# Patient Record
Sex: Female | Born: 1965
Health system: Southern US, Community
[De-identification: ages and names within clinical notes are randomized; demographics above are authoritative.]

## PROBLEM LIST (undated history)

## (undated) DIAGNOSIS — F419 Anxiety disorder, unspecified: Secondary | ICD-10-CM

## (undated) DIAGNOSIS — R112 Nausea with vomiting, unspecified: Secondary | ICD-10-CM

## (undated) DIAGNOSIS — F17200 Nicotine dependence, unspecified, uncomplicated: Secondary | ICD-10-CM

## (undated) DIAGNOSIS — N6310 Unspecified lump in the right breast, unspecified quadrant: Secondary | ICD-10-CM

## (undated) DIAGNOSIS — F319 Bipolar disorder, unspecified: Secondary | ICD-10-CM

## (undated) DIAGNOSIS — Z9889 Other specified postprocedural states: Secondary | ICD-10-CM

## (undated) DIAGNOSIS — E785 Hyperlipidemia, unspecified: Secondary | ICD-10-CM

## (undated) DIAGNOSIS — K219 Gastro-esophageal reflux disease without esophagitis: Secondary | ICD-10-CM

## (undated) HISTORY — DX: Anxiety disorder, unspecified: F41.9

## (undated) HISTORY — PX: LAPAROSCOPIC LYSIS OF ADHESIONS: SHX5905

## (undated) HISTORY — DX: Nausea with vomiting, unspecified: R11.2

## (undated) HISTORY — PX: ABDOMINAL HYSTERECTOMY: SHX81

## (undated) HISTORY — PX: CHOLECYSTECTOMY: SHX55

## (undated) HISTORY — PX: MYOMECTOMY: SHX85

## (undated) HISTORY — DX: Nicotine dependence, unspecified, uncomplicated: F17.200

## (undated) HISTORY — DX: Nausea with vomiting, unspecified: Z98.890

## (undated) HISTORY — PX: PLANTAR FASCIA SURGERY: SHX746

---

## 1998-11-01 ENCOUNTER — Other Ambulatory Visit: Admission: RE | Admit: 1998-11-01 | Discharge: 1998-11-01 | Payer: Self-pay | Admitting: *Deleted

## 1999-11-02 ENCOUNTER — Other Ambulatory Visit: Admission: RE | Admit: 1999-11-02 | Discharge: 1999-11-02 | Payer: Self-pay | Admitting: *Deleted

## 2000-03-22 ENCOUNTER — Emergency Department (HOSPITAL_COMMUNITY): Admission: EM | Admit: 2000-03-22 | Discharge: 2000-03-23 | Payer: Self-pay | Admitting: Emergency Medicine

## 2000-03-25 ENCOUNTER — Encounter: Payer: Self-pay | Admitting: Emergency Medicine

## 2000-03-25 ENCOUNTER — Ambulatory Visit (HOSPITAL_COMMUNITY): Admission: RE | Admit: 2000-03-25 | Discharge: 2000-03-25 | Payer: Self-pay | Admitting: Emergency Medicine

## 2000-04-10 ENCOUNTER — Encounter: Payer: Self-pay | Admitting: General Surgery

## 2000-04-11 ENCOUNTER — Observation Stay (HOSPITAL_COMMUNITY): Admission: RE | Admit: 2000-04-11 | Discharge: 2000-04-12 | Payer: Self-pay | Admitting: General Surgery

## 2000-04-11 ENCOUNTER — Encounter (INDEPENDENT_AMBULATORY_CARE_PROVIDER_SITE_OTHER): Payer: Self-pay | Admitting: Specialist

## 2000-11-07 ENCOUNTER — Encounter: Admission: RE | Admit: 2000-11-07 | Discharge: 2000-11-07 | Payer: Self-pay | Admitting: Internal Medicine

## 2000-11-07 ENCOUNTER — Encounter: Payer: Self-pay | Admitting: Internal Medicine

## 2000-11-11 ENCOUNTER — Ambulatory Visit (HOSPITAL_BASED_OUTPATIENT_CLINIC_OR_DEPARTMENT_OTHER): Admission: RE | Admit: 2000-11-11 | Discharge: 2000-11-11 | Payer: Self-pay | Admitting: Orthopaedic Surgery

## 2000-11-17 ENCOUNTER — Other Ambulatory Visit: Admission: RE | Admit: 2000-11-17 | Discharge: 2000-11-17 | Payer: Self-pay | Admitting: *Deleted

## 2000-11-18 ENCOUNTER — Encounter: Payer: Self-pay | Admitting: Internal Medicine

## 2000-11-18 ENCOUNTER — Encounter: Admission: RE | Admit: 2000-11-18 | Discharge: 2000-11-18 | Payer: Self-pay | Admitting: Internal Medicine

## 2001-10-22 ENCOUNTER — Other Ambulatory Visit: Admission: RE | Admit: 2001-10-22 | Discharge: 2001-10-22 | Payer: Self-pay | Admitting: *Deleted

## 2002-01-03 ENCOUNTER — Emergency Department (HOSPITAL_COMMUNITY): Admission: EM | Admit: 2002-01-03 | Discharge: 2002-01-03 | Payer: Self-pay | Admitting: Emergency Medicine

## 2002-02-05 ENCOUNTER — Ambulatory Visit (HOSPITAL_COMMUNITY): Admission: RE | Admit: 2002-02-05 | Discharge: 2002-02-05 | Payer: Self-pay | Admitting: *Deleted

## 2002-02-05 ENCOUNTER — Encounter (INDEPENDENT_AMBULATORY_CARE_PROVIDER_SITE_OTHER): Payer: Self-pay | Admitting: Specialist

## 2002-05-04 ENCOUNTER — Inpatient Hospital Stay (HOSPITAL_COMMUNITY): Admission: EM | Admit: 2002-05-04 | Discharge: 2002-05-11 | Payer: Self-pay | Admitting: Psychiatry

## 2002-10-14 ENCOUNTER — Other Ambulatory Visit: Admission: RE | Admit: 2002-10-14 | Discharge: 2002-10-14 | Payer: Self-pay | Admitting: *Deleted

## 2003-06-20 ENCOUNTER — Encounter (INDEPENDENT_AMBULATORY_CARE_PROVIDER_SITE_OTHER): Payer: Self-pay | Admitting: Specialist

## 2003-06-20 ENCOUNTER — Ambulatory Visit (HOSPITAL_COMMUNITY): Admission: RE | Admit: 2003-06-20 | Discharge: 2003-06-20 | Payer: Self-pay | Admitting: Obstetrics and Gynecology

## 2003-06-20 ENCOUNTER — Encounter (INDEPENDENT_AMBULATORY_CARE_PROVIDER_SITE_OTHER): Payer: Self-pay | Admitting: *Deleted

## 2003-09-13 ENCOUNTER — Inpatient Hospital Stay (HOSPITAL_COMMUNITY): Admission: AD | Admit: 2003-09-13 | Discharge: 2003-09-13 | Payer: Self-pay | Admitting: *Deleted

## 2003-12-29 ENCOUNTER — Ambulatory Visit: Payer: Self-pay | Admitting: Psychiatry

## 2003-12-29 ENCOUNTER — Inpatient Hospital Stay (HOSPITAL_COMMUNITY): Admission: RE | Admit: 2003-12-29 | Discharge: 2004-01-04 | Payer: Self-pay | Admitting: Psychiatry

## 2004-11-30 ENCOUNTER — Ambulatory Visit (HOSPITAL_COMMUNITY): Admission: RE | Admit: 2004-11-30 | Discharge: 2004-11-30 | Payer: Self-pay | Admitting: Obstetrics and Gynecology

## 2004-11-30 ENCOUNTER — Encounter (INDEPENDENT_AMBULATORY_CARE_PROVIDER_SITE_OTHER): Payer: Self-pay | Admitting: Specialist

## 2006-05-19 ENCOUNTER — Emergency Department (HOSPITAL_COMMUNITY): Admission: EM | Admit: 2006-05-19 | Discharge: 2006-05-19 | Payer: Self-pay | Admitting: Emergency Medicine

## 2006-09-20 ENCOUNTER — Emergency Department (HOSPITAL_COMMUNITY): Admission: EM | Admit: 2006-09-20 | Discharge: 2006-09-20 | Payer: Self-pay | Admitting: Family Medicine

## 2007-05-12 ENCOUNTER — Emergency Department (HOSPITAL_COMMUNITY): Admission: EM | Admit: 2007-05-12 | Discharge: 2007-05-12 | Payer: Self-pay | Admitting: Family Medicine

## 2007-09-09 ENCOUNTER — Encounter (INDEPENDENT_AMBULATORY_CARE_PROVIDER_SITE_OTHER): Payer: Self-pay | Admitting: Obstetrics and Gynecology

## 2007-09-09 ENCOUNTER — Ambulatory Visit (HOSPITAL_COMMUNITY): Admission: RE | Admit: 2007-09-09 | Discharge: 2007-09-10 | Payer: Self-pay | Admitting: Obstetrics and Gynecology

## 2008-01-06 ENCOUNTER — Encounter: Admission: RE | Admit: 2008-01-06 | Discharge: 2008-01-06 | Payer: Self-pay | Admitting: Internal Medicine

## 2008-01-18 ENCOUNTER — Emergency Department (HOSPITAL_COMMUNITY): Admission: EM | Admit: 2008-01-18 | Discharge: 2008-01-18 | Payer: Self-pay | Admitting: Emergency Medicine

## 2008-02-11 ENCOUNTER — Inpatient Hospital Stay (HOSPITAL_COMMUNITY): Admission: EM | Admit: 2008-02-11 | Discharge: 2008-02-18 | Payer: Self-pay | Admitting: Psychiatry

## 2008-02-11 ENCOUNTER — Ambulatory Visit: Payer: Self-pay | Admitting: Psychiatry

## 2008-02-26 ENCOUNTER — Emergency Department (HOSPITAL_COMMUNITY): Admission: EM | Admit: 2008-02-26 | Discharge: 2008-02-27 | Payer: Self-pay | Admitting: Emergency Medicine

## 2008-03-24 ENCOUNTER — Ambulatory Visit: Payer: Self-pay | Admitting: Cardiovascular Disease

## 2008-03-24 LAB — CONVERTED CEMR LAB
Calcium: 9.6 mg/dL (ref 8.4–10.5)
GFR calc Af Amer: 175 mL/min
GFR calc non Af Amer: 145 mL/min
Sodium: 140 meq/L (ref 135–145)

## 2008-03-28 ENCOUNTER — Encounter: Payer: Self-pay | Admitting: Cardiovascular Disease

## 2008-03-28 ENCOUNTER — Ambulatory Visit: Payer: Self-pay

## 2010-03-14 IMAGING — CR DG CHEST 2V
2 series · 2 of 2 positions shown · non-contrast
Comparison: None.

CLINICAL DATA: 42-year-old female cough, history of smoking

CHEST - 2 VIEW

[view not recorded (1 of 2)]
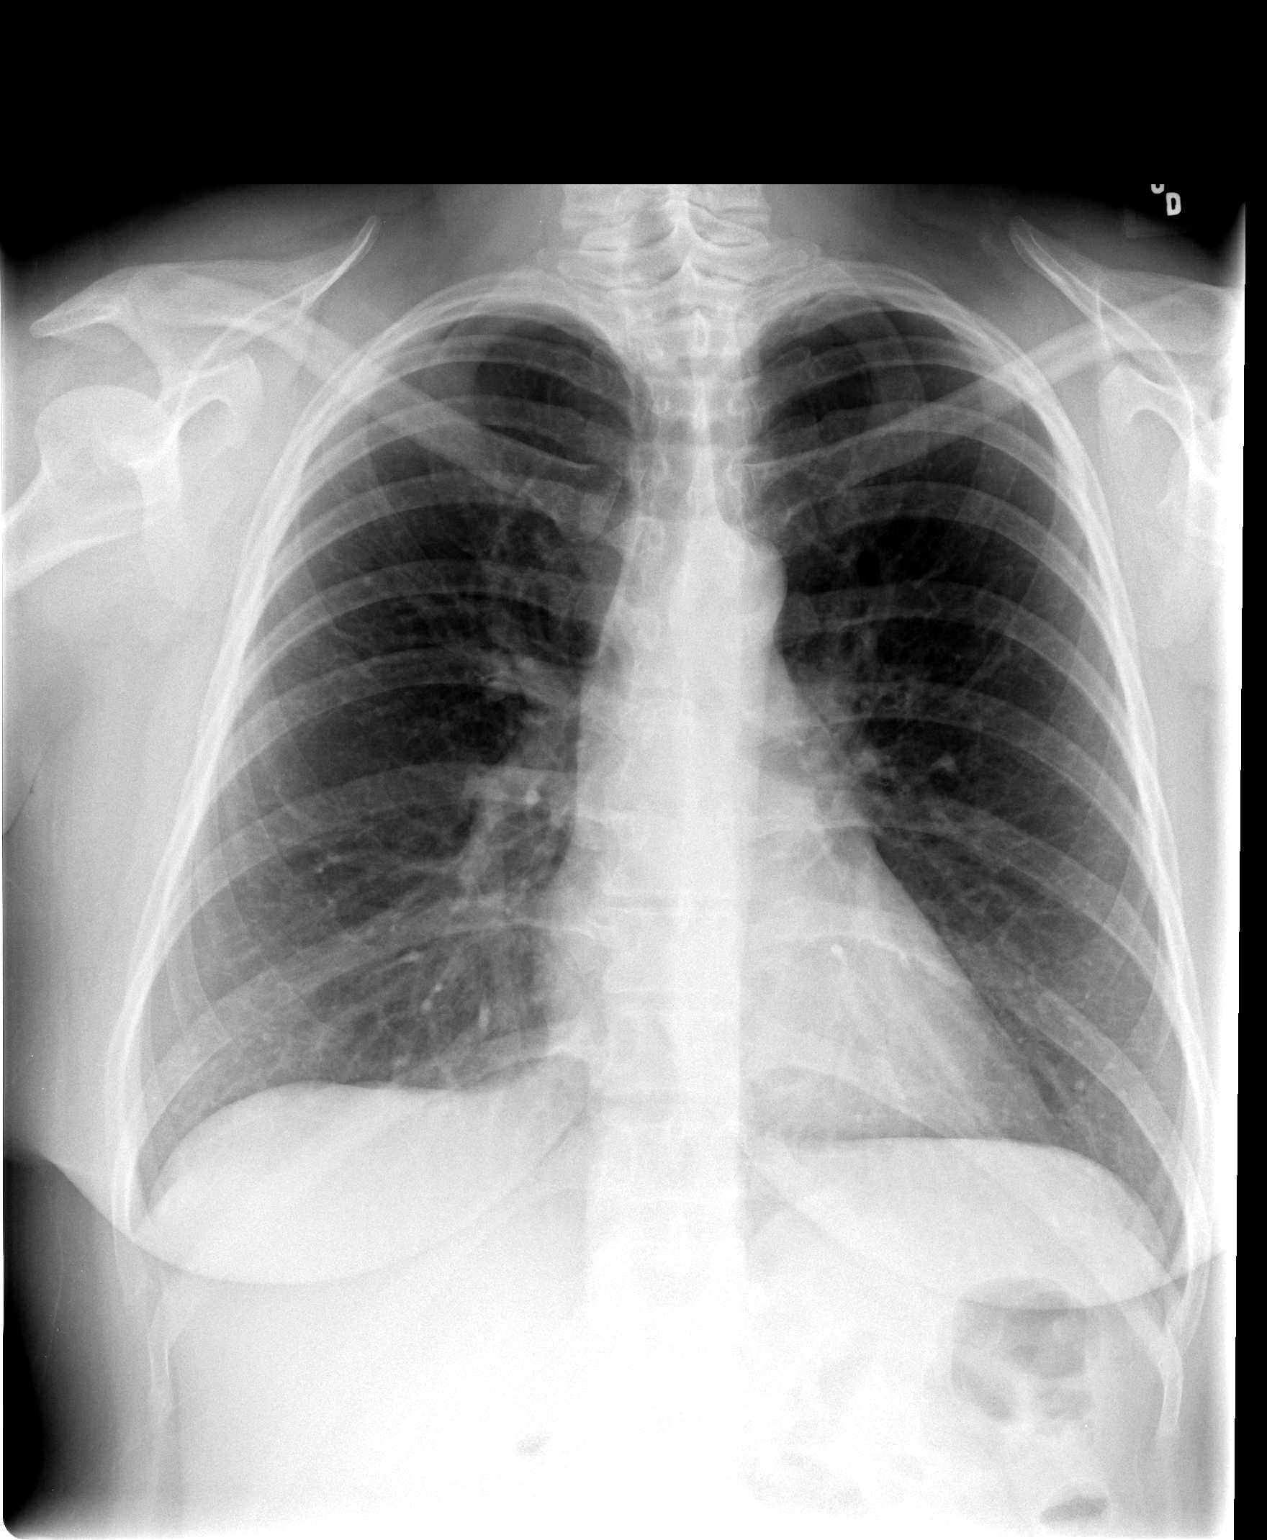

[view not recorded (2 of 2)]
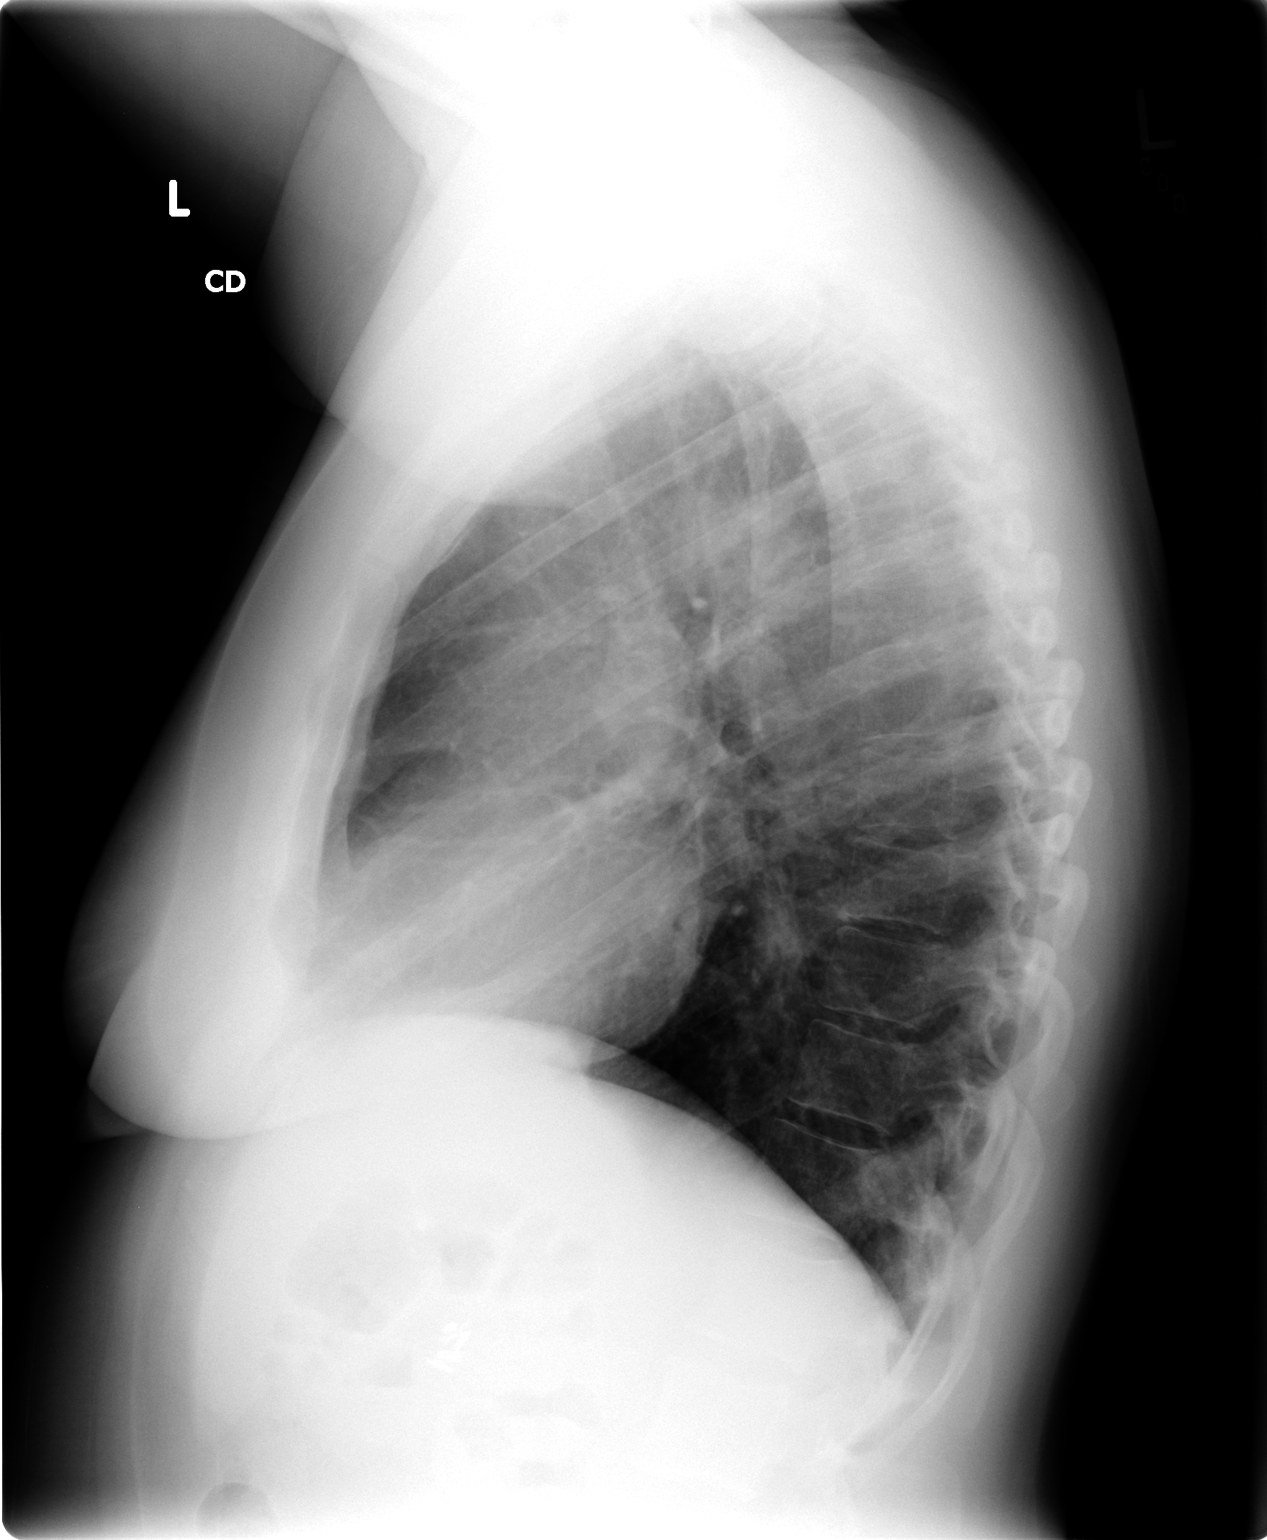

[2 of 2 positions shown; findings below may reference images not displayed]

FINDINGS: The heart size and mediastinal contours are within normal
limits.  Both lungs are clear.  The visualized skeletal structures
are unremarkable. The patient is status post cholecystectomy
IMPRESSION: No active cardiopulmonary disease.

## 2010-04-05 ENCOUNTER — Ambulatory Visit
Admission: RE | Admit: 2010-04-05 | Discharge: 2010-04-05 | Disposition: A | Discharge status: Home / Self Care | Payer: Self-pay | Source: Ambulatory Visit | Attending: Internal Medicine | Admitting: Internal Medicine

## 2010-04-05 ENCOUNTER — Other Ambulatory Visit: Payer: Self-pay | Admitting: Internal Medicine

## 2010-04-05 DIAGNOSIS — R062 Wheezing: Secondary | ICD-10-CM

## 2010-04-05 DIAGNOSIS — R0602 Shortness of breath: Secondary | ICD-10-CM

## 2010-06-04 LAB — POCT I-STAT, CHEM 8
BUN: 7 mg/dL (ref 6–23)
Chloride: 105 mEq/L (ref 96–112)
Creatinine, Ser: 0.8 mg/dL (ref 0.4–1.2)
Hemoglobin: 14.6 g/dL (ref 12.0–15.0)
Potassium: 3.4 mEq/L — ABNORMAL LOW (ref 3.5–5.1)
Sodium: 141 mEq/L (ref 135–145)

## 2010-06-04 LAB — CBC
MCHC: 33.7 g/dL (ref 30.0–36.0)
RBC: 4.64 MIL/uL (ref 3.87–5.11)
WBC: 11.9 10*3/uL — ABNORMAL HIGH (ref 4.0–10.5)

## 2010-06-04 LAB — POCT CARDIAC MARKERS
CKMB, poc: 1.5 ng/mL (ref 1.0–8.0)
Myoglobin, poc: 38.5 ng/mL (ref 12–200)
Troponin i, poc: <0.05 ng/mL (ref 0.00–0.09)

## 2010-07-03 NOTE — Discharge Summary (Signed)
NAMESHATEKA, PETREA NO.:  [id number]   MEDICAL RECORD NO.:  [id number]          PATIENT TYPE:  IPS   LOCATION:  0406                          [facility]ACILITY:  [facility]   PHYSICIAN:  Anselm Jungling, MD  DATE OF BIRTH:  1965/10/28   DATE OF ADMISSION:  02/11/2008  DATE OF DISCHARGE:  02/18/2008                               DISCHARGE SUMMARY   IDENTIFYING DATA AND REASON FOR ADMISSION:  This was an inpatient  psychiatric admission for Chloe [name], a 45 year old married female, who was  admitted due to increasing signs and symptoms of psychosis.  She came to  [place] as a patient of her psychiatrist, Dr. Donell Beers.  Please refer to the  admission note for further details pertaining to the symptoms,  circumstances and history that led to her hospitalization.  She was  given an initial Axis I diagnosis of schizophrenia, chronic paranoid  type, acute exacerbation.   MEDICAL AND LABORATORY:  The patient was in good health without any  active or chronic medical problems.  She was medically and physically  assessed by the psychiatric nurse practitioner.  There were no  significant medical issues.   HOSPITAL COURSE:  The patient was admitted to the adult inpatient  psychiatric service.  She presented as a well-nourished, normally  developed adult female who reported that she was admitted due to arguing  and conflict with her husband.  She harbored, however, severely  delusional ideation regarding her husband being unfaithful to her, which  she admitted that she could not prove, and did not appear to be true,  but which had been the basis for a great deal of ongoing conflict within  the family.   She had been on a psychotropic regimen via Dr. Donell Beers.  Dr. Donell Beers  indicated to [place] that he wanted to pursue Geodon therapy, and as such her  Geodon dose was increased 80 mg b.i.d.  Trazodone was used for bedtime,  and Lamictal was continued at 200 mg q.h.s.  Also, Abilify 40 mg daily  was  continued, and Nexium 40 mg daily for GERD.   The patient's husband was contacted and kept apprised of his wife's  treatment during her inpatient stay, and this was with the patient's  permission.   Although medication was well tolerated throughout her stay, she did not  appear to gain much in the way of insight regarding her illness, and her  delusional beliefs regarding husband being unfaithful changed very  little during her stay.   On the sixth hospital day there was a family session involving the  patient and her husband.  The patient reported that she was doing much  better and wanted to go home.  She indicated that she wanted to stop the  arguing that had been going on between her and her husband.  Also, the  patient's sister was present.  The husband and sister indicated that  they had not had any illicit relationship, and denied the patient's  accusations that they had been.  The patient indicated that she was  beginning to realize that some of her delusional beliefs  could be a part  of her underlying disorder.  She indicated that her thoughts about her  husband and sister having an affair were now intermittent instead of  constant.   The patient's medication treatment was discussed as well as aftercare  needs.  The patient's husband apparently did not feel at that point that  she was ready for discharge.   The patient was ultimately discharged 2 days hence, and agreed to follow-  up as follows.   AFTERCARE AND FOLLOW UP:  The patient was to follow-up with Dr. Donell Beers  with a follow-up appointment on January 25.  Also, she was to follow-up  with a therapist at [facility] on February 25, 2008 a 4:00 p.m.   DISCHARGE MEDICATIONS:  Geodon 80 mg b.i.d., Desyrel 100 mg q.h.s.,  Nexium 40 mg daily, Lamictal 200 mg q.h.s., and Abilify 40 mg daily.   DISCHARGE DIAGNOSES:  AXIS I: Schizophrenia, chronic paranoid type,  acute exacerbation, improving.  AXIS II: Deferred.   AXIS III: History of gastroesophageal reflux disease.  AXIS IV: Stressors severe.  AXIS V: GAF on discharge 55.      Anselm Jungling, MD  Electronically Signed     SPB/MEDQ  D:  02/21/2008  T:  02/21/2008  Job:  [id number]

## 2010-07-03 NOTE — Assessment & Plan Note (Signed)
[facility]                            CARDIOLOGY OFFICE NOTE   NAME:Chloe [name], Chloe [name]                     MRN:          [id number]  DATE:03/24/2008                            DOB:          1965/12/11    REFERRING PHYSICIAN:  [facility]   PRIMARY CARE PHYSICIAN:  Merlene Laughter. Renae Gloss, MD   HISTORY OF PRESENT ILLNESS:  Ms. Chloe [name] is a pleasant 45 year old  Caucasian female with a history of schizophrenic chronic paranoid type  with recent discharge from the inpatient psychiatric unit on February 18, 2008, as well as a history of bipolar disorder, anxiety,  hyperlipidemia, chronic back pain, and GERD who presents today for  further evaluation of episodes of palpitations.  The patient tells me  that she has never had prior cardiac problems.  She notes that over the  last several months, she has had episodes of anxiety that had been  followed by an awareness of palpitations.  She has recently been taking  Xanax as needed for her anxiety episodes and tells me that this seems to  completely resolve the awareness of palpitations.  She has had no  awareness of palpitations during times when she is not anxious.  She  denies having any substernal chest pain, change in her baseline,  shortness of breath with exertion, near syncope, syncope, diaphoresis,  nausea, vomiting, orthopnea, PND, or lower extremity edema.  She has had  no prior cardiac evaluation in the past.  She presented to the [facility] on February 27, 2008, with complaints of  palpitations.  The patient was apparently in sinus rhythm during her  visit in the emergency department.  She did have negative cardiac  enzymes and a chest x-ray that showed no lung disease.  Her TSH was  checked on February 11, 2008, and was in the normal range.  She denies  any recent illicit drug use.  She tells me that she had one beer last  night and continues to smoke one and a half  packs of cigarettes per day.  She does not note any episodes of palpitations over the last 1-2 weeks.   PAST MEDICAL HISTORY:  1. Schizophrenia, chronic paranoid type.  2. Bipolar disorder.  3. Anxiety.  4. Hyperlipidemia.  5. Chronic back pain secondary to herniated disk.  6. Gastroesophageal reflux disease.   PAST SURGICAL HISTORY:  1. Cholecystectomy.  2. Hysterectomy.  3. Removal of the right ovary.  4. Removal of the left fallopian tube.   ALLERGIES:  DEMEROL.   CURRENT MEDICATIONS:  1. Trazodone 100 mg at night.  2. Lamictal 200 mg at night.  3. Abilify 40 mg once daily.  4. Nexium 40 mg once daily.  5. Vitamin D 50,000 units twice weekly.  6. Geodon 80 mg 3 times daily.  7. Multivitamins once daily.   SOCIAL HISTORY:  The patient smokes 1-1/2 packs of cigarettes per day  and has done so for 25 years.  She uses alcohol occasionally, but denies  any illicit drug use over the last 6 months.  She has formerly used  marijuana, but denies any cocaine abuse.  She also denies any  methamphetamine abuse.  She is married and has no children.  She reports  current marital distress.  She does admit to drinking 4 caffeinated  beverages per day.   FAMILY HISTORY:  The patient's mother died from lung cancer.  Her father  is alive and has COPD.  She reports that she has a brother who had heart  attack at age of 85.  She has another sister who is healthy.   REVIEW OF SYSTEMS:  As stated in history of present illness, is  otherwise negative.   PHYSICAL EXAMINATION:  VITAL SIGNS:  Blood pressure 98/80, pulse 73 and  regular, respirations 12 and unlabored.  GENERAL:  She is a pleasant young Caucasian female in no acute distress.  She is alert and oriented x3.  PSYCHIATRIC:  Affect is flat.  Mood is appropriate.  SKIN:  Warm and dry.  HEENT:  Normal musculoskeletal, muscle strength, and tone is normal.  NEUROLOGICAL:  No focal neurological deficits.  NECK:  No JVD.  No carotid  bruits.  No thyromegaly.  No lymphadenopathy.  LUNGS:  Clear to auscultation bilaterally without wheezes, rhonchi, or  crackles noted.  CARDIOVASCULAR:  Regular rate and rhythm without murmurs, gallops, or  rubs noted.  ABDOMEN:  Soft, nontender, nondistended.  Bowel sounds are present.  EXTREMITIES:  No evidence of edema.  Pulses are 2+ in all extremities.   DIAGNOSTIC STUDIES.:  1. A 12-lead EKG obtained in our office today shows normal sinus      rhythm with a ventricular rate of 73 beats per minute.  There are      no ischemic changes noted.  The QT interval was corrected at 414      milliseconds.  2. TSH performed on February 11, 2008, was 1.82.  Chemistry profile      performed on February 26, 2008, in the emergency department showed a      potassium of 3.4, creatinine 0.8.   ASSESSMENT/PLAN:  This is a pleasant 45 year old Caucasian female with  history of schizophrenia, as well as bipolar disorder who presents with  complaints of palpitations during episodes of anxiety.  The symptoms do  not sound consistent with a cardiac arrhythmia.  The episodes of anxiety  occur during times of stress when she is arguing with her husband  generally.  The palpitations have not made her feel dizzy and she has  had no syncopal episodes.  I think it would be worthwhile in this  patient to obtain an echocardiogram to rule out any structural heart  disease.  I have encouraged her to stop smoking and I have asked her to  cut back on her intake of caffeinated beverages.  She is also encouraged  to avoid any illicit substances.  We will check a metabolic profile  today since her potassium was low when it was last checked 1 month ago.  We will plan on seeing her back in our office in 4 weeks to review the  results of her echocardiogram.  If there are any lab abnormalities, we  will call her with those results and make necessary changes at that  time.     Verne Carrow, MD  Electronically  Signed    CM/MedQ  DD: 03/24/2008  DT: 03/25/2008  Job #: [id number]   cc:   Merlene Laughter. Renae Gloss, M.D.

## 2010-07-03 NOTE — Op Note (Signed)
Chloe [name], Chloe [name]              ACCOUNT NO.:  [id number]   MEDICAL RECORD NO.:  [id number]          PATIENT TYPE:  OIB   LOCATION:  9306                          FACILITY:  [facility]   PHYSICIAN:  Lenoard Aden, M.D.DATE OF BIRTH:  May 03, 1965   DATE OF PROCEDURE:  09/09/2007  DATE OF DISCHARGE:  09/10/2007                               OPERATIVE REPORT   PREOPERATIVE DIAGNOSES:  1. Pelvic pain.  2. Worsening left lower quadrant pain.  3. Dysmenorrhea.  4. Menorrhagia.   POSTOPERATIVE DIAGNOSES:  1. Pelvic pain.  2. Worsening left lower quadrant pain.  3. Dysmenorrhea.  4. Menorrhagia.  5. Enterocele.  6. Pelvic adhesions.   PROCEDURE:  1. Diagnostic laparoscopy.  2. Total laparoscopic hysterectomy.  3. Lysis of adhesions.  4. Left salpingo-oophorectomy.  5. Right salpingectomy.  6. McCall culdoplasty.   SURGEON:  Lenoard Aden, MD   ASSISTANT:  __________   ANESTHESIA:  General.   ESTIMATED BLOOD LOSS:  Less than 50 mL.   COMPLICATIONS:  None.   DRAINS:  Foley.   COUNTS:  Correct.   The patient to recovery in good condition.   SPECIMEN:  Uterus, cervix, left tube and ovary, and right tube.   BRIEF OPERATIVE NOTE:  After being apprised of risks of anesthesia,  infection, bleeding, intra-abdominal injury, delayed versus immediate  complications to include bowel and bladder injury.  The patient was  brought to the operating room where she was administered general  anesthetic without complications, prepped and draped in usual sterile  fashion.  Foley catheter was placed.  RUMI tenaculum/retractor placed  per vagina in a standard fashion and occluder was insufflated without  difficulty.  At this time, infraumbilical incision was made with a  scalpel.  Veress needle was placed.  Opening pressure -2 noted.  Four  liters CO2 insufflated without difficulty.  Normal trocar entry was  visualized.  There was an omental adhesion to the anterior abdominal  wall,  which did not contain bowel and there was no evidence of bowel  perforation, normal liver, gallbladder bed, and normal appendiceal area.  There were left adnexal adhesions to the left ovary from the omentum and  left sigmoid epiploica.  At this time, two 5-mm trocar sites were made  in the left and right lower quadrants under direct visualization and  transillumination without difficulty.  The adhesions were lysed sharply  using EndoShears and a Gyrus grasper exposing the infundibulopelvic  ligament.  Ureter was identified on the left.  The beds of the ligament  was cauterized using the Gyrus and the ovary was freed and dissected  down to the level of the tubo-ovarian round ligament and separated and  placed in the cul-de-sac.  The round ligament was then cauterized and  divided using the Gyrus.  Retroperitoneal entry was made.  Uterine  vessels were skeletonized bilaterally.  The bladder flap was developed  sharply using EndoShears and sharp and blunt dissection with the  graspers.  At this time, the right round ligament was identified due to  the absence of the right ovary.  This was not addressed at  this time.  Therefore, the round ligament was divided and opened.  Retroperitoneal  space was exposed.  Uterine vessels were skeletonized and divided using  the Gyrus.  Good hemostasis achieved.  Bladder flap was sharply  developed and the KOH Cup rim was palpated circumferentially along the  cervicovaginal junction.  This area was cauterized using the spatula  without difficulty.  Good hemostasis achieved.  The specimens then  retracted into the vagina along with the ovary.  At this time, the  vagina was then closed using the 0 Quill suture in a standard running  fashion with a Lapra-Ty placed at the proximal end.  The sutures were  met in the midline.  Good hemostasis was noted.  Vaginal closure was  reassured by placement of the examining hand in the vagina, which  revealed good vaginal  vault closure.  Cul-de-sac was obliterated using a  one running 0 Quill suture across the posterior cul-de-sac in standard  [place] fashion, internal cul-de-sac sutures were placed.  Ureters being  identified bilaterally before placement of the suture.  Good hemostasis  was noted.  Irrigation was accomplished.  CO2 was released and  irrigation needed to be accomplished.  Good hemostasis was confirmed  upon removal of the pneumoperitoneum.  At this time, all instruments  removed under direct visualizations.  The incisions were closed using 2-  0 Vicryl, 4-0 Vicryl, and Dermabond.  All specimens removed from the  vagina.  The patient was awakened and transferred to recovery in good  condition.      Lenoard Aden, M.D.  Electronically Signed     RJT/MEDQ  D:  09/09/2007  T:  09/10/2007  Job:  [id number]

## 2010-07-06 NOTE — Discharge Summary (Signed)
Chloe [name], Chloe [name]                        ACCOUNT NO.:  [id number]   MEDICAL RECORD NO.:  [id number]                   PATIENT TYPE:  IPS   LOCATION:  0407                                 [facility]ILITY[facility]  [facility]   PHYSICIAN:  Geoffery Lyons, M.D.                   DATE OF BIRTH:  [birth year]   DATE OF ADMISSION:  05/04/2002  DATE OF DISCHARGE:  05/11/2002                                 DISCHARGE SUMMARY   CHIEF COMPLAINT AND PRESENTING ILLNESS:  This was the first admission to  [facility]  for this 45 year old white female, married,  voluntarily admitted.  Was referred by her psychotherapist due to 2 weeks of  increasingly erratic behavior.  Change in behavior for the last 2 or 3  months, off the medications over the past year, increasingly paranoid,  agitated, locking the doors, repeatedly going through bills, feeling that  employer is checking on her background, pressures basically fear of being  laid off, and that the sister has miscarriages.  She states she has been  taking Xanax to sleep.   PAST PSYCHIATRIC HISTORY:  Dr. Donell Beers.  First time [employer].  Stable on medications for 6 to 7 years, last hospitalization 1992.  Previously at [facility] [place] [facility] [place] [facility].   ALCOHOL AND DRUG HISTORY:  Denies the use or abuse of any substances other  than marijuana 2 times a week.   PAST MEDICAL HISTORY:  Arterial hypertension.   MEDICATIONS:  Xanax, Depakote, Lotensin 10-12.5.   PHYSICAL EXAMINATION:  Performed, failed to show any acute findings.   LABORATORY DATA:  CBC and CMET were within normal limits.   MENTAL STATUS EXAM:  Reveals a medium-build female, pacing, constantly  moving, claiming to have a lot of energy, unable to sleep for any length of  time, sometimes guarded but cooperative.  Speech hyperverbal, pressured,  rapid.  Mood anxious, agitated.  Thoughts tangential, disorganized,  grandiose, paranoia.  No acute suicidal ideation.   Cognition well preserved.   ADMISSION DIAGNOSES:   AXIS I:  1. Bipolar disorder.  2. Marijuana abuse.   AXIS II:  No diagnosis.   AXIS III:  Arterial hypertension.   AXIS IV:  Moderate.   AXIS V:  Global assessment of function upon admission 25, highest global  assessment of function in past year 70.   LABORATORY DATA:  Blood chemistries were within normal limits.  Thyroid  profile within normal limits.   COURSE IN HOSPITAL:  She was admitted and started on intensive individual  and group psychotherapy.  She was given some Librium as needed.  She was  placed on Zyprexa 10 and Ativan as needed for anxiety and agitation.  She  was detoxified using the Librium protocol.  She was placed on Depakote.  Zyprexa was increased to 5 at noontime and 10 at night.  She was given some  Ambien for sleep.  Zydis was increased to 5 at noontime and 15 at bedtime.  Depakote was increased up to 1000 mg.  As the medication got in her system,  she started feeling better.  She claimed toward the end that as the  medication was increased she was more paranoid as the medication was making  her tired.  She would like to rest.  On March 23, she felt she was ready to  go home.  She met with her husband and brother and felt that they were both  understanding.  She was reassured that she was going to do okay.  So as she  was in full contact with reality, there were no suicidal or homicidal ideas,  we went ahead and discharged to outpatient follow-up.   DISCHARGE DIAGNOSES:   AXIS I:  1. Bipolar disorder, manic.  2. Marijuana abuse.   AXIS II:  No diagnosis.   AXIS III:  Arterial hypertension.   AXIS IV:  Moderate.   AXIS V:  Global assessment of function upon discharge 55.   DISCHARGE MEDICATIONS:  1. Zyprexa Zydis 5 in the morning, and 15 at night.  2. Depakote ER 500 2 at bedtime.   DISPOSITION:  Follow up with Dr. Donell Beers and Jerral Bonito.                                                 Geoffery Lyons, M.D.    IL/MEDQ  D:  06/16/2002  T:  06/17/2002  Job:  [id number]

## 2010-07-06 NOTE — H&P (Signed)
Chloe [name], Chloe [name]                        ACCOUNT NO.:  [id number]   MEDICAL RECORD NO.:  [id number]                   PATIENT TYPE:  AMB   LOCATION:  SDC                                  F[facility]ILITY:  [facility]   PHYSICIAN:  Lenoard Aden, M.D.             DATE OF BIRTH:  September 04, 1965   DATE OF ADMISSION:  06/20/2003  DATE OF DISCHARGE:                                HISTORY & PHYSICAL   CHIEF COMPLAINT:  Pelvic pain and dysfunctional uterine bleeding.   HISTORY OF PRESENT ILLNESS:  The patient is a 45 year old white female, G1,  P0.  She has a history of uterine fibroids, history of unilateral  oophorectomy, and questionable lysis of adhesions at the time of her  previous laparoscopy.   Medications include Lotensin and Xanax.   Medical problems to include hypertension and anxiety.   PAST SURGICAL HISTORY:  Remarkable for laparoscopy, questionable history of  myomectomy, lysis of adhesions, cholecystectomy, and surgical treatment for  plantar fasciitis.   FAMILY HISTORY:  Breast cancer, heart disease, hypertension, diabetes, and  lung cancer.   PHYSICAL EXAMINATION:  GENERAL:  She is a well-developed, well-nourished  white female in no acute distress.  HEENT:  Normal.  CHEST:  Lungs clear.  CARDIAC:  Regular rhythm.  ABDOMEN:  Soft, nontender.  PELVIC:  A bulky enlarged uterus and no adnexal masses.  EXTREMITIES:  No cords.  NEUROLOGIC:  Nonfocal.   IMPRESSION:  1. Dysfunctional uterine bleeding with structural lesion seen on saline     sonohysterography.  2. Pelvic pain with a history of pelvic adhesive disease.   PLAN:  Proceed with diagnostic hysteroscopy, resectoscopic myomectomy,  diagnostic laparoscopy, possible lysis of adhesions.  Risks of anesthesia,  infection, bleeding, injury to abdominal organs and need for repair is  discussed.  Delayed versus immediate complications to include bowel and  bladder injury are noted.  Due to previous history of unilateral  oophorectomy, the patient is recommended to consider conservative therapy at  this time rather than removal of her other ovary.  She acknowledges and  wishes to proceed with conservative therapy.                                               Lenoard Aden, M.D.   RJT/MEDQ  D:  06/19/2003  T:  06/20/2003  Job:  [id number]

## 2010-07-06 NOTE — Discharge Summary (Signed)
Chloe [name], REPSHER NO.:  [id number]   MEDICAL RECORD NO.:  [id number]          PATIENT TYPE:  IPS   LOCATION:  0305                          FACILITY:  [facility]   PHYSICIAN:  Jeanice Lim, M.D. DATE OF BIRTH:  02-07-1966   DATE OF ADMISSION:  12/29/2003  DATE OF DISCHARGE:  01/04/2004                                 DISCHARGE SUMMARY   IDENTIFYING DATA:  This is a 45 year old, married, Caucasian female,  voluntarily admitted.  Presenting with increased depressive symptoms and  suicidal thoughts for weeks.  After discharge from [facility] in July,  husband had committed her then, and felt that she had gone down hill since  that time.  She had deterioration of ADLs, job was stressful, works night,  appetite loss, history of hallucinations and sleeping three hours per night.   PAST PSYCHIATRIC HISTORY:  Has been seen with Dr. Donell Beers and hospitalized  at [facility] two times and [facility] with a history of bipolar disorder and a  previous overdose of Tylenol No. 3 10 years ago.  This is the fourth  hospitalization at [facility].   MEDICATIONS:  Vasotec, Depakote, Risperdal.   ALLERGIES:  No known drug allergies.   PHYSICAL EXAMINATION:  Physical exam and neurological exam essentially  within normal limits.   LABORATORY DATA:  Routine admission labs essentially within normal limits.   MENTAL STATUS EXAM:  Alert, oriented, cooperative.  Good eye contact.  Speech clear.  Depressed affect.  Thought processes goal-directed.  Cognitively intact.  Judgment and insight fair.   ADMITTING DIAGNOSES:   AXIS I:  Bipolar disorder, depressed phase.   AXIS II:  Deferred.   AXIS III:  Hypertension.   AXIS IV:  Moderate stressors related to occupation and other psychosocial  problems.   AXIS V:  25/60.   HOSPITAL COURSE:  The patient was admitted and ordered routine p.r.n.  medications and underwent further monitoring.  Was encouraged to participate  in individual, group and  milieu therapies.  The patient was monitored for  safety.  Reported being tired of feeling this way.  Has fleeting suicidal  thoughts.  Described worsening of depressive symptoms for three weeks and  felt that potentially improved since starting on Lexapro and Depakote.  Had  stopped Seroquel and Risperdal and voices had worsened, which would say,  You screw up.  Family meeting was requested.  Patient was further  stabilized on medications.  Reported feeling gradually more up beat with a  decrease in depressive symptoms and tolerating medications.  Patient  reported improvement with medications.  Discharged significantly improved.  Mood was euthymic.  Affect bright.  No dangerous ideation or psychotic  symptoms.  Patient reported motivation to be compliant with the aftercare  plan.   DISCHARGE MEDICATIONS:  Patient was given mediation education, including the  importance of adherence to medications and reporting side effects to  outpatient physician and patient was discharged on:  1.  Depakote ER 500 mg q.a.m. and q.h.s.  2.  Depakote ER 250 mg one q.h.s.  3.  Lamictal 25 mg every other day to titrate  after two weeks to one every      day.  Patient was warned of rash and to stop medication and call      physician if this occurs.  4.  Risperdal 0.5 mg one q.h.s.  5.  Lexapro 10 mg one daily.  6.  Trazodone 50 mg one half q.h.s.  7.  Vasotec 5 mg one daily, as previously prescribed by primary care      physician.   FOLLOW UP:  The patient is to follow-up with [facility], [facility] [place].  Has an appointment on November 17th at 9:00 a.m. and November 21st at  12.   DISCHARGE DIAGNOSES:   AXIS I:  Bipolar disorder, depressed phase.   AXIS II:  Deferred.   AXIS III:  Hypertension.   AXIS IV:  Moderate stressors related to occupation and other psychosocial  problems.   AXIS V:  Global Assessment of Functioning on discharge was 55.     Jame   JEM/MEDQ  D:   01/29/2004  T:  01/30/2004  Job:  [id number]

## 2010-07-06 NOTE — Op Note (Signed)
[place]  Patient:    Chloe [name], Chloe [name] Visit Number: [id number] MRN: [id number]          Service Type: DSU Location: [facility] Attending Physician:  Marcene Corning Dictated by:   Lubertha Basque. Jerl Santos, M.D. Proc. Date: 11/11/00 Admit Date:  11/11/2000                             Operative Report  PREOPERATIVE DIAGNOSIS: 1. Left foot plantar fasciitis. 2. Ingrown toenails both feet.  POSTOPERATIVE DIAGNOSIS: 1. Left foot plantar fasciitis. 2. Ingrown toenails both feet.  OPERATION PERFORMED: 1. Left foot plantar fascial release. 2. Removal of toenails left one and two and right one, two and five.  ANESTHESIA:  General.  ATTENDING SURGEON:  Lubertha Basque. Jerl Santos, M.D.  ASSISTANT:  Lindwood Qua, P.A.  INDICATIONS FOR PROCEDURE:  The patient is a 45 year old woman with a long history of left foot plantar fasciitis.  She has failed physical therapy, injection x 5, orthotics and oral anti-inflammatories.  At this point she is offered operative intervention to consist of a plantar fascial release.  The procedure was discussed with the patient and informed operative consent was obtained after discussion of possible complications of reaction to anesthesia and infection.  The success rate of only 60% was also discussed with the patient in some detail.  She had some bilateral ingrown toenails and wished to have some of these nails addressed as well.  DESCRIPTION OF PROCEDURE:  The patient was taken to an operating suite where general anesthetic was applied without difficulty.  She was then positioned supine and prepped and draped in normal sterile fashion.  After administration of preop intravenous antibiotics, two small incisions were made about the left heel.  The endoscope was then placed and the plantar fascia visualized.  A hook blade was used to release the plantar fascia under direct visualization. She did have some bleeding and pressure was  held on the incisions until the bleeding ceased.  Her toenails were then addressed.  We removed from the left foot, the toenails from her first and second toes and from the right foot the toenails from the first, second and fifth toes.  I used some phenol to perform partial nail bed ablation of the two  great toe nail beds as these were severely ingrown.  The wounds were then all thoroughly irrigated followed by placement of Adaptic.  Simple sutures of nylon were used to reapproximate the heel wounds.  A dry gauze was applied with loose Ace wraps on both sides. Estimated blood loss and intraoperative fluids can be obtained from Anesthesia records.  No tourniquets were used.  DISPOSITION:  The patient was extubated in the operating room and taken to the recovery room in stable condition.  Plans were for her to go home the same day and to follow up in the office in less than a week.  I will contact her by phone tonight. Dictated by:   Lubertha Basque Jerl Santos, M.D. Attending Physician:  Marcene Corning DD:  11/11/00 TD:  11/11/00 Job: [id number]

## 2010-07-06 NOTE — H&P (Signed)
Chloe [name], Chloe [name]              ACCOUNT NO.:  [id number]   MEDICAL RECORD NO.:  [id number]          PATIENT TYPE:  AMB   LOCATION:  SDC                           [facility]ACILITY:  [facility]   PHYSICIAN:  Lenoard Aden, M.D.DATE OF BIRTH:  September 30, 1965   DATE OF ADMISSION:  11/30/2004  DATE OF DISCHARGE:                                HISTORY & PHYSICAL   CHIEF COMPLAINT:  Pelvic pain.   HISTORY OF PRESENT ILLNESS:  She is a 45 year old white female, gravida 1,  para 0, with a history of uterine fibroids, unilateral oophorectomy, and  lysis of adhesions at the time of previous laparoscopy done.  She had this  surgery done in 2003 at which time she had hysteroscopy with resection of a  posterior endometrial mass and in 1996, diagnostic laparoscopy with  extensive lysis of adhesions.  She then had follow-up in May of 2005 with a  diagnostic laparoscopy, lysis of adhesions, and resectoscopic myomectomy,  left ovarian cystectomy.  She continues with pain and discomfort at this  time.  She does have a history of unilateral oophorectomy, right  oophorectomy done on previous occasion.  She now has persistent left-sided  pain and dyspareunia and she desires definitive therapy in the form of left  salpingectomy give the previous absence of her other ovary.   ALLERGIES:  No known drug allergies.   MEDICATIONS:  1.  Enalapril for hypertension.  2.  Lexapro for depression.  3.  Lamictal and Uritol.   She has also known anxiety.   FAMILY HISTORY:  Breast cancer, heart disease, hypertension, diabetes, and  lung cancer.  She is a 1-1/2 pack per day smoker.  She denies domestic or  physical violence.   PHYSICAL EXAMINATION:  GENERAL:  She is a well-developed, well-nourished,  white female in no acute distress.  VITAL SIGNS:  Blood pressure 118/80.  HEENT:  Normal.  LUNGS:  Clear.  HEART:  Regular rate and rhythm.  ABDOMEN:  Soft and nontender.  PELVIC:  Retroflexed uterus.  No adnexal masses.  EXTREMITIES:  No cords.  NEUROLOGY:  Nonfocal.  Left adnexa on ultrasound reveals evidence of a 3.5  cm tubular structure consistent more likely with a left hydrosalpinx which  was also noted on the previous laparoscopy.   IMPRESSION:  Left lower quadrant pain with dyspareunia with known  hydrosalpinx and chronic pelvic inflammatory disease.   PLAN:  Proceed with diagnostic laparoscopy, left salpingectomy.  Risks of  anesthesia, infection, bleeding, injury to abdominal organs with need for  repair is discussed, delayed versus immediate complications to include bowel  and bladder injury noted with possible recurrence of pain noted.  The  patient acknowledges and wishes to proceed.      Lenoard Aden, M.D.  Electronically Signed     RJT/MEDQ  D:  11/29/2004  T:  11/30/2004  Job:  [id number]

## 2010-07-06 NOTE — Op Note (Signed)
Chloe [name], Chloe [name]                        ACCOUNT NO.:  [id number]   MEDICAL RECORD NO.:  [id number]                   PATIENT TYPE:  AMB   LOCATION:  SDC                                  [facility]ACILITY:  [facility]   PHYSICIAN:  Lenoard Aden, M.D.             DATE OF BIRTH:  09-02-1965   DATE OF PROCEDURE:  06/20/2003  DATE OF DISCHARGE:                                 OPERATIVE REPORT   CHIEF COMPLAINT:  Dysfunctional uterine bleeding and pelvic pain.   POSTOPERATIVE DIAGNOSES:  1. Pelvic adhesions.  2. Submucous fibroid.  3. Omental adhesions.  4. Left ovarian cyst.  5. Right adnexal adhesions.   PROCEDURES:  1. Diagnostic hysteroscopy.  2. Resectoscopic myomectomy.  3. Dilatation and curettage.  4. Diagnostic laparoscopy with lysis of adhesions.  5. Left ovarian cystectomy.   SURGEON:  Lenoard Aden, M.D.   ANESTHESIA:  General.   ESTIMATED BLOOD LOSS:  Less than 50 mL.   COMPLICATIONS:  None.   FLUID DEFICIT:  60 mL.   COUNTS:  Correct.   Patient to recovery in good condition.   BRIEF OPERATIVE NOTE:  After being apprised of the risks of anesthesia,  infection, bleeding, injury to abdominal organs and need for repair, delayed  versus immediate complications to include bowel and bladder injury,  inability to cure pelvic pain, the patient was brought to the operating  room, where she was administered a general anesthetic without complications,  prepped and draped in the usual sterile fashion, and catheterized until the  bladder is empty.  Exam under anesthesia reveals a midposition to  retroflexed uterus, no adnexal masses.  A weighted speculum placed, single-  tooth tenaculum placed, dilute Pitressin solution placed at 3 and 9 o'clock  at the cervicovaginal junction.  The cervix easily dilated up to a #31 Pratt  dilator.  Hysteroscope placed.  Visualization reveals a large anterior  submucosal fibroid, which is resected in multiple passes down to the edge of  the cavity.  Bilateral normal tubal ostia are noted.  D&C is performed.  Revisualization reveals normalization of cavity, no evidence of any other  intracavitary lesions.  At this time the bladder was emptied using a red  rubber catheter, which was also done pre-hysteroscopy.  A fluid deficit of  60 mL is noted.  A Hulka tenaculum placed per vagina, infraumbilical  incision made with a scalpel.  After placement of a dilute Marcaine  solution, Veress needle placed.  Opening pressure of -2 noted.  Three liters  of CO2 insufflated without difficulty, trocar placed.  Laparoscopic  placement reveals atraumatic trocar entry.  Negative liver, gallbladder bed.  No evidence of appendix or periappendiceal adhesions.  There are omental  adhesions to the anterior abdominal wall, which are dissected using  operative port monopolar scissors without difficulty after noting the bowel  was nowhere near the anterior abdominal wall.  At this time the right adnexa  is  noted and the remaining right tube and absent right ovary are noted;  however, the right tube is adhesed to the pelvic sidewall.  These are lysed  sharply using monopolar cautery with use of an atraumatic grasper through an  auxiliary-placed left lower quadrant port, which was placed under dilute  Marcaine solution with a stab incision and 5 mm trocar placed without  difficulty.  At this time attention is then turned to the left adnexa, where  there are bowel adhesions from the bowel mesentery to the left adnexa.  These are lysed close to the left ovary sharply, and no evidence of  interrupted of bowel serosal integrity.  Good hemostasis is noted.  There is  no what appears to be a large left ovarian cyst in conjunction with the  hydrosalpinx.  This is filled with follicular fluid, which is aspirated and  sent for cytology.  The cystic mass is then opened and removed in its  entirety using monopolar scissors, dissected sharply from the left  ovary,  which appears otherwise within normal limits.  This specimen of cyst wall is  taken and removed and sent to pathology for confirmation of its benign  nature.  Good hemostasis is noted.  The cyst bed is cauterized.  The rest of  the left tube appears to be normal in caliber.  The right tube appears to be  normal with a normal fimbriated end.  The left tube, however, appears to be  clubbed and adhesed into the right tubo-ovarian area.  At this time good  hemostasis is noted.  All instruments are removed under direct visualization  and CO2 is released.  Trocar sites are closed using a 0 Vicryl and  Dermabond.  Instruments are removed from the vagina.  The patient is  awakened and transferred to recovery in good condition.                                               Lenoard Aden, M.D.    RJT/MEDQ  D:  06/20/2003  T:  06/20/2003  Job:  [id number]

## 2010-07-06 NOTE — Op Note (Signed)
[name], [name]              ACCOUNT NO.:  [id number]   MEDICAL RECORD NO.:  [id number]          PATIENT TYPE:  AMB   LOCATION:  SDC                           [facility]ILITY:  [facility]   PHYSICIAN:  Lenoard Aden, M.D.DATE OF BIRTH:  1965-12-16   DATE OF PROCEDURE:  11/30/2004  DATE OF DISCHARGE:                                 OPERATIVE REPORT   CHIEF COMPLAINT:  1.  Pelvic pain.  2.  Dyspareunia.  3.  Left hydrosalpinx.   POSTOPERATIVE DIAGNOSES:  1.  Pelvic adhesions.  2.  Left hydrosalpinx.   PROCEDURES:  1.  Diagnostic laparoscopy.  2.  Lysis of adhesions.  3.  Left salpingectomy.   SURGEON:  Lenoard Aden, M.D.   ASSISTANT:  Genia Del, M.D.   ANESTHESIA:  General by Raul Del, M.D.   ESTIMATED BLOOD LOSS:  Less than 50 mL.   COMPLICATIONS:  None.   DRAINS:  None.   COUNTS:  Correct.   Patient to recovery in good condition.   SPECIMENS:  Left hydrosalpinx and tube to pathology.   BRIEF OPERATIVE NOTE:  After being apprised of the risks and benefits of the  procedure including infection, bleeding, injury to intra-abdominal organs  and need for repair, the patient was brought to the operating room, where  she was administered a general anesthetic without complications, prepped and  draped in the usual sterile fashion, a Foley catheter placed.  After  achieving adequate anesthesia, dilute Marcaine solution placed,  infraumbilical incision made with a scalpel, fascia identified, opened,  pursestring suture placed, peritoneal entry made, Hasson trocar placed, 3 L  CO2 insufflated without difficulty.  Normal liver, gallbladder bed,  atraumatic noted, normal appendiceal area noted.  There are dense adhesions  of the left bowel and mesosigmoid mesentery to the left tube and ovary.  There is a large left hydrosalpinx, absent right ovary and normal left  ovary, and right tube appears within normal limits.  Normal uterus.  Normal  anterior and  posterior cul-de-sac.  At this time two 5 mm trocar sites made  in the midline and in the left lower quadrant under transillumination,  atraumatic placement noted after stab incision with a scalpel.  At this time  the hydrosalpinx is traced out to the fimbriated end, adhesions are lysed  from the bowel mesentery to the left tube and ovary with the tripolar  cautery, noting the bowel to be well out of the presence of the tripolar  use.  At this time there were adhesions of the left hydrosalpinx to the left  pelvic sidewall, which are lysed sharply, and the clubbed fimbriated end of  this tube is grasped and ligated using the tripolar.  Progressive bites  along the mesosalpinx are taken down to the cornual area, whereby the tube  is removed and placed in the cul-de-sac.  EndoCatch is placed through the  operative port, 5 mm scope placed.  The specimen is removed through the  operative port without difficulty.  Good hemostasis is noted.  Pictures are  taken.  All trocar sites are removed under direct visualization, CO2  released.  The pursestring suture tied, 4-0 Vicryl placed in the  subcutaneous tissue, Dermabond placed.  The patient tolerates the procedure  well, was awakened and transferred to recovery in good condition after  removal of the Hulka tenaculum per vagina.      Lenoard Aden, M.D.  Electronically Signed     RJT/MEDQ  D:  11/30/2004  T:  11/30/2004  Job:  [id number]

## 2010-07-06 NOTE — Op Note (Signed)
   Chloe [name], Chloe [name]                        ACCOUNT NO.:  [id number]   MEDICAL RECORD NO.:  [id number]                   PATIENT TYPE:  AMB   LOCATION:  SDC                                  [facility]   PHYSICIAN:  Georgina Peer, M.D.              DATE OF BIRTH:  December 21, 1965   DATE OF PROCEDURE:  02/05/2002  DATE OF DISCHARGE:                                 OPERATIVE REPORT   PREOPERATIVE DIAGNOSES:  Menorrhagia and fibroid uterus.   POSTOPERATIVE DIAGNOSES:  Menorrhagia and fibroid uterus.   OPERATION PERFORMED:  Hysteroscopy with resection of posterior endometrial  mass.  D & C.   SURGEON:  Georgina Peer, M.D.   ANESTHESIA:  LMA with paracervical 10 cc Xylocaine 1% block.   COMPLICATIONS:  None.   FLUID LOSS:  None.   BLOOD LOSS:  Less than 20 cc.   FINDINGS:  Posterior wall endometrial thickening, which was noted and  resected.  No other abnormalities found.   INDICATIONS FOR PROCEDURE:  45 year old white female with heavy, crampy  periods.  Endometrial defect was seen on sonohysterogram, and small fibroids  were noted.  The fibroid seemed to be projecting into the uterine cavity.  The patient has come for resection.   DESCRIPTION OF PROCEDURE:  The patient was given an LMA anesthetic, prepped  and draped in a normal sterile fashion.  A paracervical block was  administered.  Posterior wall thickening and ridging was noted, and this was  resected with the resectoscopic wire.  Both tubal ostia were seen.  There  were no anterior defects.  The cavity was sounded to 9 cm posteriorly.  A  sharp curettage was then performed and sent as a separate specimen. The  patient tolerated the procedure well and was sent to the recovery room in  stable condition.  Sponge, needle, and instrument counts were correct.  The  patient received Toradol postoperatively.                                               Georgina Peer, M.D.    JPN/MEDQ  D:  02/05/2002  T:   02/06/2002  Job:  [id number]

## 2010-11-16 LAB — CBC
HCT: 37
MCHC: 34.3
Platelets: 318
RBC: 4.78
WBC: 17 — ABNORMAL HIGH

## 2010-11-16 LAB — HCG, SERUM, QUALITATIVE: Preg, Serum: NEGATIVE

## 2010-11-20 LAB — RAPID URINE DRUG SCREEN, HOSP PERFORMED
Barbiturates: NOT DETECTED
Benzodiazepines: NOT DETECTED
Cocaine: NOT DETECTED

## 2010-11-20 LAB — URINALYSIS, ROUTINE W REFLEX MICROSCOPIC
Glucose, UA: NEGATIVE
Hgb urine dipstick: NEGATIVE
Ketones, ur: NEGATIVE
Protein, ur: NEGATIVE

## 2010-11-20 LAB — CBC
HCT: 43
Hemoglobin: 14.9
MCHC: 34.7
RDW: 13.2

## 2010-11-20 LAB — DIFFERENTIAL
Basophils Relative: 1
Lymphs Abs: 3.4
Monocytes Relative: 7
Neutro Abs: 6.1
Neutrophils Relative %: 58

## 2010-11-20 LAB — COMPREHENSIVE METABOLIC PANEL
Alkaline Phosphatase: 107
BUN: 7
Calcium: 8.9
Glucose, Bld: 105 — ABNORMAL HIGH
Total Protein: 6.6

## 2010-11-22 LAB — COMPREHENSIVE METABOLIC PANEL
ALT: 21 U/L (ref 0–35)
Alkaline Phosphatase: 114 U/L (ref 39–117)
CO2: 26 mEq/L (ref 19–32)
Calcium: 9.6 mg/dL (ref 8.4–10.5)
Chloride: 101 mEq/L (ref 96–112)
GFR calc non Af Amer: >60 mL/min (ref >60)
Glucose, Bld: 105 mg/dL — ABNORMAL HIGH (ref 70–99)
Sodium: 136 mEq/L (ref 135–145)
Total Bilirubin: 0.6 mg/dL (ref 0.3–1.2)

## 2010-11-22 LAB — TSH: TSH: 1.82 u[IU]/mL (ref 0.350–4.500)

## 2010-11-22 LAB — CBC
Hemoglobin: 15.3 g/dL — ABNORMAL HIGH (ref 12.0–15.0)
MCHC: 34.7 g/dL (ref 30.0–36.0)
RBC: 4.76 MIL/uL (ref 3.87–5.11)
WBC: 14.9 10*3/uL — ABNORMAL HIGH (ref 4.0–10.5)

## 2010-12-03 LAB — POCT PREGNANCY, URINE
Operator id: 239701
Preg Test, Ur: NEGATIVE

## 2011-06-25 ENCOUNTER — Encounter (INDEPENDENT_AMBULATORY_CARE_PROVIDER_SITE_OTHER): Payer: BC Managed Care – PPO | Admitting: General Surgery

## 2011-07-03 ENCOUNTER — Encounter (INDEPENDENT_AMBULATORY_CARE_PROVIDER_SITE_OTHER): Payer: BC Managed Care – PPO | Admitting: General Surgery

## 2011-07-18 ENCOUNTER — Encounter (INDEPENDENT_AMBULATORY_CARE_PROVIDER_SITE_OTHER): Payer: BC Managed Care – PPO | Admitting: General Surgery

## 2012-01-28 ENCOUNTER — Other Ambulatory Visit: Payer: Self-pay | Admitting: Urology

## 2012-01-28 DIAGNOSIS — M549 Dorsalgia, unspecified: Secondary | ICD-10-CM

## 2012-01-31 ENCOUNTER — Ambulatory Visit (HOSPITAL_COMMUNITY)
Admission: RE | Admit: 2012-01-31 | Discharge: 2012-01-31 | Disposition: A | Discharge status: Home / Self Care | Payer: BC Managed Care – PPO | Source: Ambulatory Visit | Attending: Urology | Admitting: Urology

## 2012-01-31 DIAGNOSIS — M545 Low back pain, unspecified: Secondary | ICD-10-CM | POA: Insufficient documentation

## 2012-01-31 DIAGNOSIS — R350 Frequency of micturition: Secondary | ICD-10-CM | POA: Insufficient documentation

## 2012-01-31 DIAGNOSIS — M549 Dorsalgia, unspecified: Secondary | ICD-10-CM

## 2012-01-31 DIAGNOSIS — M5126 Other intervertebral disc displacement, lumbar region: Secondary | ICD-10-CM | POA: Insufficient documentation

## 2013-07-21 ENCOUNTER — Ambulatory Visit (INDEPENDENT_AMBULATORY_CARE_PROVIDER_SITE_OTHER): Payer: BC Managed Care – PPO | Admitting: Physician Assistant

## 2013-07-21 VITALS — BP 124/76 | HR 77 | Temp 97.4°F | Resp 18 | Ht 62.0 in | Wt 171.6 lb

## 2013-07-21 DIAGNOSIS — L282 Other prurigo: Secondary | ICD-10-CM

## 2013-07-21 DIAGNOSIS — T7840XA Allergy, unspecified, initial encounter: Secondary | ICD-10-CM

## 2013-07-21 MED ORDER — HYDROXYZINE HCL 25 MG PO TABS: 25.0000 mg | ORAL_TABLET | Freq: Every evening | ORAL | Status: DC | PRN

## 2013-07-21 MED ORDER — RANITIDINE HCL 150 MG PO TABS: 150.0000 mg | ORAL_TABLET | Freq: Two times a day (BID) | ORAL | Status: DC

## 2013-07-21 NOTE — Progress Notes (Signed)
   Subjective:    Patient ID: Chloe [name], female    DOB: 1965/05/13, 48 y.o.   MRN: [id number]  HPI  48 year old female presents for evaluation of acute onset of a rash and pruritis on bilateral hands. Symptoms started yesterday after washing her hands at the dentists office.  Has noticed that her hands have become erythematous and swollen. Since then she has noticed several other areas of redness on the backs of her arms and around her waist.  Admits it is intensely pruritic and is not improving despite several doses of Benadryl.  She also admits she completed a 2 round course of amoxicillin yesterday. No hx of PCN allergy.    Taking Lamictal and Tegretol x 5 yeas. Follow by Dr. Archer Asa.   No fevers, chills, oral lesions, abdominal pain, headache, or dizziness.     Review of Systems  Constitutional: Negative for fever and chills.  Gastrointestinal: Negative for nausea, vomiting and abdominal pain.  Musculoskeletal: Positive for joint swelling (bilateral hands).  Skin: Positive for rash.  Neurological: Negative for dizziness and headaches.       Objective:   Physical Exam  Constitutional: She is oriented to person, place, and time. She appears well-developed and well-nourished.  HENT:  Head: Normocephalic and atraumatic.  Right Ear: External ear normal.  Left Ear: External ear normal.  Mouth/Throat: Uvula is midline, oropharynx is clear and moist and mucous membranes are normal.  No lesions noted in mouth or oropharynx   Eyes: Conjunctivae are normal.  Neck: Normal range of motion.  Cardiovascular: Normal rate, regular rhythm and normal heart sounds.   Pulmonary/Chest: Effort normal and breath sounds normal.  Neurological: She is alert and oriented to person, place, and time.  Skin:  Bilateral hands are swollen, erythematous, and warm. Capillary refill normal. Sensation intact. No scabbed or crusted areas or purulence.  Elbows have several patches of hives. Slight  dermatographism on back.   Psychiatric: She has a normal mood and affect. Her behavior is normal. Judgment and thought content normal.          Assessment & Plan:  Allergic reaction - Plan: hydrOXYzine (ATARAX/VISTARIL) 25 MG tablet, ranitidine (ZANTAC) 150 MG tablet  Pruritic rash - Plan: hydrOXYzine (ATARAX/VISTARIL) 25 MG tablet, ranitidine (ZANTAC) 150 MG tablet  Will treat as likely PCP allergy with Atarax 25 mg at bedtime, Zantac 150 mg twice daily, and Claritin daily in the morning. Avoid prednisone if possible.  RTC precautions discussed - less likely due to lamictal due to length of tx with this medication. F/u here or in ED if acutely worsening.

## 2014-10-11 ENCOUNTER — Other Ambulatory Visit (HOSPITAL_COMMUNITY): Payer: Self-pay | Admitting: Psychiatry

## 2015-10-25 ENCOUNTER — Other Ambulatory Visit: Payer: Self-pay | Admitting: Internal Medicine

## 2016-06-12 ENCOUNTER — Ambulatory Visit: Payer: Self-pay | Admitting: Podiatry

## 2016-06-13 ENCOUNTER — Encounter: Payer: Self-pay | Admitting: Podiatry

## 2016-06-13 ENCOUNTER — Ambulatory Visit (INDEPENDENT_AMBULATORY_CARE_PROVIDER_SITE_OTHER): Payer: BLUE CROSS/BLUE SHIELD | Admitting: Podiatry

## 2016-06-13 VITALS — Resp 16 | Ht 60.0 in | Wt 175.0 lb

## 2016-06-13 DIAGNOSIS — M21619 Bunion of unspecified foot: Secondary | ICD-10-CM | POA: Diagnosis not present

## 2016-06-13 DIAGNOSIS — L600 Ingrowing nail: Secondary | ICD-10-CM

## 2016-06-13 MED ORDER — HYDROCODONE-ACETAMINOPHEN 10-325 MG PO TABS: 1.0000 | ORAL_TABLET | Freq: Three times a day (TID) | ORAL | 0 refills | Status: DC | PRN

## 2016-06-13 NOTE — Patient Instructions (Signed)

## 2016-06-13 NOTE — Progress Notes (Signed)
   Subjective:    Patient ID: Chloe [name], female    DOB: 1966-02-08, 51 y.o.   MRN: [id number]  HPI Chief Complaint  Patient presents with  . Nail Problem    Bilateral; great toes & 2nd toes; pt stated, "Wants to have nails removed; would like all nails removed and wants to discuss"      Review of Systems  All other systems reviewed and are negative.      Objective:   Physical Exam        Assessment & Plan:

## 2016-06-15 NOTE — Progress Notes (Signed)
Subjective:    Patient ID: Chloe [name], female   DOB: 51 y.o.   MRN: [id number]   HPI patient presents stating she's having a lot of pain with her nails and she needs to have the tissue on the left removed 3 on the right and states that it's been going on for a long time and she's tried to trim them and had previous removal but they regrow and giving her trouble again    Review of Systems  All other systems reviewed and are negative.       Objective:  Physical Exam  Cardiovascular: Intact distal pulses.   Musculoskeletal: Normal range of motion.  Neurological: She is alert.  Skin: Skin is warm and dry.  Nursing note and vitals reviewed.  neurovascular status found to be intact muscle strength adequate range of motion within normal limits with patient found to have very thick and incurvated hallux and second nails bilateral and third nail right with dystrophic changes and pain with palpation     Assessment:    Chronic nail disease with exquisite discomfort and cannot take care of these herself due to the pain     Plan:     H&P conditions reviewed and recommended nail removal. Patient wants surgery and I explained procedure to patient and risk and patient wants to have this done. Patient can't all risk as outlined and we will do the left foot first and I infiltrated 60 g Xylocaine Marcaine mixture left hallux 60 Milligan segment mixture left second toe did sterile prep of the areas and then under tourniquet I removed the hallux second nails exposed matrix and applied phenol 5 applications 30 seconds followed by alcohol lavaged sterile dressing. Gave instructions on soaks and reappoint for removal of right nails

## 2016-06-18 ENCOUNTER — Telehealth: Payer: Self-pay | Admitting: *Deleted

## 2016-06-18 NOTE — Telephone Encounter (Signed)
Pt states she had a toenail procedure and she would like to know how long she needed to soak and if she could swim at the beach. I told pt to continue the soaks for 4-6 weeks until the area got a dry hard scab, and not to swim until the area was without broken skin. Pt states understanding.

## 2016-06-26 ENCOUNTER — Ambulatory Visit: Payer: Self-pay | Admitting: Podiatry

## 2016-07-03 ENCOUNTER — Encounter: Payer: Self-pay | Admitting: Podiatry

## 2016-07-03 ENCOUNTER — Ambulatory Visit (INDEPENDENT_AMBULATORY_CARE_PROVIDER_SITE_OTHER): Payer: BLUE CROSS/BLUE SHIELD | Admitting: Podiatry

## 2016-07-03 DIAGNOSIS — L600 Ingrowing nail: Secondary | ICD-10-CM

## 2016-07-03 NOTE — Patient Instructions (Signed)

## 2016-07-08 NOTE — Progress Notes (Signed)
Subjective:    Patient ID: Chloe [name], female   DOB: 51 y.o.   MRN: [id number]   HPI patient presents for removal of the first second and third nails right second and area 2 thick brittle painful nailbeds and states the left ones are healing well    ROS      Objective:  Physical Exam Neurovascular status intact with well-healed nail sites left first and second nails with wound edges well coapted and minimal drainage and severe thickness incurvation and pain of the hallux second and third nails right    Assessment:   Chronic damage to the hallux second and third nails right with the hallux and second nail left healed well      Plan:    H&P conditions reviewed and recommended removal of the nails on the right foot. Patient wants surgery and today I infiltrated with 180 mg Xylocaine Marcaine mixture I then removed the hallux second and third nails and exposed the base and applied phenol 5 applications 30 seconds followed by alcohol lavaged sterile dressing. Gave instructions on soaks and reappoint

## 2016-09-25 ENCOUNTER — Other Ambulatory Visit: Payer: Self-pay | Admitting: Radiology

## 2016-10-15 ENCOUNTER — Ambulatory Visit: Payer: Self-pay | Admitting: Surgery

## 2016-10-15 DIAGNOSIS — N6310 Unspecified lump in the right breast, unspecified quadrant: Secondary | ICD-10-CM

## 2016-10-15 NOTE — H&P (Signed)
Chloe [name] 10/15/2016 4:29 PM [facility] Patient #: [id number] DOB: 04/19/1965 Married / Language: [name] / Race: White Female  History of Present Illness Chloe Fus A. Zaryia Markel MD; 10/15/2016 4:44 PM) Patient words: Patient sent at the request of Dr. Isabell Jarvis for a right breast mass. She found a lump in her right axilla toward the tail of the right breast back in January 2018. She underwent an option which were 1 cm benign-appearing mass consistent with fibroadenoma. She returned for a 6 month follow-up and this showed a modest increase to 3 mm of growth of this mass. Core biopsy was done which shows a biphasic stromal lesion. Excision was recommended. She has no other complaints today.                    Diagnosis Lymph node, needle/core biopsy, right axilla - BIPHASIC (EPITHELIAL/STROMAL) LESION. - SEE COMMENT. Microscopic Comment The biopsy fragments reveal nests of relatively banal-appearing epithelioid cells in a background of myxoid hypocellular stroma. The tumor cells are positive for E-Cadherin. There is focal staining for p63, cytokeratin 7, calponin, smooth muscle myosin, and GATA-3. Estrogen receptor is essentially negative. The differential diagnosis is broad, but includes a myoepithelial/myofibroblastic tumor as well as a fibroadenoma. The results were called to Chloe [name] on 09/26/2016. Chloe Leisure MD Pathologist, Electronic Signature (Case signed 09/26/2016) Specimen Gross and Clinical Information Specimen Comment In formalin 1:41 pm; small axillary mass Specimen(s) Obtained: Lymph node, needle/core biopsy, right axilla Specimen Clinical Information ? Lymph node Gross Received labeled "Chloe [name]" and "R axilla" (TIF 1341 CIT ?) are 4 cores of yellow to gray white soft tissue, ranging from 0.3 x 0.1 x 0.1 cm to 0.8 x 0.1 x 0.1 cm. One block submitted. (SSW 8/8) 1 of.  The patient is a 51 year old  female.   Past Surgical History (Chloe [name], RMA; 10/15/2016 4:29 PM) Colon Polyp Removal - Colonoscopy Foot Surgery Left. Gallbladder Surgery - Laparoscopic Hysterectomy (not due to cancer) - Complete  Diagnostic Studies History (Chloe [name], RMA; 10/15/2016 4:29 PM) Colonoscopy 5-10 years ago Mammogram within last year Pap Smear >5 years ago  Allergies (Chloe [name], RMA; 10/15/2016 4:30 PM) No Known Drug Allergies 10/15/2016 Allergies Reconciled  Medication History (Chloe [name], RMA; 10/15/2016 4:31 PM) Vitamin D (Ergocalciferol) (50000UNIT Capsule, Oral) Active. LORazepam (0.5MG  Tablet, Oral) Active. BuPROPion HCl ER (XL) (150MG  Tablet ER 24HR, Oral) Active. Pravastatin Sodium (20MG  Tablet, Oral) Active. ProAir HFA (108 (90 Base)MCG/ACT Aerosol Soln, Inhalation) Active. Medications Reconciled  Social History (Chloe [name], RMA; 10/15/2016 4:29 PM) Alcohol use Occasional alcohol use. Caffeine use Carbonated beverages, Coffee, Tea. Illicit drug use Remotely quit drug use. Tobacco use Current every day smoker.  Family History (Chloe [name], RMA; 10/15/2016 4:29 PM) Alcohol Abuse Father. Arthritis Father. Cancer Father, Mother, Sister. Colon Polyps Father. Diabetes Mellitus Brother, Family Members In General. Heart Disease Father, Mother. Heart disease in female family member before age 92 Heart disease in female family member before age 45 Hypertension Brother. Respiratory Condition Family Members In General, Father, Mother.  Pregnancy / Birth History (Chloe [name], RMA; 10/15/2016 4:29 PM) Age at menarche 13 years. Contraceptive History Oral contraceptives. Gravida 1 Irregular periods Maternal age <15 Para 0  Other Problems (Chloe [name], RMA; 10/15/2016 4:29 PM) Anxiety Disorder Back Pain Cholelithiasis Depression Hypercholesterolemia Oophorectomy     Review of Systems (Chloe A.  [name] RMA; 10/15/2016 4:29 PM) General Not Present- Appetite Loss, Chills, Fatigue,  Fever, Night Sweats, Weight Gain and Weight Loss. Skin Not Present- Change in Wart/Mole, Dryness, Hives, Jaundice, New Lesions, Non-Healing Wounds, Rash and Ulcer. HEENT Present- Seasonal Allergies and Wears glasses/contact lenses. Not Present- Earache, Hearing Loss, Hoarseness, Nose Bleed, Oral Ulcers, Ringing in the Ears, Sinus Pain, Sore Throat, Visual Disturbances and Yellow Eyes. Respiratory Present- Snoring. Not Present- Bloody sputum, Chronic Cough, Difficulty Breathing and Wheezing. Breast Not Present- Breast Mass, Breast Pain, Nipple Discharge and Skin Changes. Cardiovascular Not Present- Chest Pain, Difficulty Breathing Lying Down, Leg Cramps, Palpitations, Rapid Heart Rate, Shortness of Breath and Swelling of Extremities. Gastrointestinal Present- Bloating and Indigestion. Not Present- Abdominal Pain, Bloody Stool, Change in Bowel Habits, Chronic diarrhea, Constipation, Difficulty Swallowing, Excessive gas, Gets full quickly at meals, Hemorrhoids, Nausea, Rectal Pain and Vomiting. Female Genitourinary Present- Urgency. Not Present- Frequency, Nocturia, Painful Urination and Pelvic Pain. Musculoskeletal Present- Back Pain, Joint Pain, Joint Stiffness, Muscle Pain, Muscle Weakness and Swelling of Extremities. Psychiatric Present- Anxiety, Bipolar and Depression. Not Present- Change in Sleep Pattern, Fearful and Frequent crying. Endocrine Present- Hot flashes. Not Present- Cold Intolerance, Excessive Hunger, Hair Changes, Heat Intolerance and New Diabetes.  Vitals (Chloe [name] RMA; 10/15/2016 4:30 PM) 10/15/2016 4:30 PM Weight: 169.6 lb Height: 61in Body Surface Area: 1.76 m Body Mass Index: 32.05 kg/m  Temp.: 97.6F  Pulse: 86 (Regular)  P.OX: 98% (Room air) BP: 128/74 (Sitting, Left Arm, Standard)      Physical Exam (Chloe Kulkarni A. Chloe Leeson MD; 10/15/2016 4:45 PM)  General Mental  Status-Alert. General Appearance-Consistent with stated age. Hydration-Well hydrated. Voice-Normal.  Head and Neck Head-normocephalic, atraumatic with no lesions or palpable masses. Trachea-midline. Thyroid Gland Characteristics - normal size and consistency.  Chest and Lung Exam Chest and lung exam reveals -quiet, even and easy respiratory effort with no use of accessory muscles and on auscultation, normal breath sounds, no adventitious sounds and normal vocal resonance. Inspection Chest Wall - Normal. Back - normal.  Breast Note: Right breast in the tail of the right breast is 1 cm mobile mass. There is no other significant right axillary adenopathy. The remainder the right breast is normal. Left breast is normal.  Cardiovascular Cardiovascular examination reveals -normal heart sounds, regular rate and rhythm with no murmurs and normal pedal pulses bilaterally.  Lymphatic Head & Neck  General Head & Neck Lymphatics: Bilateral - Description - Normal. Axillary  General Axillary Region: Bilateral - Description - Normal. Tenderness - Non Tender.    Assessment & Plan (Vermell Madrid A. Raileigh Sabater MD; 10/15/2016 4:46 PM)  BREAST MASS, RIGHT (N63.10) Impression: Recommend excision of right breast mass. Risk of lumpectomy include bleeding, infection, seroma, more surgery, use of seed/wire, wound care, cosmetic deformity and the need for other treatments, death , blood clots, death. Pt agrees to proceed.  Current Plans You are being scheduled for surgery- Our schedulers will call you.  You should hear from our office's scheduling department within 5 working days about the location, date, and time of surgery. We try to make accommodations for patient's preferences in scheduling surgery, but sometimes the OR schedule or the surgeon's schedule prevents us from making those accommodations.  If you have not heard from our office ([phone]) in 5 working days, call the office  and ask for your surgeon's nurse.  If you have other questions about your diagnosis, plan, or surgery, call the office and ask for your surgeon's nurse.  Pt Education - [facility] Breast Biopsy HCI: discussed with patient and provided information. 

## 2016-11-06 ENCOUNTER — Encounter (HOSPITAL_BASED_OUTPATIENT_CLINIC_OR_DEPARTMENT_OTHER): Payer: Self-pay | Admitting: *Deleted

## 2016-11-11 NOTE — Progress Notes (Signed)
Ensure pre surgery drink given with instructions, pt verbalized understanding. 

## 2016-11-12 ENCOUNTER — Ambulatory Visit (HOSPITAL_BASED_OUTPATIENT_CLINIC_OR_DEPARTMENT_OTHER): Payer: BLUE CROSS/BLUE SHIELD | Admitting: Anesthesiology

## 2016-11-12 ENCOUNTER — Encounter (HOSPITAL_BASED_OUTPATIENT_CLINIC_OR_DEPARTMENT_OTHER): Payer: Self-pay | Admitting: Anesthesiology

## 2016-11-12 ENCOUNTER — Encounter (HOSPITAL_BASED_OUTPATIENT_CLINIC_OR_DEPARTMENT_OTHER)
Admission: RE | Disposition: A | Discharge status: Home / Self Care | Payer: Self-pay | Source: Ambulatory Visit | Attending: Surgery

## 2016-11-12 ENCOUNTER — Ambulatory Visit (HOSPITAL_BASED_OUTPATIENT_CLINIC_OR_DEPARTMENT_OTHER)
Admission: RE | Admit: 2016-11-12 | Discharge: 2016-11-12 | Disposition: A | Discharge status: Home / Self Care | Payer: BLUE CROSS/BLUE SHIELD | Source: Ambulatory Visit | Attending: Surgery | Admitting: Surgery

## 2016-11-12 DIAGNOSIS — F329 Major depressive disorder, single episode, unspecified: Secondary | ICD-10-CM | POA: Diagnosis not present

## 2016-11-12 DIAGNOSIS — F17200 Nicotine dependence, unspecified, uncomplicated: Secondary | ICD-10-CM | POA: Insufficient documentation

## 2016-11-12 DIAGNOSIS — Z809 Family history of malignant neoplasm, unspecified: Secondary | ICD-10-CM | POA: Insufficient documentation

## 2016-11-12 DIAGNOSIS — E785 Hyperlipidemia, unspecified: Secondary | ICD-10-CM | POA: Diagnosis not present

## 2016-11-12 DIAGNOSIS — N6310 Unspecified lump in the right breast, unspecified quadrant: Secondary | ICD-10-CM

## 2016-11-12 DIAGNOSIS — Z8261 Family history of arthritis: Secondary | ICD-10-CM | POA: Insufficient documentation

## 2016-11-12 DIAGNOSIS — Z6833 Body mass index (BMI) 33.0-33.9, adult: Secondary | ICD-10-CM | POA: Diagnosis not present

## 2016-11-12 DIAGNOSIS — E669 Obesity, unspecified: Secondary | ICD-10-CM | POA: Insufficient documentation

## 2016-11-12 DIAGNOSIS — Z833 Family history of diabetes mellitus: Secondary | ICD-10-CM | POA: Diagnosis not present

## 2016-11-12 DIAGNOSIS — E7800 Pure hypercholesterolemia, unspecified: Secondary | ICD-10-CM | POA: Insufficient documentation

## 2016-11-12 DIAGNOSIS — Z8249 Family history of ischemic heart disease and other diseases of the circulatory system: Secondary | ICD-10-CM | POA: Diagnosis not present

## 2016-11-12 DIAGNOSIS — K219 Gastro-esophageal reflux disease without esophagitis: Secondary | ICD-10-CM | POA: Insufficient documentation

## 2016-11-12 DIAGNOSIS — Z90710 Acquired absence of both cervix and uterus: Secondary | ICD-10-CM | POA: Diagnosis not present

## 2016-11-12 DIAGNOSIS — F419 Anxiety disorder, unspecified: Secondary | ICD-10-CM | POA: Diagnosis not present

## 2016-11-12 DIAGNOSIS — Z811 Family history of alcohol abuse and dependence: Secondary | ICD-10-CM | POA: Insufficient documentation

## 2016-11-12 HISTORY — DX: Bipolar disorder, unspecified: F31.9

## 2016-11-12 HISTORY — DX: Hyperlipidemia, unspecified: E78.5

## 2016-11-12 HISTORY — DX: Gastro-esophageal reflux disease without esophagitis: K21.9

## 2016-11-12 HISTORY — DX: Unspecified lump in the right breast, unspecified quadrant: N63.10

## 2016-11-12 HISTORY — PX: BREAST LUMPECTOMY WITH RADIOACTIVE SEED LOCALIZATION: SHX6424

## 2016-11-12 SURGERY — BREAST LUMPECTOMY WITH RADIOACTIVE SEED LOCALIZATION
Anesthesia: General | Site: Breast | Laterality: Right

## 2016-11-12 MED ORDER — HYDROCODONE-ACETAMINOPHEN 5-325 MG PO TABS: 1.0000 | ORAL_TABLET | Freq: Four times a day (QID) | ORAL | 0 refills | Status: DC | PRN

## 2016-11-12 MED ORDER — FENTANYL CITRATE (PF) 100 MCG/2ML IJ SOLN
25.0000 ug | INTRAMUSCULAR | Status: DC | PRN
  Administered 2016-11-12 (×2): 25 ug via INTRAVENOUS

## 2016-11-12 MED ORDER — MIDAZOLAM HCL 2 MG/2ML IJ SOLN
1.0000 mg | INTRAMUSCULAR | Status: DC | PRN
  Administered 2016-11-12: 2 mg via INTRAVENOUS

## 2016-11-12 MED ORDER — DEXAMETHASONE SODIUM PHOSPHATE 4 MG/ML IJ SOLN
INTRAMUSCULAR | Status: DC | PRN
  Administered 2016-11-12: 10 mg via INTRAVENOUS

## 2016-11-12 MED ORDER — LIDOCAINE 2% (20 MG/ML) 5 ML SYRINGE
INTRAMUSCULAR | Status: DC | PRN
  Administered 2016-11-12: 80 mg via INTRAVENOUS

## 2016-11-12 MED ORDER — MEPERIDINE HCL 25 MG/ML IJ SOLN: 6.2500 mg | INTRAMUSCULAR | Status: DC | PRN

## 2016-11-12 MED ORDER — METOCLOPRAMIDE HCL 5 MG/ML IJ SOLN
10.0000 mg | Freq: Once | INTRAMUSCULAR | Status: DC | PRN
Start: 2016-11-12 — End: 2016-11-12

## 2016-11-12 MED ORDER — ONDANSETRON HCL 4 MG/2ML IJ SOLN
INTRAMUSCULAR | Status: DC | PRN
  Administered 2016-11-12: 4 mg via INTRAVENOUS

## 2016-11-12 MED ORDER — CIPROFLOXACIN IN D5W 400 MG/200ML IV SOLN
400.0000 mg | INTRAVENOUS | Status: AC
  Administered 2016-11-12: 400 mg via INTRAVENOUS

## 2016-11-12 MED ORDER — DEXAMETHASONE SODIUM PHOSPHATE 10 MG/ML IJ SOLN
INTRAMUSCULAR | Status: AC
  Filled 2016-11-12: qty 1

## 2016-11-12 MED ORDER — OXYCODONE HCL 5 MG/5ML PO SOLN: 5.0000 mg | Freq: Once | ORAL | Status: DC | PRN

## 2016-11-12 MED ORDER — PROPOFOL 10 MG/ML IV BOLUS
INTRAVENOUS | Status: AC
  Filled 2016-11-12: qty 20

## 2016-11-12 MED ORDER — EPHEDRINE SULFATE-NACL 50-0.9 MG/10ML-% IV SOSY
PREFILLED_SYRINGE | INTRAVENOUS | Status: DC | PRN
  Administered 2016-11-12: 10 mg via INTRAVENOUS

## 2016-11-12 MED ORDER — IBUPROFEN 800 MG PO TABS: 800.0000 mg | ORAL_TABLET | Freq: Three times a day (TID) | ORAL | 0 refills | Status: DC | PRN

## 2016-11-12 MED ORDER — GABAPENTIN 300 MG PO CAPS
ORAL_CAPSULE | ORAL | Status: AC
  Filled 2016-11-12: qty 1

## 2016-11-12 MED ORDER — PROPOFOL 10 MG/ML IV BOLUS
INTRAVENOUS | Status: DC | PRN
  Administered 2016-11-12: 150 mg via INTRAVENOUS

## 2016-11-12 MED ORDER — CELECOXIB 200 MG PO CAPS
ORAL_CAPSULE | ORAL | Status: AC
  Filled 2016-11-12: qty 2

## 2016-11-12 MED ORDER — CIPROFLOXACIN IN D5W 400 MG/200ML IV SOLN
INTRAVENOUS | Status: AC
  Filled 2016-11-12: qty 200

## 2016-11-12 MED ORDER — OXYCODONE HCL 5 MG PO TABS: 5.0000 mg | ORAL_TABLET | Freq: Once | ORAL | Status: DC | PRN

## 2016-11-12 MED ORDER — ACETAMINOPHEN 500 MG PO TABS
1000.0000 mg | ORAL_TABLET | ORAL | Status: AC
  Administered 2016-11-12: 1000 mg via ORAL

## 2016-11-12 MED ORDER — ONDANSETRON HCL 4 MG/2ML IJ SOLN
INTRAMUSCULAR | Status: AC
  Filled 2016-11-12: qty 2

## 2016-11-12 MED ORDER — FENTANYL CITRATE (PF) 100 MCG/2ML IJ SOLN
INTRAMUSCULAR | Status: AC
  Filled 2016-11-12: qty 2

## 2016-11-12 MED ORDER — ACETAMINOPHEN 500 MG PO TABS
ORAL_TABLET | ORAL | Status: AC
  Filled 2016-11-12: qty 2

## 2016-11-12 MED ORDER — MIDAZOLAM HCL 2 MG/2ML IJ SOLN
INTRAMUSCULAR | Status: AC
  Filled 2016-11-12: qty 2

## 2016-11-12 MED ORDER — LIDOCAINE 2% (20 MG/ML) 5 ML SYRINGE
INTRAMUSCULAR | Status: AC
  Filled 2016-11-12: qty 5

## 2016-11-12 MED ORDER — LACTATED RINGERS IV SOLN
INTRAVENOUS | Status: DC
  Administered 2016-11-12: 09:00:00 via INTRAVENOUS

## 2016-11-12 MED ORDER — BUPIVACAINE-EPINEPHRINE (PF) 0.25% -1:200000 IJ SOLN
INTRAMUSCULAR | Status: DC | PRN
  Administered 2016-11-12: 18 mL

## 2016-11-12 MED ORDER — SCOPOLAMINE 1 MG/3DAYS TD PT72: 1.0000 | MEDICATED_PATCH | Freq: Once | TRANSDERMAL | Status: DC | PRN

## 2016-11-12 MED ORDER — CHLORHEXIDINE GLUCONATE CLOTH 2 % EX PADS: 6.0000 | MEDICATED_PAD | Freq: Once | CUTANEOUS | Status: DC

## 2016-11-12 MED ORDER — FENTANYL CITRATE (PF) 100 MCG/2ML IJ SOLN
50.0000 ug | INTRAMUSCULAR | Status: DC | PRN
  Administered 2016-11-12: 100 ug via INTRAVENOUS

## 2016-11-12 MED ORDER — CELECOXIB 400 MG PO CAPS
400.0000 mg | ORAL_CAPSULE | ORAL | Status: AC
  Administered 2016-11-12: 400 mg via ORAL

## 2016-11-12 MED ORDER — GABAPENTIN 300 MG PO CAPS
300.0000 mg | ORAL_CAPSULE | ORAL | Status: AC
  Administered 2016-11-12: 300 mg via ORAL

## 2016-11-12 SURGICAL SUPPLY — 48 items
APPLIER CLIP 9.375 MED OPEN (MISCELLANEOUS)
BINDER BREAST LRG (GAUZE/BANDAGES/DRESSINGS) IMPLANT
BINDER BREAST MEDIUM (GAUZE/BANDAGES/DRESSINGS) IMPLANT
BINDER BREAST XLRG (GAUZE/BANDAGES/DRESSINGS) ×2 IMPLANT
BINDER BREAST XXLRG (GAUZE/BANDAGES/DRESSINGS) IMPLANT
BLADE SURG 15 STRL LF DISP TIS (BLADE) ×1 IMPLANT
BLADE SURG 15 STRL SS (BLADE) ×1
CANISTER SUC SOCK COL 7IN (MISCELLANEOUS) IMPLANT
CANISTER SUCT 1200ML W/VALVE (MISCELLANEOUS) IMPLANT
CHLORAPREP W/TINT 26ML (MISCELLANEOUS) ×2 IMPLANT
CLIP APPLIE 9.375 MED OPEN (MISCELLANEOUS) IMPLANT
COVER BACK TABLE 60X90IN (DRAPES) ×2 IMPLANT
COVER MAYO STAND STRL (DRAPES) ×2 IMPLANT
COVER PROBE W GEL 5X96 (DRAPES) ×2 IMPLANT
DECANTER SPIKE VIAL GLASS SM (MISCELLANEOUS) IMPLANT
DERMABOND ADVANCED (GAUZE/BANDAGES/DRESSINGS) ×1
DERMABOND ADVANCED .7 DNX12 (GAUZE/BANDAGES/DRESSINGS) ×1 IMPLANT
DEVICE DUBIN W/COMP PLATE 8390 (MISCELLANEOUS) ×2 IMPLANT
DRAPE LAPAROTOMY 100X72 PEDS (DRAPES) ×2 IMPLANT
DRAPE UTILITY XL STRL (DRAPES) ×2 IMPLANT
ELECT COATED BLADE 2.86 ST (ELECTRODE) ×2 IMPLANT
ELECT REM PT RETURN 9FT ADLT (ELECTROSURGICAL) ×2
ELECTRODE REM PT RTRN 9FT ADLT (ELECTROSURGICAL) ×1 IMPLANT
GLOVE BIO SURGEON STRL SZ7 (GLOVE) ×2 IMPLANT
GLOVE BIOGEL PI IND STRL 7.0 (GLOVE) ×1 IMPLANT
GLOVE BIOGEL PI IND STRL 8 (GLOVE) ×1 IMPLANT
GLOVE BIOGEL PI INDICATOR 7.0 (GLOVE) ×1
GLOVE BIOGEL PI INDICATOR 8 (GLOVE) ×1
GLOVE ECLIPSE 8.0 STRL XLNG CF (GLOVE) ×2 IMPLANT
GOWN STRL REUS W/ TWL LRG LVL3 (GOWN DISPOSABLE) ×2 IMPLANT
GOWN STRL REUS W/TWL LRG LVL3 (GOWN DISPOSABLE) ×2
HEMOSTAT ARISTA ABSORB 3G PWDR (MISCELLANEOUS) IMPLANT
HEMOSTAT SNOW SURGICEL 2X4 (HEMOSTASIS) IMPLANT
KIT MARKER MARGIN INK (KITS) ×2 IMPLANT
NEEDLE HYPO 25X1 1.5 SAFETY (NEEDLE) ×2 IMPLANT
NS IRRIG 1000ML POUR BTL (IV SOLUTION) ×2 IMPLANT
PACK BASIN DAY SURGERY FS (CUSTOM PROCEDURE TRAY) ×2 IMPLANT
PENCIL BUTTON HOLSTER BLD 10FT (ELECTRODE) ×2 IMPLANT
SLEEVE SCD COMPRESS KNEE MED (MISCELLANEOUS) ×2 IMPLANT
SPONGE LAP 4X18 X RAY DECT (DISPOSABLE) ×2 IMPLANT
SUT MNCRL AB 4-0 PS2 18 (SUTURE) ×2 IMPLANT
SUT SILK 2 0 SH (SUTURE) IMPLANT
SUT VICRYL 3-0 CR8 SH (SUTURE) ×2 IMPLANT
SYR CONTROL 10ML LL (SYRINGE) ×2 IMPLANT
TOWEL OR 17X24 6PK STRL BLUE (TOWEL DISPOSABLE) ×2 IMPLANT
TOWEL OR NON WOVEN STRL DISP B (DISPOSABLE) ×2 IMPLANT
TUBE CONNECTING 20X1/4 (TUBING) IMPLANT
YANKAUER SUCT BULB TIP NO VENT (SUCTIONS) IMPLANT

## 2016-11-12 NOTE — Op Note (Signed)
Preoperative diagnosis: Right breast mass biphasic lesion  Postoperative diagnosis: Same  Procedure: Right breast seed localized lumpectomy  Surgeon: Harriette Bouillon M.D.  Anesthesia: LMA with local  EBL: Minimal  Specimen: Right breast mass with seed and additional superficial and inferior margins  Drains: None  Indications for procedure: The patient presents for right breast lumpectomy. She had or mass in her right breast in the tail of the right breast has been followed for 6 months. Core biopsy showed a biphasic lesion and there is increasing growth. The patient opted for excision.The procedure has been discussed with the patient. Alternatives to surgery have been discussed with the patient.  Risks of surgery include bleeding,  Infection,  Seroma formation, death,  and the need for further surgery.   The patient understands and wishes to proceed.  Description of procedure: The patient was seen in the holding area. The patient underwent right breast localization as an outpatient. The films were sent with the patient and reviewed. Of note there is no clip placement radiologist only a seed in the mass lesion. Questions are answered. She was taken back to the operating room and placed upon the OR table. Induction of LMA anesthesia, the right breast prepped and draped in a sterile fashion. Timeout was done. Local anesthetic was infiltrated in the right breast in the upper outer quadrant. Neoprobe was used to identify the seed. A 4 cm incision was made in the right axilla. The lesion was quite superficial to the lesion with the overlying skin. The neoprobe was used to verify presence of the seed. Intraoperative radiograph was taken which showed the mass seen in the specimen. I took an additional inferior margin as well. This was sent separately. The films were reviewed by the radiologist in a patient was placed at the location the seed and this was sent to pathology. Hemostasis achieved. Wound closed  with 3-0 Vicryl for Monocryl. Dermabond applied. All final counts found to be correct. Patient was awoke extubated taken recovery in satisfactory condition.

## 2016-11-12 NOTE — Anesthesia Procedure Notes (Signed)
Procedure Name: LMA Insertion Date/Time: 11/12/2016 9:40 AM Performed by: Gar Gibbon Pre-anesthesia Checklist: Patient identified, Emergency Drugs available, Suction available and Patient being monitored Patient Re-evaluated:Patient Re-evaluated prior to induction Oxygen Delivery Method: Circle system utilized Preoxygenation: Pre-oxygenation with 100% oxygen Induction Type: IV induction Ventilation: Mask ventilation without difficulty LMA: LMA inserted LMA Size: 3.0 Number of attempts: 1 Airway Equipment and Method: Bite block Placement Confirmation: positive ETCO2 Tube secured with: Tape Dental Injury: Teeth and Oropharynx as per pre-operative assessment

## 2016-11-12 NOTE — Anesthesia Postprocedure Evaluation (Signed)
Anesthesia Post Note  Patient: Chloe [name]  Procedure(s) Performed: Procedure(s) (LRB): RIGHT BREAST LUMPECTOMY WITH RADIOACTIVE SEED LOCALIZATION (Right)     Patient location during evaluation: PACU Anesthesia Type: General Level of consciousness: awake and alert and oriented Pain management: pain level controlled Vital Signs Assessment: post-procedure vital signs reviewed and stable Respiratory status: spontaneous breathing, nonlabored ventilation and respiratory function stable Cardiovascular status: blood pressure returned to baseline and stable Postop Assessment: no apparent nausea or vomiting Anesthetic complications: no    Last Vitals:  Vitals:   11/12/16 1045 11/12/16 1100  BP: (!) 151/89 (!) 150/92  Pulse: 73 73  Resp: 11 12  Temp:    SpO2: 99% 96%    Last Pain:  Vitals:   11/12/16 1100  TempSrc:   PainSc: 4                  Abril Cappiello A.

## 2016-11-12 NOTE — Discharge Instructions (Signed)
[facility] °Office Phone Number [phone] ° °BREAST BIOPSY/ PARTIAL MASTECTOMY: POST OP INSTRUCTIONS ° °Always review your discharge instruction sheet given to you by the facility where your surgery was performed. ° °IF YOU HAVE DISABILITY OR FAMILY LEAVE FORMS, YOU MUST BRING THEM TO THE OFFICE FOR PROCESSING.  DO NOT GIVE THEM TO YOUR DOCTOR. ° °1. A prescription for pain medication may be given to you upon discharge.  Take your pain medication as prescribed, if needed.  If narcotic pain medicine is not needed, then you may take acetaminophen (Tylenol) or ibuprofen (Advil) as needed. °2. Take your usually prescribed medications unless otherwise directed °3. If you need a refill on your pain medication, please contact your pharmacy.  They will contact our office to request authorization.  Prescriptions will not be filled after 5pm or on week-ends. °4. You should eat very light the first 24 hours after surgery, such as soup, crackers, pudding, etc.  Resume your normal diet the day after surgery. °5. Most patients will experience some swelling and bruising in the breast.  Ice packs and a good support bra will help.  Swelling and bruising can take several days to resolve.  °6. It is common to experience some constipation if taking pain medication after surgery.  Increasing fluid intake and taking a stool softener will usually help or prevent this problem from occurring.  A mild laxative (Milk of Magnesia or Miralax) should be taken according to package directions if there are no bowel movements after 48 hours. °7. Unless discharge instructions indicate otherwise, you may remove your bandages 24-48 hours after surgery, and you may shower at that time.  You may have steri-strips (small skin tapes) in place directly over the incision.  These strips should be left on the skin for 7-10 days.  If your surgeon used skin glue on the incision, you may shower in 24 hours.  The glue will flake off over the  next 2-3 weeks.  Any sutures or staples will be removed at the office during your follow-up visit. °8. ACTIVITIES:  You may resume regular daily activities (gradually increasing) beginning the next day.  Wearing a good support bra or sports bra minimizes pain and swelling.  You may have sexual intercourse when it is comfortable. °a. You may drive when you no longer are taking prescription pain medication, you can comfortably wear a seatbelt, and you can safely maneuver your car and apply brakes. °b. RETURN TO WORK:  ______________________________________________________________________________________ °9. You should see your doctor in the office for a follow-up appointment approximately two weeks after your surgery.  Your doctor’s nurse will typically make your follow-up appointment when she calls you with your pathology report.  Expect your pathology report 2-3 business days after your surgery.  You may call to check if you do not hear from us after three days. °10. OTHER INSTRUCTIONS: _______________________________________________________________________________________________ _____________________________________________________________________________________________________________________________________ °_____________________________________________________________________________________________________________________________________ °_____________________________________________________________________________________________________________________________________ ° °WHEN TO CALL YOUR DOCTOR: °1. Fever over 101.0 °2. Nausea and/or vomiting. °3. Extreme swelling or bruising. °4. Continued bleeding from incision. °5. Increased pain, redness, or drainage from the incision. ° °The clinic staff is available to answer your questions during regular business hours.  Please don’t hesitate to call and ask to speak to one of the nurses for clinical concerns.  If you have a medical emergency, go to the nearest  emergency room or call 911.  A surgeon from [facility] is always on call at the hospital. ° °For further questions, please visit [email or web address]  ° ° ° ° °  Post Anesthesia Home Care Instructions ° °Activity: °Get plenty of rest for the remainder of the day. A responsible individual must stay with you for 24 hours following the procedure.  °For the next 24 hours, DO NOT: °-Drive a car °-Operate machinery °-Drink alcoholic beverages °-Take any medication unless instructed by your physician °-Make any legal decisions or sign important papers. ° °Meals: °Start with liquid foods such as gelatin or soup. Progress to regular foods as tolerated. Avoid greasy, spicy, heavy foods. If nausea and/or vomiting occur, drink only clear liquids until the nausea and/or vomiting subsides. Call your physician if vomiting continues. ° °Special Instructions/Symptoms: °Your throat may feel dry or sore from the anesthesia or the breathing tube placed in your throat during surgery. If this causes discomfort, gargle with warm salt water. The discomfort should disappear within 24 hours. ° °If you had a scopolamine patch placed behind your ear for the management of post- operative nausea and/or vomiting: ° °1. The medication in the patch is effective for 72 hours, after which it should be removed.  Wrap patch in a tissue and discard in the trash. Wash hands thoroughly with soap and water. °2. You may remove the patch earlier than 72 hours if you experience unpleasant side effects which may include dry mouth, dizziness or visual disturbances. °3. Avoid touching the patch. Wash your hands with soap and water after contact with the patch. °  ° °

## 2016-11-12 NOTE — Transfer of Care (Signed)
Immediate Anesthesia Transfer of Care Note  Patient: Chloe [name]  Procedure(s) Performed: Procedure(s): RIGHT BREAST LUMPECTOMY WITH RADIOACTIVE SEED LOCALIZATION (Right)  Patient Location: PACU  Anesthesia Type:General  Level of Consciousness: awake, sedated and responds to stimulation  Airway & Oxygen Therapy: Patient Spontanous Breathing and Patient connected to face mask oxygen  Post-op Assessment: Report given to RN and Post -op Vital signs reviewed and stable  Post vital signs: Reviewed and stable  Last Vitals:  Vitals:   11/12/16 0844  BP: (!) 142/65  Pulse: 70  Resp: 20  Temp: 36.8 C  SpO2: 99%    Last Pain:  Vitals:   11/12/16 0844  TempSrc: Oral         Complications: No apparent anesthesia complications

## 2016-11-12 NOTE — H&P (View-Only) (Signed)
Chloe [name] 10/15/2016 4:29 PM [facility] Patient #: [id number] DOB: 04/19/1965 Married / Language: [name] / Race: White Female  History of Present Illness Chloe Fus A. Andru Genter MD; 10/15/2016 4:44 PM) Patient words: Patient sent at the request of Dr. Isabell [name] for a right breast mass. She found a lump in her right axilla toward the tail of the right breast back in January 2018. She underwent an option which were 1 cm benign-appearing mass consistent with fibroadenoma. She returned for a 6 month follow-up and this showed a modest increase to 3 mm of growth of this mass. Core biopsy was done which shows a biphasic stromal lesion. Excision was recommended. She has no other complaints today.                    Diagnosis Lymph node, needle/core biopsy, right axilla - BIPHASIC (EPITHELIAL/STROMAL) LESION. - SEE COMMENT. Microscopic Comment The biopsy fragments reveal nests of relatively banal-appearing epithelioid cells in a background of myxoid hypocellular stroma. The tumor cells are positive for E-Cadherin. There is focal staining for p63, cytokeratin 7, calponin, smooth muscle myosin, and GATA-3. Estrogen receptor is essentially negative. The differential diagnosis is broad, but includes a myoepithelial/myofibroblastic tumor as well as a fibroadenoma. The results were called to [facility] on 09/26/2016. Chloe Leisure MD Pathologist, Electronic Signature (Case signed 09/26/2016) Specimen Gross and Clinical Information Specimen Comment In formalin 1:41 pm; small axillary mass Specimen(s) Obtained: Lymph node, needle/core biopsy, right axilla Specimen Clinical Information ? Lymph node Gross Received labeled "Chloe [name]" and "R axilla" (TIF 1341 CIT ?) are 4 cores of yellow to gray white soft tissue, ranging from 0.3 x 0.1 x 0.1 cm to 0.8 x 0.1 x 0.1 cm. One block submitted. (SSW 8/8) 1 of.  The patient is a 51 year old  female.   Past Surgical History (Chloe [name], RMA; 10/15/2016 4:29 PM) Colon Polyp Removal - Colonoscopy Foot Surgery Left. Gallbladder Surgery - Laparoscopic Hysterectomy (not due to cancer) - Complete  Diagnostic Studies History (Chloe [name], RMA; 10/15/2016 4:29 PM) Colonoscopy 5-10 years ago Mammogram within last year Pap Smear >5 years ago  Allergies (Chloe [name], RMA; 10/15/2016 4:30 PM) No Known Drug Allergies 10/15/2016 Allergies Reconciled  Medication History (Chloe [name], RMA; 10/15/2016 4:31 PM) Vitamin D (Ergocalciferol) (50000UNIT Capsule, Oral) Active. LORazepam (0.5MG  Tablet, Oral) Active. BuPROPion HCl ER (XL) (150MG  Tablet ER 24HR, Oral) Active. Pravastatin Sodium (20MG  Tablet, Oral) Active. ProAir HFA (108 (90 Base)MCG/ACT Aerosol Soln, Inhalation) Active. Medications Reconciled  Social History (Chloe [name], RMA; 10/15/2016 4:29 PM) Alcohol use Occasional alcohol use. Caffeine use Carbonated beverages, Coffee, Tea. Illicit drug use Remotely quit drug use. Tobacco use Current every day smoker.  Family History (Chloe [name], RMA; 10/15/2016 4:29 PM) Alcohol Abuse Father. Arthritis Father. Cancer Father, Mother, Sister. Colon Polyps Father. Diabetes Mellitus Brother, Family Members In General. Heart Disease Father, Mother. Heart disease in female family member before age 92 Heart disease in female family member before age 45 Hypertension Brother. Respiratory Condition Family Members In General, Father, Mother.  Pregnancy / Birth History (Chloe [name], RMA; 10/15/2016 4:29 PM) Age at menarche 13 years. Contraceptive History Oral contraceptives. Gravida 1 Irregular periods Maternal age <15 Para 0  Other Problems (Chloe [name], RMA; 10/15/2016 4:29 PM) Anxiety Disorder Back Pain Cholelithiasis Depression Hypercholesterolemia Oophorectomy     Review of Systems (Chloe A.  [name] RMA; 10/15/2016 4:29 PM) General Not Present- Appetite Loss, Chills, Fatigue,  Fever, Night Sweats, Weight Gain and Weight Loss. Skin Not Present- Change in Wart/Mole, Dryness, Hives, Jaundice, New Lesions, Non-Healing Wounds, Rash and Ulcer. HEENT Present- Seasonal Allergies and Wears glasses/contact lenses. Not Present- Earache, Hearing Loss, Hoarseness, Nose Bleed, Oral Ulcers, Ringing in the Ears, Sinus Pain, Sore Throat, Visual Disturbances and Yellow Eyes. Respiratory Present- Snoring. Not Present- Bloody sputum, Chronic Cough, Difficulty Breathing and Wheezing. Breast Not Present- Breast Mass, Breast Pain, Nipple Discharge and Skin Changes. Cardiovascular Not Present- Chest Pain, Difficulty Breathing Lying Down, Leg Cramps, Palpitations, Rapid Heart Rate, Shortness of Breath and Swelling of Extremities. Gastrointestinal Present- Bloating and Indigestion. Not Present- Abdominal Pain, Bloody Stool, Change in Bowel Habits, Chronic diarrhea, Constipation, Difficulty Swallowing, Excessive gas, Gets full quickly at meals, Hemorrhoids, Nausea, Rectal Pain and Vomiting. Female Genitourinary Present- Urgency. Not Present- Frequency, Nocturia, Painful Urination and Pelvic Pain. Musculoskeletal Present- Back Pain, Joint Pain, Joint Stiffness, Muscle Pain, Muscle Weakness and Swelling of Extremities. Psychiatric Present- Anxiety, Bipolar and Depression. Not Present- Change in Sleep Pattern, Fearful and Frequent crying. Endocrine Present- Hot flashes. Not Present- Cold Intolerance, Excessive Hunger, Hair Changes, Heat Intolerance and New Diabetes.  Vitals (Chloe [name] RMA; 10/15/2016 4:30 PM) 10/15/2016 4:30 PM Weight: 169.6 lb Height: 61in Body Surface Area: 1.76 m Body Mass Index: 32.05 kg/m  Temp.: 97.87F  Pulse: 86 (Regular)  P.OX: 98% (Room air) BP: 128/74 (Sitting, Left Arm, Standard)      Physical Exam (Chloe Vanpelt A. Masai Kidd MD; 10/15/2016 4:45 PM)  General Mental  Status-Alert. General Appearance-Consistent with stated age. Hydration-Well hydrated. Voice-Normal.  Head and Neck Head-normocephalic, atraumatic with no lesions or palpable masses. Trachea-midline. Thyroid Gland Characteristics - normal size and consistency.  Chest and Lung Exam Chest and lung exam reveals -quiet, even and easy respiratory effort with no use of accessory muscles and on auscultation, normal breath sounds, no adventitious sounds and normal vocal resonance. Inspection Chest Wall - Normal. Back - normal.  Breast Note: Right breast in the tail of the right breast is 1 cm mobile mass. There is no other significant right axillary adenopathy. The remainder the right breast is normal. Left breast is normal.  Cardiovascular Cardiovascular examination reveals -normal heart sounds, regular rate and rhythm with no murmurs and normal pedal pulses bilaterally.  Lymphatic Head & Neck  General Head & Neck Lymphatics: Bilateral - Description - Normal. Axillary  General Axillary Region: Bilateral - Description - Normal. Tenderness - Non Tender.    Assessment & Plan (Kenneshia Rehm A. Jeda Pardue MD; 10/15/2016 4:46 PM)  BREAST MASS, RIGHT (N63.10) Impression: Recommend excision of right breast mass. Risk of lumpectomy include bleeding, infection, seroma, more surgery, use of seed/wire, wound care, cosmetic deformity and the need for other treatments, death , blood clots, death. Pt agrees to proceed.  Current Plans You are being scheduled for surgery- Our schedulers will call you.  You should hear from our office's scheduling department within 5 working days about the location, date, and time of surgery. We try to make accommodations for patient's preferences in scheduling surgery, but sometimes the OR schedule or the surgeon's schedule prevents [place] from making those accommodations.  If you have not heard from our office [phone]) in 5 working days, call the office  and ask for your surgeon's nurse.  If you have other questions about your diagnosis, plan, or surgery, call the office and ask for your surgeon's nurse.  Pt Education - [facility] Breast Biopsy HCI: discussed with patient and provided information.

## 2016-11-12 NOTE — Interval H&P Note (Signed)
History and Physical Interval Note:  11/12/2016 9:11 AM  Chloe [name]  has presented today for surgery, with the diagnosis of right breast mass  The various methods of treatment have been discussed with the patient and family. After consideration of risks, benefits and other options for treatment, the patient has consented to  Procedure(s): RIGHT BREAST LUMPECTOMY WITH RADIOACTIVE SEED LOCALIZATION ERAS PATHWAY (Right) as a surgical intervention .  The patient's history has been reviewed, patient examined, no change in status, stable for surgery.  I have reviewed the patient's chart and labs.  Questions were answered to the patient's satisfaction.     Nuno Brubacher A.

## 2016-11-12 NOTE — Anesthesia Preprocedure Evaluation (Signed)
Anesthesia Evaluation  Patient identified by MRN, date of birth, ID band Patient awake    Reviewed: Allergy & Precautions, NPO status , Patient's Chart, lab work & pertinent test results  Airway Mallampati: II  TM Distance: >3 FB Neck ROM: Full    Dental no notable dental hx. (+) Teeth Intact   Pulmonary Current Smoker,    Pulmonary exam normal breath sounds clear to auscultation       Cardiovascular negative cardio ROS Normal cardiovascular exam Rhythm:Regular Rate:Normal     Neuro/Psych PSYCHIATRIC DISORDERS Anxiety Bipolar Disorder negative neurological ROS     GI/Hepatic Neg liver ROS, GERD  Medicated and Controlled,  Endo/Other  Right Breast mass Hyperlipidemia Obesity  Renal/GU negative Renal ROS  negative genitourinary   Musculoskeletal negative musculoskeletal ROS (+)   Abdominal (+) + obese,   Peds  Hematology negative hematology ROS (+)   Anesthesia Other Findings   Reproductive/Obstetrics negative OB ROS                             Anesthesia Physical Anesthesia Plan  ASA: II  Anesthesia Plan: General   Post-op Pain Management:    Induction: Intravenous  PONV Risk Score and Plan: 3 and Ondansetron, Dexamethasone, Midazolam and Propofol infusion  Airway Management Planned: LMA  Additional Equipment:   Intra-op Plan:   Post-operative Plan: Extubation in OR  Informed Consent: I have reviewed the patients History and Physical, chart, labs and discussed the procedure including the risks, benefits and alternatives for the proposed anesthesia with the patient or authorized representative who has indicated his/her understanding and acceptance.   Dental advisory given  Plan Discussed with: CRNA, Anesthesiologist and Surgeon  Anesthesia Plan Comments:         Anesthesia Quick Evaluation

## 2016-11-13 ENCOUNTER — Other Ambulatory Visit (HOSPITAL_COMMUNITY): Payer: Self-pay | Admitting: Psychiatry

## 2016-11-14 ENCOUNTER — Encounter (HOSPITAL_BASED_OUTPATIENT_CLINIC_OR_DEPARTMENT_OTHER): Payer: Self-pay | Admitting: Surgery

## 2017-02-28 ENCOUNTER — Other Ambulatory Visit (HOSPITAL_COMMUNITY): Payer: Self-pay | Admitting: Psychiatry

## 2017-04-03 ENCOUNTER — Ambulatory Visit: Payer: BLUE CROSS/BLUE SHIELD | Admitting: Podiatry

## 2017-04-04 ENCOUNTER — Ambulatory Visit: Payer: BLUE CROSS/BLUE SHIELD | Admitting: Podiatry

## 2017-04-04 ENCOUNTER — Encounter: Payer: Self-pay | Admitting: Podiatry

## 2017-04-04 ENCOUNTER — Other Ambulatory Visit: Payer: Self-pay | Admitting: Nurse Practitioner

## 2017-04-04 ENCOUNTER — Ambulatory Visit
Admission: RE | Admit: 2017-04-04 | Discharge: 2017-04-04 | Disposition: A | Discharge status: Home / Self Care | Payer: BLUE CROSS/BLUE SHIELD | Source: Ambulatory Visit | Attending: Nurse Practitioner | Admitting: Nurse Practitioner

## 2017-04-04 DIAGNOSIS — Z87891 Personal history of nicotine dependence: Secondary | ICD-10-CM

## 2017-04-04 DIAGNOSIS — L600 Ingrowing nail: Secondary | ICD-10-CM

## 2017-04-04 DIAGNOSIS — M549 Dorsalgia, unspecified: Secondary | ICD-10-CM

## 2017-04-04 NOTE — Progress Notes (Signed)
Subjective:   Patient ID: Sabino Dickheresa P Aydelott, female   DOB: 52 y.o.   MRN: [id number]   HPI Patient states overall doing well but the hallux nails have partially regrown and are irritating the distal tips and making it hard to wear shoe gear comfortably and is hard for her to trim   ROS      Objective:  Physical Exam  Neurovascular status intact with the second and third nails bilateral doing very well hallux nail showing a central portion which unfortunately has regrown which can happen with this kind of procedure with irritation of the distal tip     Assessment:  Nail disease of the hallux bilateral with satisfactory removal of lateral medial borders with some nail in the center that irritative     Plan:  Reviewed condition and recommended removal of the nails and patient wants surgery.  I explained procedure and risk and she signed consent form and today I infiltrated 60 mg Xylocaine Marcaine mixture each hallux and then did sterile prep on each big toe and using sterile instrumentation removed the hallux nails exposed matrix and applied phenol for applications 30 seconds followed by alcohol lavage and sterile dressing.  Gave instructions on soaks and reappoint

## 2017-04-04 NOTE — Patient Instructions (Signed)

## 2017-05-14 ENCOUNTER — Encounter: Payer: Self-pay | Admitting: Internal Medicine

## 2017-07-18 ENCOUNTER — Ambulatory Visit (AMBULATORY_SURGERY_CENTER): Payer: Self-pay | Admitting: *Deleted

## 2017-07-18 ENCOUNTER — Other Ambulatory Visit: Payer: Self-pay

## 2017-07-18 ENCOUNTER — Telehealth: Payer: Self-pay | Admitting: *Deleted

## 2017-07-18 VITALS — Ht 61.0 in | Wt 170.0 lb

## 2017-07-18 DIAGNOSIS — M5430 Sciatica, unspecified side: Secondary | ICD-10-CM | POA: Insufficient documentation

## 2017-07-18 DIAGNOSIS — M549 Dorsalgia, unspecified: Secondary | ICD-10-CM | POA: Insufficient documentation

## 2017-07-18 DIAGNOSIS — Z1211 Encounter for screening for malignant neoplasm of colon: Secondary | ICD-10-CM

## 2017-07-18 NOTE — Progress Notes (Signed)
Patient denies any allergies to eggs or soy. Patient denies any problems with anesthesia/sedation. Patient denies any oxygen use at home. Patient denies taking any diet/weight loss medications or blood thinners. EMMI education assisgned to patient on colonoscopy, this was explained and instructions given to patient. Release of information signed and given to PJ, CMA. Patient denies any cancer.

## 2017-07-18 NOTE — Telephone Encounter (Signed)
Patient had PV today. She states she had colonoscopy with Dr. Jeani HawkingPatrick Hung about 10 years ago and had polyps. Release of information form signed and given to P.J. CMA.

## 2017-07-18 NOTE — Telephone Encounter (Signed)
ROI faxed to Dr Haywood Pao office.

## 2017-07-30 NOTE — Telephone Encounter (Signed)
  [facility] and placed them in Dr Marvell FullerGessner's office.

## 2017-08-01 ENCOUNTER — Encounter: Payer: BLUE CROSS/BLUE SHIELD | Admitting: Internal Medicine

## 2017-08-28 ENCOUNTER — Ambulatory Visit (AMBULATORY_SURGERY_CENTER): Payer: BLUE CROSS/BLUE SHIELD | Admitting: Internal Medicine

## 2017-08-28 ENCOUNTER — Encounter: Payer: Self-pay | Admitting: Internal Medicine

## 2017-08-28 VITALS — BP 128/65 | HR 66 | Temp 98.9°F | Resp 15 | Ht 60.0 in | Wt 175.0 lb

## 2017-08-28 DIAGNOSIS — Z1211 Encounter for screening for malignant neoplasm of colon: Secondary | ICD-10-CM | POA: Diagnosis not present

## 2017-08-28 MED ORDER — SODIUM CHLORIDE 0.9 % IV SOLN: 500.0000 mL | Freq: Once | INTRAVENOUS | Status: DC

## 2017-08-28 NOTE — Patient Instructions (Addendum)
   The colonoscopy was normal.  Next routine colonoscopy or other screening test in 10 years - 2029  I appreciate the opportunity to care for you. Jessen Siegman E. Laniya Friedl, MD, FACG   YOU HAD AN ENDOSCOPIC PROCEDURE TODAY AT THE [facility]:   Refer to the procedure report that was given to you for any specific questions about what was found during the examination.  If the procedure report does not answer your questions, please call your gastroenterologist to clarify.  If you requested that your care partner not be given the details of your procedure findings, then the procedure report has been included in a sealed envelope for you to review at your convenience later.  YOU SHOULD EXPECT: Some feelings of bloating in the abdomen. Passage of more gas than usual.  Walking can help get rid of the air that was put into your GI tract during the procedure and reduce the bloating. If you had a lower endoscopy (such as a colonoscopy or flexible sigmoidoscopy) you may notice spotting of blood in your stool or on the toilet paper. If you underwent a bowel prep for your procedure, you may not have a normal bowel movement for a few days.  Please Note:  You might notice some irritation and congestion in your nose or some drainage.  This is from the oxygen used during your procedure.  There is no need for concern and it should clear up in a day or so.  SYMPTOMS TO REPORT IMMEDIATELY:   Following lower endoscopy (colonoscopy or flexible sigmoidoscopy):  Excessive amounts of blood in the stool  Significant tenderness or worsening of abdominal pains  Swelling of the abdomen that is new, acute  Fever of 100F or higher   For urgent or emergent issues, a gastroenterologist can be reached at any hour by calling [phone].   DIET:  We do recommend a small meal at first, but then you may proceed to your regular diet.  Drink plenty of fluids but you should avoid alcoholic beverages for 24  hours.  ACTIVITY:  You should plan to take it easy for the rest of today and you should NOT DRIVE or use heavy machinery until tomorrow (because of the sedation medicines used during the test).    FOLLOW UP: Our staff will call the number listed on your records the next business day following your procedure to check on you and address any questions or concerns that you may have regarding the information given to you following your procedure. If we do not reach you, we will leave a message.  However, if you are feeling well and you are not experiencing any problems, there is no need to return our call.  We will assume that you have returned to your regular daily activities without incident.  If any biopsies were taken you will be contacted by phone or by letter within the next 1-3 weeks.  Please call us at [phone] if you have not heard about the biopsies in 3 weeks.    SIGNATURES/CONFIDENTIALITY: You and/or your care partner have signed paperwork which will be entered into your electronic medical record.  These signatures attest to the fact that that the information above on your After Visit Summary has been reviewed and is understood.  Full responsibility of the confidentiality of this discharge information lies with you and/or your care-partner. 

## 2017-08-28 NOTE — Op Note (Signed)
[facility] Patient Name: Chloe [name] Procedure Date: 08/28/2017 8:39 AM MRN: [id number] Endoscopist: Iva Boop , MD Age: 52 Referring MD:  Date of Birth: Jan 15, 1966 Gender: Female Account #: [id number] Procedure:                Colonoscopy Indications:              Screening for colorectal malignant neoplasm, This                            is the patient's first colonoscopy Medicines:                Propofol per Anesthesia, Monitored Anesthesia Care Procedure:                Pre-Anesthesia Assessment:                           - Prior to the procedure, a History and Physical                            was performed, and patient medications and                            allergies were reviewed. The patient's tolerance of                            previous anesthesia was also reviewed. The risks                            and benefits of the procedure and the sedation                            options and risks were discussed with the patient.                            All questions were answered, and informed consent                            was obtained. Prior Anticoagulants: The patient has                            taken no previous anticoagulant or antiplatelet                            agents. ASA Grade Assessment: II - A patient with                            mild systemic disease. After reviewing the risks                            and benefits, the patient was deemed in                            satisfactory condition to undergo the procedure.  After obtaining informed consent, the colonoscope                            was passed under direct vision. Throughout the                            procedure, the patient's blood pressure, pulse, and                            oxygen saturations were monitored continuously. The                            Colonoscope was introduced through the anus and   advanced to the the cecum, identified by                            appendiceal orifice and ileocecal valve. The                            quality of the bowel preparation was excellent. The                            colonoscopy was performed without difficulty. The                            patient tolerated the procedure well. The bowel                            preparation used was Miralax. Scope In: 8:55:21 AM Scope Out: 9:08:41 AM Scope Withdrawal Time: 0 hours 10 minutes 59 seconds  Total Procedure Duration: 0 hours 13 minutes 20 seconds  Findings:                 The perianal and digital rectal examinations were                            normal.                           The colon (entire examined portion) appeared normal.                           No additional abnormalities were found on                            retroflexion. Complications:            No immediate complications. Estimated blood loss:                            None. Estimated Blood Loss:     Estimated blood loss: none. Recommendation:           - Repeat colonoscopy in 10 years for screening                            purposes.                           -  Patient has a contact number available for                            emergencies. The signs and symptoms of potential                            delayed complications were discussed with the                            patient. Return to normal activities tomorrow.                            Written discharge instructions were provided to the                            patient.                           - Resume previous diet.                           - Continue present medications. Iva Booparl E Gessner, MD 08/28/2017 9:11:47 AM This report has been signed electronically.

## 2017-08-28 NOTE — Progress Notes (Signed)
Pt's states no medical or surgical changes since previsit or office visit. 

## 2017-08-28 NOTE — Progress Notes (Signed)
Pt taken to recovery, VSS, A&O, pleased with MAC. Report given to RN.  

## 2017-08-29 ENCOUNTER — Telehealth: Payer: Self-pay

## 2017-08-29 NOTE — Telephone Encounter (Signed)
  Follow up Call-  Call back number 08/28/2017  Post procedure Call Back phone  # [phone]  Permission to leave phone message Yes  Some recent data might be hidden     Patient questions:  Do you have a fever, pain , or abdominal swelling? No. Pain Score  0 *  Have you tolerated food without any problems? Yes.    Have you been able to return to your normal activities? Yes.    Do you have any questions about your discharge instructions: Diet   No. Medications  No. Follow up visit  No.  Do you have questions or concerns about your Care? No.  Actions: * If pain score is 4 or above: No action needed, pain <4.

## 2017-11-24 ENCOUNTER — Encounter (HOSPITAL_COMMUNITY): Payer: Self-pay

## 2017-11-24 ENCOUNTER — Inpatient Hospital Stay (HOSPITAL_COMMUNITY)
Admission: AD | Admit: 2017-11-24 | Discharge: 2017-12-02 | DRG: 885 | Disposition: A | Discharge status: Home / Self Care | Payer: BLUE CROSS/BLUE SHIELD | Attending: Psychiatry | Admitting: Psychiatry

## 2017-11-24 ENCOUNTER — Encounter (HOSPITAL_COMMUNITY): Payer: Self-pay | Admitting: Emergency Medicine

## 2017-11-24 ENCOUNTER — Other Ambulatory Visit: Payer: Self-pay

## 2017-11-24 ENCOUNTER — Emergency Department (HOSPITAL_COMMUNITY)
Admission: EM | Admit: 2017-11-24 | Discharge: 2017-11-24 | Disposition: A | Discharge status: Other Acute Inpatient Hospital | Payer: BLUE CROSS/BLUE SHIELD | Source: Home / Self Care | Attending: Emergency Medicine | Admitting: Emergency Medicine

## 2017-11-24 DIAGNOSIS — F419 Anxiety disorder, unspecified: Secondary | ICD-10-CM | POA: Diagnosis present

## 2017-11-24 DIAGNOSIS — E785 Hyperlipidemia, unspecified: Secondary | ICD-10-CM | POA: Diagnosis present

## 2017-11-24 DIAGNOSIS — F17210 Nicotine dependence, cigarettes, uncomplicated: Secondary | ICD-10-CM

## 2017-11-24 DIAGNOSIS — F319 Bipolar disorder, unspecified: Secondary | ICD-10-CM | POA: Diagnosis present

## 2017-11-24 DIAGNOSIS — G4700 Insomnia, unspecified: Secondary | ICD-10-CM | POA: Diagnosis present

## 2017-11-24 DIAGNOSIS — K219 Gastro-esophageal reflux disease without esophagitis: Secondary | ICD-10-CM | POA: Diagnosis present

## 2017-11-24 DIAGNOSIS — Z91410 Personal history of adult physical and sexual abuse: Secondary | ICD-10-CM | POA: Diagnosis not present

## 2017-11-24 DIAGNOSIS — F3110 Bipolar disorder, current episode manic without psychotic features, unspecified: Secondary | ICD-10-CM

## 2017-11-24 DIAGNOSIS — F312 Bipolar disorder, current episode manic severe with psychotic features: Principal | ICD-10-CM | POA: Diagnosis present

## 2017-11-24 DIAGNOSIS — Z79899 Other long term (current) drug therapy: Secondary | ICD-10-CM

## 2017-11-24 DIAGNOSIS — R45851 Suicidal ideations: Secondary | ICD-10-CM

## 2017-11-24 DIAGNOSIS — J45909 Unspecified asthma, uncomplicated: Secondary | ICD-10-CM | POA: Diagnosis present

## 2017-11-24 DIAGNOSIS — F315 Bipolar disorder, current episode depressed, severe, with psychotic features: Secondary | ICD-10-CM | POA: Diagnosis present

## 2017-11-24 DIAGNOSIS — I10 Essential (primary) hypertension: Secondary | ICD-10-CM | POA: Diagnosis present

## 2017-11-24 DIAGNOSIS — Z23 Encounter for immunization: Secondary | ICD-10-CM

## 2017-11-24 DIAGNOSIS — Z915 Personal history of self-harm: Secondary | ICD-10-CM

## 2017-11-24 DIAGNOSIS — Z90710 Acquired absence of both cervix and uterus: Secondary | ICD-10-CM

## 2017-11-24 LAB — URINALYSIS, ROUTINE W REFLEX MICROSCOPIC
Glucose, UA: NEGATIVE mg/dL
KETONES UR: 5 mg/dL — AB
Leukocytes, UA: NEGATIVE
Nitrite: NEGATIVE
PH: 5 (ref 5.0–8.0)
PROTEIN: 30 mg/dL — AB
Specific Gravity, Urine: 1.025 (ref 1.005–1.030)

## 2017-11-24 LAB — BASIC METABOLIC PANEL
ANION GAP: 10 (ref 5–15)
BUN: 9 mg/dL (ref 6–20)
CALCIUM: 9.4 mg/dL (ref 8.9–10.3)
CO2: 26 mmol/L (ref 22–32)
Chloride: 104 mmol/L (ref 98–111)
Creatinine, Ser: 0.56 mg/dL (ref 0.44–1.00)
GFR calc Af Amer: >60 mL/min (ref >60)
GFR calc non Af Amer: >60 mL/min (ref >60)
GLUCOSE: 105 mg/dL — AB (ref 70–99)
Potassium: 3.4 mmol/L — ABNORMAL LOW (ref 3.5–5.1)
Sodium: 140 mmol/L (ref 135–145)

## 2017-11-24 LAB — CBC WITH DIFFERENTIAL/PLATELET
BASOS ABS: 0.1 10*3/uL (ref 0.0–0.1)
BASOS PCT: 1 %
EOS PCT: 2 %
Eosinophils Absolute: 0.2 10*3/uL (ref 0.0–0.7)
HCT: 46.2 % — ABNORMAL HIGH (ref 36.0–46.0)
Hemoglobin: 16.6 g/dL — ABNORMAL HIGH (ref 12.0–15.0)
LYMPHS PCT: 29 %
Lymphs Abs: 3.6 10*3/uL (ref 0.7–4.0)
MCH: 31.9 pg (ref 26.0–34.0)
MCHC: 35.9 g/dL (ref 30.0–36.0)
MCV: 88.8 fL (ref 78.0–100.0)
MONO ABS: 1 10*3/uL (ref 0.1–1.0)
Monocytes Relative: 8 %
Neutro Abs: 7.6 10*3/uL (ref 1.7–7.7)
Neutrophils Relative %: 60 %
PLATELETS: 405 10*3/uL — AB (ref 150–400)
RBC: 5.2 MIL/uL — ABNORMAL HIGH (ref 3.87–5.11)
RDW: 12.9 % (ref 11.5–15.5)
WBC: 12.5 10*3/uL — ABNORMAL HIGH (ref 4.0–10.5)

## 2017-11-24 LAB — RAPID URINE DRUG SCREEN, HOSP PERFORMED
Amphetamines: NOT DETECTED
BENZODIAZEPINES: POSITIVE — AB
Barbiturates: NOT DETECTED
COCAINE: NOT DETECTED
OPIATES: NOT DETECTED
Tetrahydrocannabinol: NOT DETECTED

## 2017-11-24 LAB — ACETAMINOPHEN LEVEL

## 2017-11-24 LAB — I-STAT BETA HCG BLOOD, ED (MC, WL, AP ONLY): I-stat hCG, quantitative: <5 m[IU]/mL (ref <5)

## 2017-11-24 LAB — ETHANOL: Alcohol, Ethyl (B): <10 mg/dL (ref <10)

## 2017-11-24 LAB — SALICYLATE LEVEL: Salicylate Lvl: <7 mg/dL (ref 2.8–30.0)

## 2017-11-24 MED ORDER — ONDANSETRON HCL 4 MG PO TABS: 4.0000 mg | ORAL_TABLET | Freq: Three times a day (TID) | ORAL | Status: DC | PRN

## 2017-11-24 MED ORDER — LAMOTRIGINE 200 MG PO TABS
200.0000 mg | ORAL_TABLET | Freq: Every day | ORAL | Status: DC
  Administered 2017-11-24 – 2017-12-02 (×9): 200 mg via ORAL
  Filled 2017-11-24 (×3): qty 1
  Filled 2017-11-24: qty 2
  Filled 2017-11-24 (×7): qty 1

## 2017-11-24 MED ORDER — PRAVASTATIN SODIUM 20 MG PO TABS
20.0000 mg | ORAL_TABLET | Freq: Every day | ORAL | Status: DC
  Administered 2017-11-24 – 2017-12-02 (×9): 20 mg via ORAL
  Filled 2017-11-24 (×11): qty 1

## 2017-11-24 MED ORDER — CARBAMAZEPINE 200 MG PO TABS
200.0000 mg | ORAL_TABLET | Freq: Two times a day (BID) | ORAL | Status: DC
  Administered 2017-11-24 – 2017-12-02 (×16): 200 mg via ORAL
  Filled 2017-11-24 (×19): qty 1

## 2017-11-24 MED ORDER — HYDROXYZINE HCL 25 MG PO TABS
25.0000 mg | ORAL_TABLET | Freq: Three times a day (TID) | ORAL | Status: DC | PRN
  Administered 2017-11-25 – 2017-11-30 (×2): 25 mg via ORAL
  Filled 2017-11-24 (×2): qty 1

## 2017-11-24 MED ORDER — ACETAMINOPHEN 325 MG PO TABS: 650.0000 mg | ORAL_TABLET | ORAL | Status: DC | PRN

## 2017-11-24 MED ORDER — INFLUENZA VAC SPLIT QUAD 0.5 ML IM SUSY
0.5000 mL | PREFILLED_SYRINGE | INTRAMUSCULAR | Status: AC
  Administered 2017-11-26: 0.5 mL via INTRAMUSCULAR
  Filled 2017-11-24: qty 0.5

## 2017-11-24 MED ORDER — NICOTINE 21 MG/24HR TD PT24: 21.0000 mg | MEDICATED_PATCH | Freq: Every day | TRANSDERMAL | Status: DC

## 2017-11-24 MED ORDER — ALUM & MAG HYDROXIDE-SIMETH 200-200-20 MG/5ML PO SUSP: 30.0000 mL | Freq: Four times a day (QID) | ORAL | Status: DC | PRN

## 2017-11-24 MED ORDER — ACETAMINOPHEN 325 MG PO TABS
650.0000 mg | ORAL_TABLET | ORAL | Status: DC | PRN
  Administered 2017-11-30 – 2017-12-01 (×2): 650 mg via ORAL
  Filled 2017-11-24 (×2): qty 2

## 2017-11-24 MED ORDER — PANTOPRAZOLE SODIUM 40 MG PO TBEC
40.0000 mg | DELAYED_RELEASE_TABLET | Freq: Every day | ORAL | Status: DC
  Administered 2017-11-24 – 2017-12-02 (×9): 40 mg via ORAL
  Filled 2017-11-24 (×11): qty 1

## 2017-11-24 MED ORDER — NICOTINE 21 MG/24HR TD PT24
21.0000 mg | MEDICATED_PATCH | Freq: Every day | TRANSDERMAL | Status: DC
  Administered 2017-11-25 – 2017-12-02 (×8): 21 mg via TRANSDERMAL
  Filled 2017-11-24 (×9): qty 1

## 2017-11-24 MED ORDER — MAGNESIUM HYDROXIDE 400 MG/5ML PO SUSP: 30.0000 mL | Freq: Every day | ORAL | Status: DC | PRN

## 2017-11-24 MED ORDER — PNEUMOCOCCAL VAC POLYVALENT 25 MCG/0.5ML IJ INJ
0.5000 mL | INJECTION | INTRAMUSCULAR | Status: AC
  Administered 2017-11-26: 0.5 mL via INTRAMUSCULAR

## 2017-11-24 MED ORDER — TRAZODONE HCL 50 MG PO TABS
50.0000 mg | ORAL_TABLET | Freq: Every evening | ORAL | Status: DC | PRN
  Administered 2017-11-24 – 2017-11-27 (×4): 50 mg via ORAL
  Filled 2017-11-24 (×11): qty 1

## 2017-11-24 MED ORDER — BUPROPION HCL ER (XL) 300 MG PO TB24
300.0000 mg | ORAL_TABLET | Freq: Every day | ORAL | Status: DC
  Administered 2017-11-24 – 2017-11-25 (×2): 300 mg via ORAL
  Filled 2017-11-24 (×4): qty 1

## 2017-11-24 NOTE — B[facility] Group Notes (Signed)
LCSW Group Therapy Note  11/24/2017 1:15pm  Type of Therapy and Topic:  Group Therapy:  Feelings around Relapse and Recovery  Participation Level:  Minimal   Description of Group:    Patients in this group will discuss emotions they experience before and after a relapse. They will process how experiencing these feelings, or avoidance of experiencing them, relates to having a relapse. Facilitator will guide patients to explore emotions they have related to recovery. Patients will be encouraged to process which emotions are more powerful. They will be guided to discuss the emotional reaction significant others in their lives may have to their relapse or recovery. Patients will be assisted in exploring ways to respond to the emotions of others without this contributing to a relapse.  Therapeutic Goals: 1. Patient will identify two or more emotions that lead to a relapse for them 2. Patient will identify two emotions that result when they relapse 3. Patient will identify two emotions related to recovery 4. Patient will demonstrate ability to communicate their needs through discussion and/or role plays   Summary of Patient Progress:  Slipped in about three quarters of the way through group.  When asked if she had anything to contribute, talked about "my head getting jumbled because I have people on both sides talking to me and it just gets confusing."  She was apparently talking about family members. Also admitted to not always taking medication as prescribed.   Therapeutic Modalities:   Cognitive Behavioral Therapy Solution-Focused Therapy Assertiveness Training Relapse Prevention Therapy   Ida Rogue, LCSW 11/24/2017 3:15 PM

## 2017-11-24 NOTE — ED Notes (Signed)
istat HCG collect

## 2017-11-24 NOTE — BH Assessment (Addendum)
[facility] Assessment Progress Note  Per Juanetta Beets, DO, this pt requires psychiatric hospitalization.  Per TTS Assessment, Malva Limes, RN, [facility] has assigned pt to [facility] Rm 506-1.  Pt presents under IVC initiated by pt's spouse, and upheld by Dr Sharma Covert, and IVC documents have been faxed to [facility].  Pt's nurse, Kendal Hymen, has been notified, and agrees to call report to [phone].  Pt is to be transported via [employer].   Doylene Canning, [place] [facility] Coordinator [phone]

## 2017-11-24 NOTE — Progress Notes (Signed)
D:  Chloe [name] did come to evening wrap up group.  She stated she was doing fine and declined anything to help her sleep.  She was able to fall asleep but woke up to a noise and became paranoid.  She reported worrying that people are coming into her room.  She was found standing in the corner of her room and then sitting quietly in the bathroom with the lights off.  She reported also hearing drums playing.  She has been noted going back and forth from bed to bed.  Staff with Barbara Cower NP and no roommate order obtained due to the paranoia and inability to tolerate a roommate at this time.  She finally agreed to take trazodone for sleep but declined any other prns.  She denied SI/HI and denied any pain or discomfort.   A:  1:1 with RN for support and encouragement.  Medications as ordered.  Q 15 minute checks maintained for safety.  Encouraged participation in group and unit activities.   R:  [name] remains safe on the unit.  We will continue to monitor the progress towards her goals.
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
## 2017-11-24 NOTE — Progress Notes (Signed)
Patient ID: Chloe [name], female   DOB: Sep 30, 1965, 52 y.o.   MRN: [id number] Patient was admitted to the unit under IVC due to increasingly bizarre behaviors.  Patient currently denies SI, HI an AVH.  Patient does present as suspicious and reports "They are trying to get information."  When asked who they were, patient did not elaborate.   Skin assessment complete and patient found to be free of all injury and contraband.    Patient admitted to the unit without incident.

## 2017-11-24 NOTE — ED Provider Notes (Signed)
[facility]-EMERGENCY DEPT Provider Note   CSN: [id number] Arrival date & time: 11/24/17  0158     History   Chief Complaint Chief Complaint  Patient presents with  . Suicidal  . IVC    HPI Chloe [name] is a 52 y.o. female.  The history is provided by the patient, medical records and the police.  Mental Health Problem  Presenting symptoms: disorganized thought process, hallucinations, homicidal ideas and suicidal threats   Presenting symptoms: no self-mutilation   Patient accompanied by:  Law enforcement Degree of incapacity (severity):  Severe Onset quality:  Gradual Timing:  Constant Progression:  Unchanged Context: noncompliance   Treatment compliance:  Untreated Relieved by:  Nothing Worsened by:  Nothing Ineffective treatments:  None tried Associated symptoms: no abdominal pain, no chest pain, no feelings of worthlessness and no headaches   Risk factors: hx of mental illness   Patient under IVC by husband for being off meds and breaking into a neighbor's house and then patient both suicidal and homicidal threats against husband.    Past Medical History:  Diagnosis Date  . Anxiety   . Bipolar disorder (HCC)   . Breast mass, right   . GERD (gastroesophageal reflux disease)   . Hyperlipidemia   . Post-operative nausea and vomiting   . Smoker     Patient Active Problem List   Diagnosis Date Noted  . Acute sciatica 07/18/2017  . Backache with radiation 07/18/2017    Past Surgical History:  Procedure Laterality Date  . ABDOMINAL HYSTERECTOMY     had nausea and vomiting post-op  . BREAST LUMPECTOMY WITH RADIOACTIVE SEED LOCALIZATION Right 11/12/2016   Procedure: RIGHT BREAST LUMPECTOMY WITH RADIOACTIVE SEED LOCALIZATION;  Surgeon: Harriette Bouillon, MD;  Location: [facility];  Service: General;  Laterality: Right;  . CHOLECYSTECTOMY    . LAPAROSCOPIC LYSIS OF ADHESIONS    . MYOMECTOMY    . PLANTAR FASCIA SURGERY Left        OB History   None      Home Medications    Prior to Admission medications   Medication Sig Start Date End Date Taking? Authorizing Provider  albuterol (PROVENTIL HFA;VENTOLIN HFA) 108 (90 Base) MCG/ACT inhaler Inhale into the lungs every 6 (six) hours as needed for wheezing or shortness of breath.    [provider]  ALPRAZolam Prudy Feeler) 0.5 MG tablet Take 1 tablet by mouth as needed. 06/02/17   [provider]  buPROPion (WELLBUTRIN SR) 150 MG 12 hr tablet Take 150 mg by mouth 3 (three) times daily.    [provider]  estradiol (VIVELLE-DOT) 0.05 MG/24HR patch Place 1 patch onto the skin 2 (two) times a week.    [provider]  lamoTRIgine (LAMICTAL) 200 MG tablet Take 200 mg by mouth daily.     [provider]  Omeprazole (PRILOSEC PO) Take 20 mg by mouth.     [provider]  pravastatin (PRAVACHOL) 40 MG tablet Take 40 mg by mouth daily.    [provider]    Family History Family History  Problem Relation Age of Onset  . Lung cancer Mother   . Hyperlipidemia Brother   . Colon cancer Neg Hx   . Esophageal cancer Neg Hx   . Stomach cancer Neg Hx     Social History Social History   Tobacco Use  . Smoking status: Current Every Day Smoker    Packs/day: 0.50    Types: Cigarettes  . Smokeless  tobacco: Never Used  Substance Use Topics  . Alcohol use: Not Currently    Comment: social  . Drug use: No     Allergies   Penicillins   Review of Systems Review of Systems  Constitutional: Negative for fever.  Eyes: Negative for photophobia.  Respiratory: Negative for shortness of breath.   Cardiovascular: Negative for chest pain.  Gastrointestinal: Negative for abdominal pain.  Musculoskeletal: Negative for neck pain.  Neurological: Negative for dizziness and headaches.  Psychiatric/Behavioral: Positive for hallucinations and homicidal ideas. Negative for behavioral problems, confusion and self-injury.   All other systems reviewed and are negative.    Physical Exam Updated Vital Signs BP (!) 143/86 (BP Location: Right Arm)   Pulse 87   Resp 16   SpO2 96%   Physical Exam  Constitutional: She is oriented to person, place, and time. She appears well-developed and well-nourished. No distress.  HENT:  Head: Normocephalic and atraumatic.  Mouth/Throat: No oropharyngeal exudate.  Eyes: Pupils are equal, round, and reactive to light. Conjunctivae and EOM are normal.  Neck: Normal range of motion. Neck supple.  Cardiovascular: Normal rate, regular rhythm, normal heart sounds and intact distal pulses.  Pulmonary/Chest: Effort normal and breath sounds normal. No stridor. She has no wheezes. She has no rales.  Abdominal: Soft. Bowel sounds are normal. She exhibits no mass. There is no tenderness. There is no rebound and no guarding.  Musculoskeletal: Normal range of motion.  Neurological: She is alert and oriented to person, place, and time. She displays normal reflexes.  Skin: Skin is warm and dry. Capillary refill takes less than 2 seconds.  Psychiatric: Her speech is rapid and/or pressured and tangential. She is agitated.     ED Treatments / Results  Labs (all labs ordered are listed, but only abnormal results are displayed) Results for orders placed or performed during the hospital encounter of 11/24/17  CBC with Differential  Result Value Ref Range   WBC 12.5 (H) 4.0 - 10.5 K/uL   RBC 5.20 (H) 3.87 - 5.11 MIL/uL   Hemoglobin 16.6 (H) 12.0 - 15.0 g/dL   HCT 40.9 (H) 81.1 - 91.4 %   MCV 88.8 78.0 - 100.0 fL   MCH 31.9 26.0 - 34.0 pg   MCHC 35.9 30.0 - 36.0 g/dL   RDW 78.2 95.6 - 21.3 %   Platelets 405 (H) 150 - 400 K/uL   Neutrophils Relative % 60 %   Neutro Abs 7.6 1.7 - 7.7 K/uL   Lymphocytes Relative 29 %   Lymphs Abs 3.6 0.7 - 4.0 K/uL   Monocytes Relative 8 %   Monocytes Absolute 1.0 0.1 - 1.0 K/uL   Eosinophils Relative 2 %   Eosinophils Absolute 0.2 0.0 - 0.7 K/uL    Basophils Relative 1 %   Basophils Absolute 0.1 0.0 - 0.1 K/uL  Basic metabolic panel  Result Value Ref Range   Sodium 140 135 - 145 mmol/L   Potassium 3.4 (L) 3.5 - 5.1 mmol/L   Chloride 104 98 - 111 mmol/L   CO2 26 22 - 32 mmol/L   Glucose, Bld 105 (H) 70 - 99 mg/dL   BUN 9 6 - 20 mg/dL   Creatinine, Ser 0.86 0.44 - 1.00 mg/dL   Calcium 9.4 8.9 - 57.8 mg/dL   GFR calc non Af Amer >60 >60 mL/min   GFR calc Af Amer >60 >60 mL/min   Anion gap 10 5 - 15  Ethanol  Result Value Ref Range  Alcohol, Ethyl (B) <10 <10 mg/dL  Salicylate level  Result Value Ref Range   Salicylate Lvl <7.0 2.8 - 30.0 mg/dL  Acetaminophen level  Result Value Ref Range   Acetaminophen (Tylenol), Serum <10 (L) 10 - 30 ug/mL  Urinalysis, Routine w reflex microscopic  Result Value Ref Range   Color, Urine YELLOW YELLOW   APPearance HAZY (A) CLEAR   Specific Gravity, Urine 1.025 1.005 - 1.030   pH 5.0 5.0 - 8.0   Glucose, UA NEGATIVE NEGATIVE mg/dL   Hgb urine dipstick SMALL (A) NEGATIVE   Bilirubin Urine SMALL (A) NEGATIVE   Ketones, ur 5 (A) NEGATIVE mg/dL   Protein, ur 30 (A) NEGATIVE mg/dL   Nitrite NEGATIVE NEGATIVE   Leukocytes, UA NEGATIVE NEGATIVE   RBC / HPF 0-5 0 - 5 RBC/hpf   WBC, UA 0-5 0 - 5 WBC/hpf   Bacteria, UA RARE (A) NONE SEEN   Squamous Epithelial / LPF 0-5 0 - 5   Mucus PRESENT   Rapid urine drug screen (hospital performed)  Result Value Ref Range   Opiates NONE DETECTED NONE DETECTED   Cocaine NONE DETECTED NONE DETECTED   Benzodiazepines POSITIVE (A) NONE DETECTED   Amphetamines NONE DETECTED NONE DETECTED   Tetrahydrocannabinol NONE DETECTED NONE DETECTED   Barbiturates NONE DETECTED NONE DETECTED   No results found.  EKG  EKG Interpretation  Date/Time:  Monday November 24 2017 02:42:21 EDT Ventricular Rate:  77 PR Interval:    QRS Duration: 93 QT Interval:  377 QTC Calculation: 427 R Axis:   42 Text Interpretation:  Sinus rhythm Confirmed by Nicanor Alcon, Arren Laminack  ([id number]) on 11/24/2017 2:45:02 AM       Radiology No results found.  Procedures Procedures (including critical care time)    Final Clinical Impressions(s) / ED Diagnoses   Medically cleared by me for psychiatry.  Holding orders placed.     Kallen Mccrystal, MD 11/24/17 [phone]

## 2017-11-24 NOTE — Progress Notes (Signed)
IVC paperwork has been faxed to [facility] for review

## 2017-11-24 NOTE — ED Notes (Signed)
Attempt to draw lab, unsuccessful

## 2017-11-24 NOTE — Progress Notes (Signed)
TTS consulted with Donell Sievert, PA who recommends inpt treatment. Pt's nurse Lillia Abed, RN and EDP Palumbo, April, MD have been advised.  Pt tentatively accepted to [facility] 506-1 to Dr. Altamese Climax, MD after 12pm. Call to report [phone]. Afternoon AC to confirm time of transfer.  Princess Bruins, MSW, LCSW Therapeutic Triage Specialist  [phone]

## 2017-11-24 NOTE — ED Notes (Signed)
Bed: WBH35 Expected date:  Expected time:  Means of arrival:  Comments: 

## 2017-11-24 NOTE — ED Notes (Signed)
Pt discharged safely with [employer].  Pt was calm and cooperative.  All belongings were sent with patient. 

## 2017-11-24 NOTE — BH Assessment (Addendum)
Assessment Note  Chloe [name] is an 52 y.o. female who presents to the ED under IVC. Per IVC, respondent "is prescribed medication which she is not taking regularly. Has been making statements to her husband that she would kill him or herself, that one of them is going to die. Has hallucinations, hearing voices telling her to do things. This evening left her house and sat in her car for over 45 minutes then walked into her neighbors house. Left the residence and just walked away without telling anyone."   Pt appears manic during the assessment as she is speaking rapidly and in tangents. Pt describes an incident at a funeral in which a woman approached her and asked how her husband was doing. Pt continues to express paranoid ideations throughout the assessment. Pt states after the funeral she found out that she had a bounty on her life. Pt states she stayed home for 4 days because "someone put a bounty out on my head and they were trying to kill me." Pt states she believes her husband is the person who placed the bounty on her and she believes he is trying to kill her. Pt states when she left her home she saw multiple police cars and fire trucks which she believes were looking for her. Pt states she also found multiple e-mail accounts under her name on her husbands computer. Pt was asked if there are weapons in her home and she stated "I have not seen them but I don't know. I think my husband has some. He may try to use them." Pt admits she is not taking her medication and states she cannot recall the last time she took her prescription.   TTS consulted with Donell Sievert, PA who recommends inpt treatment. Pt's nurse Lillia Abed, RN and EDP Palumbo, April, MD have been advised.   Pt tentatively accepted to [facility] after 12pm. Afternoon AC to coordinate time of transfer.  Diagnosis: Bipolar disorder, current episode manic   Past Medical History:  Past Medical History:  Diagnosis Date  . Anxiety   . Bipolar  disorder (HCC)   . Breast mass, right   . GERD (gastroesophageal reflux disease)   . Hyperlipidemia   . Post-operative nausea and vomiting   . Smoker     Past Surgical History:  Procedure Laterality Date  . ABDOMINAL HYSTERECTOMY     had nausea and vomiting post-op  . BREAST LUMPECTOMY WITH RADIOACTIVE SEED LOCALIZATION Right 11/12/2016   Procedure: RIGHT BREAST LUMPECTOMY WITH RADIOACTIVE SEED LOCALIZATION;  Surgeon: Harriette Bouillon, MD;  Location: [facility];  Service: General;  Laterality: Right;  . CHOLECYSTECTOMY    . LAPAROSCOPIC LYSIS OF ADHESIONS    . MYOMECTOMY    . PLANTAR FASCIA SURGERY Left     Family History:  Family History  Problem Relation Age of Onset  . Lung cancer Mother   . Hyperlipidemia Brother   . Colon cancer Neg Hx   . Esophageal cancer Neg Hx   . Stomach cancer Neg Hx     Social History:  reports that she has been smoking cigarettes. She has been smoking about 0.50 packs per day. She has never used smokeless tobacco. She reports that she drank alcohol. She reports that she does not use drugs.  Additional Social History:  Alcohol / Drug Use Pain Medications: See MAR Prescriptions: See MAR Over the Counter: See MAR History of alcohol / drug use?: No history of alcohol / drug abuse  CIWA: CIWA-Ar BP: Marland Kitchen)  153/99 Pulse Rate: 73 COWS:    Allergies:  Allergies  Allergen Reactions  . Penicillins Hives    Has patient had a PCN reaction causing immediate rash, facial/tongue/throat swelling, SOB or lightheadedness with hypotension: Yes Has patient had a PCN reaction causing severe rash involving mucus membranes or skin necrosis: No Has patient had a PCN reaction that required hospitalization: No Has patient had a PCN reaction occurring within the last 10 years: Yes If all of the above answers are "NO", then may proceed with Cephalosporin use.    Home Medications:  (Not in a hospital admission)  OB/GYN Status:  No LMP recorded.  Patient has had a hysterectomy.  General Assessment Data Location of Assessment: [facility] ED TTS Assessment: In system Is this a Tele or Face-to-Face Assessment?: Face-to-Face Is this an Initial Assessment or a Re-assessment for this encounter?: Initial Assessment Patient Accompanied by:: (alone) Language Other than English: No What gender do you identify as?: Female Marital status: Married Pregnancy Status: No Living Arrangements: Spouse/significant other Can pt return to current living arrangement?: Yes Admission Status: Involuntary Petitioner: Family member(husband) Is patient capable of signing voluntary admission?: No Referral Source: Self/Family/Friend Insurance type: BCBS     Crisis Care Plan Living Arrangements: Spouse/significant other Name of Psychiatrist: Dr. Donell Beers, MD Name of Therapist: Jerral Bonito  Education Status Is patient currently in school?: No Is the patient employed, unemployed or receiving disability?: Employed  Risk to self with the past 6 months Suicidal Ideation: Yes-Currently Present(per IVC) Has patient been a risk to self within the past 6 months prior to admission? : No Suicidal Intent: No Has patient had any suicidal intent within the past 6 months prior to admission? : No Is patient at risk for suicide?: Yes Suicidal Plan?: No Has patient had any suicidal plan within the past 6 months prior to admission? : No What has been your use of drugs/alcohol within the last 12 months?: denies Previous Attempts/Gestures: No Triggers for Past Attempts: None known Intentional Self Injurious Behavior: None Family Suicide History: No Recent stressful life event(s): Other (Comment)(not taking Bipolar medication per IVC ) Persecutory voices/beliefs?: No Depression: No Substance abuse history and/or treatment for substance abuse?: No Suicide prevention information given to non-admitted patients: Not applicable  Risk to Others within the past 6  months Homicidal Ideation: Yes-Currently Present(per IVC ) Does patient have any lifetime risk of violence toward others beyond the six months prior to admission? : Yes (comment)(IVC states pt made threats to kill husband ) Thoughts of Harm to Others: No Current Homicidal Intent: No Current Homicidal Plan: No Access to Homicidal Means: No History of harm to others?: No Assessment of Violence: None Noted Does patient have access to weapons?: Yes (Comment)(pt states there may be guns in the home ) Criminal Charges Pending?: No Does patient have a court date: No Is patient on probation?: No  Psychosis Hallucinations: Auditory, Visual Delusions: Unspecified  Mental Status Report Appearance/Hygiene: Unremarkable Eye Contact: Good Motor Activity: Freedom of movement Speech: Rapid, Pressured, Tangential Level of Consciousness: Alert, Restless Mood: Anxious, Labile Affect: Anxious, Preoccupied Anxiety Level: Severe Thought Processes: Flight of Ideas, Tangential Judgement: Impaired Orientation: Person, Place, Time Obsessive Compulsive Thoughts/Behaviors: None  Cognitive Functioning Concentration: Fair Memory: Remote Intact, Recent Intact Is patient IDD: No Insight: Poor Impulse Control: Poor Appetite: Good Have you had any weight changes? : No Change Sleep: Decreased Total Hours of Sleep: 5 Vegetative Symptoms: None  ADLScreening [facility] Assessment Services) Patient's cognitive ability adequate to safely complete daily  activities?: Yes Patient able to express need for assistance with ADLs?: Yes Independently performs ADLs?: Yes (appropriate for developmental age)  Prior Inpatient Therapy Prior Inpatient Therapy: Yes Prior Therapy Dates: 2009, 2005, 2004 Prior Therapy Facilty/Provider(s): [facility] Reason for Treatment: BIPOLAR  Prior Outpatient Therapy Prior Outpatient Therapy: Yes Prior Therapy Dates: CURRENT Prior Therapy Facilty/Provider(s): [facility], DR. Donell Beers,  MD Reason for Treatment: BIPOLAR, MED MANAGEMENT  Does patient have an ACCT team?: No Does patient have Intensive In-House Services?  : No Does patient have [facility] services? : No Does patient have P4CC services?: No  ADL Screening (condition at time of admission) Patient's cognitive ability adequate to safely complete daily activities?: Yes Is the patient deaf or have difficulty hearing?: No Does the patient have difficulty seeing, even when wearing glasses/contacts?: No Does the patient have difficulty concentrating, remembering, or making decisions?: Yes Patient able to express need for assistance with ADLs?: Yes Does the patient have difficulty dressing or bathing?: No Independently performs ADLs?: Yes (appropriate for developmental age) Does the patient have difficulty walking or climbing stairs?: No Weakness of Legs: None Weakness of Arms/Hands: None  Home Assistive Devices/Equipment Home Assistive Devices/Equipment: Eyeglasses    Abuse/Neglect Assessment (Assessment to be complete while patient is alone) Abuse/Neglect Assessment Can Be Completed: Yes Physical Abuse: Denies Verbal Abuse: Denies Sexual Abuse: Denies Exploitation of patient/patient's resources: Denies Self-Neglect: Denies     Merchant navy officer (For Healthcare) Does Patient Have a Medical Advance Directive?: No Would patient like information on creating a medical advance directive?: No - Patient declined          Disposition: TTS consulted with Donell Sievert, PA who recommends inpt treatment.   Disposition Initial Assessment Completed for this Encounter: Yes Disposition of Patient: Admit(PER SPENCER SIMON, PA) Type of inpatient treatment program: Adult Patient refused recommended treatment: No  On Site Evaluation by:   Reviewed with Physician:    Karolee Ohs 11/24/2017 5:47 AM

## 2017-11-24 NOTE — ED Notes (Addendum)
Pt belongings placed in cabinet labeled "9-12" 

## 2017-11-24 NOTE — ED Notes (Signed)
Pt oriented to room and unit.  Pt is pleasant and cooperative.  Pt denies S/I and H/I at this time .  She denies AVH.  15 minute checks and video monitoring in place.

## 2017-11-25 DIAGNOSIS — F312 Bipolar disorder, current episode manic severe with psychotic features: Principal | ICD-10-CM

## 2017-11-25 MED ORDER — ARIPIPRAZOLE 15 MG PO TABS
15.0000 mg | ORAL_TABLET | Freq: Every day | ORAL | Status: DC
  Administered 2017-11-25 – 2017-11-28 (×4): 15 mg via ORAL
  Filled 2017-11-25 (×6): qty 1

## 2017-11-25 MED ORDER — BUPROPION HCL ER (XL) 150 MG PO TB24
150.0000 mg | ORAL_TABLET | Freq: Every day | ORAL | Status: DC
  Administered 2017-11-26 – 2017-11-28 (×3): 150 mg via ORAL
  Filled 2017-11-25 (×5): qty 1

## 2017-11-25 NOTE — Tx Team (Signed)
Initial Treatment Plan 11/25/2017 12:38 AM [name] WUJ:[id number]    PATIENT STRESSORS: Medication change or noncompliance Other: Chonic mental illness   PATIENT STRENGTHS: General fund of knowledge Physical Health Supportive family/friends   PATIENT IDENTIFIED PROBLEMS: Psychosis/paranoia    "Get back on meds"                 DISCHARGE CRITERIA:  Improved stabilization in mood, thinking, and/or behavior Verbal commitment to aftercare and medication compliance  PRELIMINARY DISCHARGE PLAN: Outpatient therapy Medication management  PATIENT/FAMILY INVOLVEMENT: This treatment plan has been presented to and reviewed with the patient, Chloe [name].  The patient and family have been given the opportunity to ask questions and make suggestions.  Levin Bacon, RN 11/25/2017, 12:38 AM

## 2017-11-25 NOTE — BHH Group Notes (Signed)
LCSW Group Therapy 11/25/2017 1:15pm  Type of Therapy and Topic:  Group Therapy:  Change and Accountability  Participation Level:  Minimal  Description of Group In this group, patients discussed power and accountability for change.  The group identified the challenges related to accountability and the difficulty of accepting the outcomes of negative behaviors.  Patients were encouraged to openly discuss a challenge/change they could take responsibility for.  Patients discussed the use of "change talk" and positive thinking as ways to support achievement of personal goals.  The group discussed ways to give support and empowerment to peers.  Therapeutic Goals: 1. Patients will state the relationship between personal power and accountability in the change process 2. Patients will identify the positive and negative consequences of a personal choice they have made 3. Patients will identify one challenge/choice they will take responsibility for making 4. Patients will discuss the role of "change talk" and the impact of positive thinking as it supports successful personal change 5. Patients will verbalize support and affirmation of change efforts in peers  Summary of Patient Progress:  Stayed the entire time, disorganized.  "My thoughts are kind of all over the place.  I have people I can trust, my Dr and my therapist."  Therapeutic Modalities Solution Focused Brief Therapy Motivational Interviewing Cognitive Behavioral Therapy  Chloe [name], [place] 11/25/2017 12:13 PM

## 2017-11-25 NOTE — Progress Notes (Signed)
Recreation Therapy Notes  INPATIENT RECREATION THERAPY ASSESSMENT  Patient Details Name: CARLETHA DAWN MRN: [id number] DOB: 12/28/1965 Today's Date: 11/25/2017       Information Obtained From: Patient  Able to Participate in Assessment/Interview: Yes  Patient Presentation: Alert  Reason for Admission (Per Patient): Other (Comments)("a whirlwind")  Patient Stressors: Family  Coping Skills:   TV, Music, Exercise, Meditate, Deep Breathing, Talk, Prayer, Other (Comment), Read(Make jewelry)  Leisure Interests (2+):  Sports - Swimming, Crafts - Other (Comment)(Make jewelry)  Frequency of Recreation/Participation: Weekly  Awareness of Community Resources:  Yes  Community Resources:  Library, Other (Comment)(Stores)  Current Use: Yes  If no, Barriers?:    Expressed Interest in [employer] Information: No  [place]idence[place]  Patient Main Form of Transportation: Set designer  Patient Strengths:  Love to clean; organized/detailed, Honest  Patient Identified Areas of Improvement:     Patient Goal for Hospitalization:  "Sleep"  Current SI (including self-harm):  No  Current HI:  No  Current AVH: No  Staff Intervention Plan: Group Attendance, Collaborate with Interdisciplinary Treatment Team  Consent to Intern Participation: N/A    Caroll Rancher, LRT/CTRS  Lillia Abed, Viva Gallaher A 11/25/2017, 1:45 PM

## 2017-11-25 NOTE — [facility] Suicide Risk Assessment (Signed)
[facility] Admission Suicide Risk Assessment   Nursing information obtained from:  Patient Demographic factors:  Caucasian Current Mental Status:  NA Loss Factors:  NA Historical Factors:  NA Risk Reduction Factors:  Living with another person, especially a relative  Total Time spent with patient: 30 minutes Principal Problem: Bipolar I disorder, current or most recent episode manic, with psychotic features (HCC) Diagnosis:   Patient Active Problem List   Diagnosis Date Noted  . Bipolar affective disorder, depressed, severe, with psychotic behavior (HCC) [F31.5] 11/24/2017  . Bipolar I disorder, current or most recent episode manic, with psychotic features (HCC) [F31.2] 11/24/2017  . Acute sciatica [M54.30] 07/18/2017  . Backache with radiation [M54.9] 07/18/2017   Subjective Data: see H&P  Continued Clinical Symptoms:  Alcohol Use Disorder Identification Test Final Score (AUDIT): 0 The "Alcohol Use Disorders Identification Test", Guidelines for Use in Primary Care, Second Edition.  World Science writer [facility]). Score between 0-7:  no or low risk or alcohol related problems. Score between 8-15:  moderate risk of alcohol related problems. Score between 16-19:  high risk of alcohol related problems. Score 20 or above:  warrants further diagnostic evaluation for alcohol dependence and treatment.   Psychiatric Specialty Exam:     Blood pressure 121/79, pulse 81, temperature 98.1 F (36.7 C), temperature source Oral, resp. rate 18, height 5' 0.5" (1.537 m), weight 68.5 kg, SpO2 100 %.Body mass index is 29 kg/m.     COGNITIVE FEATURES THAT CONTRIBUTE TO RISK:  None    SUICIDE RISK:   Minimal: No identifiable suicidal ideation.  Patients presenting with no risk factors but with morbid ruminations; may be classified as minimal risk based on the severity of the depressive symptoms  PLAN OF CARE: see H&P  I certify that inpatient services furnished can reasonably be expected to  improve the patient's condition.   Micheal Likens, MD 11/25/2017, 4:31 PM

## 2017-11-25 NOTE — Progress Notes (Signed)
Recreation Therapy Notes  Date: 10.8.19 Time: 1000 Location: 500 Hall Dayroom  Group Topic: Leisure Education, Goal Setting  Goal Area(s) Addresses:  Patient will be able to identify at least 3 life goals.  Patient will be able to identify benefit of investing in life goals.  Patient will be able to identify benefit of setting life goals.   Intervention: Worksheet  Activity: Setting Life Goals.  Patients were to identify that they were doing well, where they needed to make improvements and set a goal to make those improvements in the categories of family, friends, work/school, spirituality, body and mental health.  Education:  Discharge Planning, Pharmacologist, Leisure Education   Education Outcome: Acknowledges Education/In Group Clarification Provided/Needs Additional Education  Clinical Observations: Pt did not attend group.    Caroll Rancher, LRT/CTRS         Caroll Rancher A 11/25/2017 11:42 AM
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
## 2017-11-26 NOTE — [facility] Group Notes (Signed)
LCSW Group Therapy Note  11/26/2017 1:15pm  Type of Therapy/Topic:  Group Therapy:  Emotion Regulation  Participation Level:  Did Not Attend     Ida Rogue, LCSW 11/26/2017 10:23 AM

## 2017-11-26 NOTE — Progress Notes (Signed)
[facility] MD Progress Note  11/26/2017 12:51 PM Chloe [name]  MRN:  [id number] Subjective:    Chloe [name] is a 52 y/o F with history of bipolar disorder who was admitted on IVC from [facility] where she presented brought in by police with worsening symptoms of AH, paranoia, delusions, disorganized behavior, distractibility, flight of ideas, and decreased need for sleep. She was medically cleared and then transferred to [facility] for additional treatment and stabilization. She was restarted on home medications of lamictal and wellbutrin (at lowered dose). She was also restarted on previous medication of tegretol in the ED. Upon arrival at [facility] she was started on trial of abilify to address symptoms of psychosis. She has been reporting improvement of her presenting symptoms.  Today upon evaluation, pt shares, "I'm okay - just feeling a little tired." Pt describes feeling slighltly fatigued which she associates with her medications, and we discussed that she may feel effects of her medication in terms of slowing her symptoms of mania. Pt agreed she felt like symptoms of racing thoughts/flight of ideas was improving, and this was likely related to her medications. She denies any specific concerns. She is sleeping well. Her appetite is good. She denies other physical complaints. She denies SI/HI/AH/VH. She is tolerating her medications well, and she is in agreement to continue her current regimen without changes. She had no further questions, comments, or concerns.   Principal Problem: Bipolar I disorder, current or most recent episode manic, with psychotic features (HCC) Diagnosis:   Patient Active Problem List   Diagnosis Date Noted  . Bipolar affective disorder, depressed, severe, with psychotic behavior (HCC) [F31.5] 11/24/2017  . Bipolar I disorder, current or most recent episode manic, with psychotic features (HCC) [F31.2] 11/24/2017  . Acute sciatica [M54.30] 07/18/2017  . Backache with radiation [M54.9]  07/18/2017   Total Time spent with patient: 30 minutes  Past Psychiatric History: see H&P  Past Medical History:  Past Medical History:  Diagnosis Date  . Anxiety   . Bipolar disorder (HCC)   . Breast mass, right   . GERD (gastroesophageal reflux disease)   . Hyperlipidemia   . Post-operative nausea and vomiting   . Smoker     Past Surgical History:  Procedure Laterality Date  . ABDOMINAL HYSTERECTOMY     had nausea and vomiting post-op  . BREAST LUMPECTOMY WITH RADIOACTIVE SEED LOCALIZATION Right 11/12/2016   Procedure: RIGHT BREAST LUMPECTOMY WITH RADIOACTIVE SEED LOCALIZATION;  Surgeon: Harriette Bouillon, MD;  Location:  SURGERY CENTER;  Service: General;  Laterality: Right;  . CHOLECYSTECTOMY    . LAPAROSCOPIC LYSIS OF ADHESIONS    . MYOMECTOMY    . PLANTAR FASCIA SURGERY Left    Family History:  Family History  Problem Relation Age of Onset  . Lung cancer Mother   . Hyperlipidemia Brother   . Colon cancer Neg Hx   . Esophageal cancer Neg Hx   . Stomach cancer Neg Hx    Family Psychiatric  History: see H&P Social History:  Social History   Substance and Sexual Activity  Alcohol Use Not Currently   Comment: social     Social History   Substance and Sexual Activity  Drug Use No    Social History   Socioeconomic History  . Marital status: Married    Spouse name: Not on file  . Number of children: Not on file  . Years of education: Not on file  . Highest education level: Not on file  Occupational History  .  Not on file  Social Needs  . Financial resource strain: Not on file  . Food insecurity:    Worry: Not on file    Inability: Not on file  . Transportation needs:    Medical: Not on file    Non-medical: Not on file  Tobacco Use  . Smoking status: Current Every Day Smoker    Packs/day: 0.50    Types: Cigarettes  . Smokeless tobacco: Never Used  Substance and Sexual Activity  . Alcohol use: Not Currently    Comment: social  . Drug use:  No  . Sexual activity: Not on file  Lifestyle  . Physical activity:    Days per week: Not on file    Minutes per session: Not on file  . Stress: Not on file  Relationships  . Social connections:    Talks on phone: Not on file    Gets together: Not on file    Attends religious service: Not on file    Active member of club or organization: Not on file    Attends meetings of clubs or organizations: Not on file    Relationship status: Not on file  Other Topics Concern  . Not on file  Social History Narrative  . Not on file   Additional Social History:                         Sleep: Good  Appetite:  Good  Current Medications: Current Facility-Administered Medications  Medication Dose Route Frequency Provider Last Rate Last Dose  . acetaminophen (TYLENOL) tablet 650 mg  650 mg Oral Q4H PRN Charm Rings, NP      . alum & mag hydroxide-simeth (MAALOX/MYLANTA) 200-200-20 MG/5ML suspension 30 mL  30 mL Oral Q6H PRN Charm Rings, NP      . ARIPiprazole (ABILIFY) tablet 15 mg  15 mg Oral Daily Micheal Likens, MD   15 mg at 11/26/17 0826  . buPROPion (WELLBUTRIN XL) 24 hr tablet 150 mg  150 mg Oral Daily Micheal Likens, MD   150 mg at 11/26/17 1610  . carbamazepine (TEGRETOL) tablet 200 mg  200 mg Oral BID Charm Rings, NP   200 mg at 11/26/17 0825  . hydrOXYzine (ATARAX/VISTARIL) tablet 25 mg  25 mg Oral TID PRN Jackelyn Poling, NP   25 mg at 11/25/17 0640  . Influenza vac split quadrivalent PF (FLUARIX) injection 0.5 mL  0.5 mL Intramuscular Tomorrow-1000 Jolyne Loa T, MD      . lamoTRIgine (LAMICTAL) tablet 200 mg  200 mg Oral Daily Charm Rings, NP   200 mg at 11/26/17 0826  . magnesium hydroxide (MILK OF MAGNESIA) suspension 30 mL  30 mL Oral Daily PRN Charm Rings, NP      . nicotine (NICODERM CQ - dosed in mg/24 hours) patch 21 mg  21 mg Transdermal Daily Charm Rings, NP   21 mg at 11/26/17 0826  . ondansetron (ZOFRAN)  tablet 4 mg  4 mg Oral Q8H PRN Charm Rings, NP      . pantoprazole (PROTONIX) EC tablet 40 mg  40 mg Oral Daily Charm Rings, NP   40 mg at 11/26/17 0826  . pneumococcal 23 valent vaccine (PNU-IMMUNE) injection 0.5 mL  0.5 mL Intramuscular Tomorrow-1000 Jolyne Loa T, MD      . pravastatin (PRAVACHOL) tablet 20 mg  20 mg Oral Daily Charm Rings, NP   20  mg at 11/26/17 0826  . traZODone (DESYREL) tablet 50 mg  50 mg Oral QHS,MR X 1 Nira Conn A, NP   50 mg at 11/25/17 2119    Lab Results: No results found for this or any previous visit (from the past 48 hour(s)).  Blood Alcohol level:  Lab Results  Component Value Date   ETH <10 11/24/2017   [facility]  01/18/2008    <5        LOWEST DETECTABLE LIMIT FOR SERUM ALCOHOL IS 11 mg/dL FOR MEDICAL PURPOSES ONLY    Metabolic Disorder Labs: No results found for: HGBA1C, MPG No results found for: PROLACTIN No results found for: CHOL, TRIG, HDL, CHOLHDL, VLDL, LDLCALC  Physical Findings: AIMS:  , ,  ,  ,    CIWA:    COWS:     Musculoskeletal: Strength & Muscle Tone: within normal limits Gait & Station: normal Patient leans: N/A  Psychiatric Specialty Exam: Physical Exam  Nursing note and vitals reviewed.   Review of Systems  Constitutional: Negative for chills and fever.  Respiratory: Negative for cough and shortness of breath.   Cardiovascular: Negative for chest pain.  Gastrointestinal: Negative for abdominal pain, heartburn, nausea and vomiting.  Psychiatric/Behavioral: Negative for depression, hallucinations and suicidal ideas. The patient is not nervous/anxious and does not have insomnia.     Blood pressure (!) 120/108, pulse 74, temperature 98.1 F (36.7 C), temperature source Oral, resp. rate 18, height 5' 0.5" (1.537 m), weight 68.5 kg, SpO2 100 %.Body mass index is 29 kg/m.  General Appearance: Casual and Fairly Groomed  Eye Contact:  Good  Speech:  Clear and Coherent and Normal Rate  Volume:  Normal   Mood:  Anxious and Euthymic  Affect:  Appropriate, Congruent and Constricted  Thought Process:  Coherent and Goal Directed  Orientation:  Full (Time, Place, and Person)  Thought Content:  Logical  Suicidal Thoughts:  No  Homicidal Thoughts:  No  Memory:  Immediate;   Fair Recent;   Fair Remote;   Fair  Judgement:  Fair  Insight:  Fair  Psychomotor Activity:  Normal  Concentration:  Concentration: Fair  Recall:  [employer] of Knowledge:  Fair  Language:  Fair  Akathisia:  No  Handed:    AIMS (if indicated):     Assets:  Resilience Social Support  ADL's:  Intact  Cognition:  WNL  Sleep:  Number of Hours: 4.5   Treatment Plan Summary: Daily contact with patient to assess and evaluate symptoms and progress in treatment and Medication management   -Continue inpatient hospitalization  -Bipolar I, current episode manic with psychotic features   -Continue abilify 15mg  po qDay   -Continue wellbutrin XL 150mg  po qDay   -Continue tegretol 200mg  po BID   -Continue lamictal 200mg  po qDay  -anxiety  -continue vistaril 25mg  po TID prn anxiety  -GERD   -Continue protonix 40mg  po qDay  -HLD   -Continue pravastatin 20mg  po qDay  -insomnia   -Continue trazodone 50mg  po qhs prn insomnia (may repeat x1 prn insomnia)  -Encourage participation in groups and therapeutic milieu  -disposition planning will be ongoing  Micheal Likens, MD 11/26/2017, 12:51 PM

## 2017-11-26 NOTE — Progress Notes (Signed)
Nursing Progress Note: 7p-7a D: Pt currently presents with a anxious/depressed/avoidant affect and behavior. Pt states "I just stayed in my room tonight." Not interacting with the milieu. Pt reports good sleep during the previous night with current medication regimen. Pt did not attend wrap-up group.  A: Pt provided with medications per providers orders. Pt's labs and vitals were monitored throughout the night. Pt supported emotionally and encouraged to express concerns and questions. Pt educated on medications.  R: Pt's safety ensured with 15 minute and environmental checks. Pt currently denies SI, HI, and AVH. Pt verbally contracts to seek staff if SI,HI, or AVH occurs and to consult with staff before acting on any harmful thoughts. Will continue to monitor.

## 2017-11-26 NOTE — [facility] Suicide Risk Assessment (Signed)
[facility] INPATIENT:  Family/Significant Other Suicide Prevention Education  Suicide Prevention Education:  Education Completed; Aviva Signs, husband, [phone]  has been identified by the patient as the family member/significant other with whom the patient will be residing, and identified as the person(s) who will aid the patient in the event of a mental health crisis (suicidal ideations/suicide attempt).  With written consent from the patient, the family member/significant other has been provided the following suicide prevention education, prior to the and/or following the discharge of the patient.  The suicide prevention education provided includes the following:  Suicide risk factors  Suicide prevention and interventions  National Suicide Hotline telephone number  [facility] assessment telephone number  [facility] Emergency Assistance 911  [facility] and/or Residential Mobile Crisis Unit telephone number  Request made of family/significant other to:  Remove weapons (e.g., guns, rifles, knives), all items previously/currently identified as safety concern.    Remove drugs/medications (over-the-counter, prescriptions, illicit drugs), all items previously/currently identified as a safety concern.  The family member/significant other verbalizes understanding of the suicide prevention education information provided.  The family member/significant other agrees to remove the items of safety concern listed above. Confirmed that there was a BB gun in the home and another gun with no ammunition.  Stated he would put them away whre she cannot get to them. Baldo Daub [facility] 11/26/2017, 2:00 PM

## 2017-11-26 NOTE — Progress Notes (Signed)
Psychoeducational Group Note  Date:  11/26/2017 Time:  2112  Group Topic/Focus:  Wrap-Up Group:   The focus of this group is to help patients review their daily goal of treatment and discuss progress on daily workbooks.  Participation Level: Did Not Attend  Participation Quality:  Not Applicable  Affect:  Not Applicable  Cognitive:  Not Applicable  Insight:  Not Applicable  Engagement in Group: Not Applicable  Additional Comments:  The patient did not attend group.   Kayman Snuffer S 11/26/2017, 9:12 PM

## 2017-11-26 NOTE — Progress Notes (Signed)
Patient denies SI, HI and AVH.  Patient has had reduced paranoid behaviors and is satisfied with her medication adjustment.  Patient has had minor complaints about dizziness but otherwise states she is toleration things well.   Assess patient for safety, offer medications as planned engage patient in 1:1 staff talks.   Continue to monitor as planned.  Patient able to contract for safety.

## 2017-11-26 NOTE — Progress Notes (Signed)
Recreation Therapy Notes  Date: 10.9.19 Time: 1000 Location: 500 Hall Dayroom  Group Topic: Self-Esteem  Goal Area(s) Addresses:  Patient will successfully identify positive attributes about themselves.  Patient will successfully identify benefit of improved self-esteem.   Behavioral Response: Engaged  Intervention: Scientist, clinical (histocompatibility and immunogenetics), scissors, glue sticks, magazines, music  Activity: Collage.  Patients were to use the supplies provided to create a collage that highlighted the good things about them.  Patients could also focus on accomplishments or places they want go or activities they want to try in the future.  Education:  Self-Esteem, Building control surveyor.   Education Outcome: Acknowledges education/In group clarification offered/Needs additional education  Clinical Observations/Feedback: Pt had a picture of a dog and dog treats as a reminder of her pets and stated she wants to make dog treats.  Pt also highlighted a picture of a candle and cinnamon because "it reminds me of my mother in law", cheesecake/fancy dinners, a mini umbrella that reminds of a [place], wedding/evening gowns because she likes the way they look.    Caroll Rancher, LRT/CTRS       Caroll Rancher A 11/26/2017 11:52 AM

## 2017-11-26 NOTE — Tx Team (Signed)
Interdisciplinary Treatment and Diagnostic Plan Update  11/26/2017 Time of Session: 9:05 AM  Chloe [name] MRN: [id number]  Principal Diagnosis: Bipolar I disorder, current or most recent episode manic, with psychotic features ([place])  Secondary Diagnoses: Principal Problem:   Bipolar I disorder, current or most recent episode manic, with psychotic features ([place])   Current Medications:  Current Facility-Administered Medications  Medication Dose Route Frequency Provider Last Rate Last Dose  . acetaminophen (TYLENOL) tablet 650 mg  650 mg Oral Q4H PRN Patrecia Pour, NP      . alum & mag hydroxide-simeth (MAALOX/MYLANTA) 200-200-20 MG/5ML suspension 30 mL  30 mL Oral Q6H PRN Patrecia Pour, NP      . ARIPiprazole (ABILIFY) tablet 15 mg  15 mg Oral Daily Pennelope Bracken, MD   15 mg at 11/26/17 0826  . buPROPion (WELLBUTRIN XL) 24 hr tablet 150 mg  150 mg Oral Daily Pennelope Bracken, MD   150 mg at 11/26/17 8242  . carbamazepine (TEGRETOL) tablet 200 mg  200 mg Oral BID Patrecia Pour, NP   200 mg at 11/26/17 0825  . hydrOXYzine (ATARAX/VISTARIL) tablet 25 mg  25 mg Oral TID PRN Rozetta Nunnery, NP   25 mg at 11/25/17 0640  . Influenza vac split quadrivalent PF (FLUARIX) injection 0.5 mL  0.5 mL Intramuscular Tomorrow-1000 Maris Berger T, MD      . lamoTRIgine (LAMICTAL) tablet 200 mg  200 mg Oral Daily Patrecia Pour, NP   200 mg at 11/26/17 0826  . magnesium hydroxide (MILK OF MAGNESIA) suspension 30 mL  30 mL Oral Daily PRN Patrecia Pour, NP      . nicotine (NICODERM CQ - dosed in mg/24 hours) patch 21 mg  21 mg Transdermal Daily Patrecia Pour, NP   21 mg at 11/26/17 0826  . ondansetron (ZOFRAN) tablet 4 mg  4 mg Oral Q8H PRN Patrecia Pour, NP      . pantoprazole (PROTONIX) EC tablet 40 mg  40 mg Oral Daily Patrecia Pour, NP   40 mg at 11/26/17 0826  . pneumococcal 23 valent vaccine (PNU-IMMUNE) injection 0.5 mL  0.5 mL Intramuscular Tomorrow-1000  Maris Berger T, MD      . pravastatin (PRAVACHOL) tablet 20 mg  20 mg Oral Daily Patrecia Pour, NP   20 mg at 11/26/17 0826  . traZODone (DESYREL) tablet 50 mg  50 mg Oral QHS,MR X 1 Lindon Romp A, NP   50 mg at 11/25/17 2119    PTA Medications: Medications Prior to Admission  Medication Sig Dispense Refill Last Dose  . acetaminophen (TYLENOL) 500 MG tablet Take 1,000 mg by mouth every 6 (six) hours as needed for mild pain.   11/23/2017 at Unknown time  . albuterol (PROVENTIL HFA;VENTOLIN HFA) 108 (90 Base) MCG/ACT inhaler Inhale 1-2 puffs into the lungs every 6 (six) hours as needed for wheezing or shortness of breath.    Past Month at Unknown time  . ALPRAZolam (XANAX) 0.25 MG tablet Take 0.0625 mg by mouth daily.   4 11/24/2017 at Unknown time  . buPROPion (WELLBUTRIN XL) 150 MG 24 hr tablet Take 300 mg by mouth daily.    11/23/2017 at Unknown time  . carbamazepine (TEGRETOL) 200 MG tablet Take 200 mg by mouth 2 (two) times daily.   Has not received it in mail yet  . estradiol (VIVELLE-DOT) 0.1 MG/24HR patch Place 0.1 mg onto the skin every 3 (three) days.  Past Week at Unknown time  . ibuprofen (ADVIL,MOTRIN) 200 MG tablet Take 800 mg by mouth every 6 (six) hours as needed for moderate pain.   Past Week at Unknown time  . lamoTRIgine (LAMICTAL) 200 MG tablet Take 200 mg by mouth daily.    11/23/2017 at Unknown time  . omeprazole (PRILOSEC) 20 MG capsule Take 20 mg by mouth daily.   11/23/2017 at Unknown time  . pravastatin (PRAVACHOL) 20 MG tablet Take 20 mg by mouth daily.   11/23/2017 at Unknown time    Patient Stressors: Medication change or noncompliance Other: [place] mental illness  Patient Strengths: General fund of knowledge Physical Health Supportive family/friends  Treatment Modalities: Medication Management, Group therapy, Case management,  1 to 1 session with clinician, Psychoeducation, Recreational therapy.   Physician Treatment Plan for Primary Diagnosis:  Bipolar I disorder, current or most recent episode manic, with psychotic features ([place]) Long Term Goal(s): Improvement in symptoms so as ready for discharge  Short Term Goals: Ability to identify and develop effective coping behaviors will improve Ability to maintain clinical measurements within normal limits will improve  Medication Management: Evaluate patient's response, side effects, and tolerance of medication regimen.  Therapeutic Interventions: 1 to 1 sessions, Unit Group sessions and Medication administration.  Evaluation of Outcomes: Progressing  Physician Treatment Plan for Secondary Diagnosis: Principal Problem:   Bipolar I disorder, current or most recent episode manic, with psychotic features ([place])   Long Term Goal(s): Improvement in symptoms so as ready for discharge  Short Term Goals: Ability to identify and develop effective coping behaviors will improve Ability to maintain clinical measurements within normal limits will improve  Medication Management: Evaluate patient's response, side effects, and tolerance of medication regimen.  Therapeutic Interventions: 1 to 1 sessions, Unit Group sessions and Medication administration.  Evaluation of Outcomes: Progressing   RN Treatment Plan for Primary Diagnosis: Bipolar I disorder, current or most recent episode manic, with psychotic features ([place]) Long Term Goal(s): Knowledge of disease and therapeutic regimen to maintain health will improve  Short Term Goals: Ability to identify and develop effective coping behaviors will improve and Compliance with prescribed medications will improve  Medication Management: RN will administer medications as ordered by provider, will assess and evaluate patient's response and provide education to patient for prescribed medication. RN will report any adverse and/or side effects to prescribing provider.  Therapeutic Interventions: 1 on 1 counseling sessions, Psychoeducation, Medication  administration, Evaluate responses to treatment, Monitor vital signs and CBGs as ordered, Perform/monitor CIWA, COWS, AIMS and Fall Risk screenings as ordered, Perform wound care treatments as ordered.  Evaluation of Outcomes: Progressing   LCSW Treatment Plan for Primary Diagnosis: Bipolar I disorder, current or most recent episode manic, with psychotic features ([place]) Long Term Goal(s): Safe transition to appropriate next level of care at discharge, Engage patient in therapeutic group addressing interpersonal concerns.  Short Term Goals: Engage patient in aftercare planning with referrals and resources  Therapeutic Interventions: Assess for all discharge needs, 1 to 1 time with Social worker, Explore available resources and support systems, Assess for adequacy in community support network, Educate family and significant other(s) on suicide prevention, Complete Psychosocial Assessment, Interpersonal group therapy.  Evaluation of Outcomes: Met Return home, follow up current providers   Progress in Treatment: Attending groups: Yes Participating in groups: Yes Taking medication as prescribed: Yes Toleration medication: Yes, no side effects reported at this time Family/Significant other contact made: Yes Patient understands diagnosis: No Limited insight Discussing patient identified problems/goals  with staff: Yes Medical problems stabilized or resolved: Yes Denies suicidal/homicidal ideation: Yes Issues/concerns per patient self-inventory: None Other: N/A  New problem(s) identified: None identified at this time.   New Short Term/Long Term Goal(s): "I want to be beautiful and fun.  I want to go home and take care of things."  Discharge Plan or Barriers:   Reason for Continuation of Hospitalization: Disorganization Mania Paranoia Medication stabilization   Estimated Length of Stay: 10/14  Attendees: Patient: Chloe [name] 11/26/2017  9:05 AM  Physician: Maris Berger,  MD 11/26/2017  9:05 AM  Nursing: Elesa Massed, RN 11/26/2017  9:05 AM  RN Care Manager: Lars Pinks, RN 11/26/2017  9:05 AM  Social Worker: Ripley Fraise 11/26/2017  9:05 AM  Recreational Therapist: Winfield Cunas 11/26/2017  9:05 AM  Other: Norberto Sorenson 11/26/2017  9:05 AM  Other:  11/26/2017  9:05 AM    Scribe for Treatment Team:  Roque Lias LCSW 11/26/2017 9:05 AM

## 2017-11-26 NOTE — Progress Notes (Signed)
D:  Miana mainly isolates to her room.  When she is in her room, she is either in the corner, behind door or in the bathroom with her lights off.   She did attend evening wrap up group.  She denied SI/HI but continues to hear noises unable to understand what they are saying.  While visiting with her husband this evening, she asked multiple questions about medications.  She reported feeling groggy today and blamed it on the Abilify which she was afraid was going to cause her to have amnesic episodes.  She mentioned that she was on Ambien in the past causing those same amnesic symptoms and she realized she was thinking Abilify was Ambien.  Reassured her that Abilify and Ambien are two different drugs.   She remains guarded and nervous about taking new medications.  She did take trazodone x 1 and is still having difficulty falling asleep but declined taking the repeat trazodone.  She denied any pain or discomfort and appeared to be in no physical distress.   A:  1:1 with RN for support and encouragement.  Medications as ordered.  Q 15 minute checks maintained for safety.  Encouraged participation in group and unit activities.   R:  [place] remains safe on the unit.  We will continue to monitor the progress towards her goals.Marland Kitchen

## 2017-11-26 NOTE — Plan of Care (Signed)
  Problem: Health Behavior/Discharge Planning: Goal: Compliance with prescribed medication regimen will improve Outcome: Progressing Note:  She is taking her medications but continues to be worried/cautious about new medications.

## 2017-11-27 NOTE — [facility] Group Notes (Signed)
LCSW Group Therapy Note  11/27/2017 1:15pm  Type of Therapy and Topic:  Group Therapy:  Feelings around Relapse and Recovery  Participation Level:  Minimal   Description of Group:    Patients in this group will discuss emotions they experience before and after a relapse. They will process how experiencing these feelings, or avoidance of experiencing them, relates to having a relapse. Facilitator will guide patients to explore emotions they have related to recovery. Patients will be encouraged to process which emotions are more powerful. They will be guided to discuss the emotional reaction significant others in their lives may have to their relapse or recovery. Patients will be assisted in exploring ways to respond to the emotions of others without this contributing to a relapse.  Therapeutic Goals: 1. Patient will identify two or more emotions that lead to a relapse for them 2. Patient will identify two emotions that result when they relapse 3. Patient will identify two emotions related to recovery 4. Patient will demonstrate ability to communicate their needs through discussion and/or role plays   Summary of Patient Progress:  Stayed the entire time, engaged throughout.  "I am making a major life change."  Refused to share what it is.  Vague, guarded, difficult time tracking the topic.   Therapeutic Modalities:   Cognitive Behavioral Therapy Solution-Focused Therapy Assertiveness Training Relapse Prevention Therapy   Ida Rogue, LCSW 11/27/2017 3:55 PM

## 2017-11-27 NOTE — Progress Notes (Signed)
Recreation Therapy Notes  Date: 10.10.19 Time: 1000 Location: 500 Hall Dayroom  Group Topic: Coping Skills  Goal Area(s) Addresses:  Patient will identify positive coping skills. Patient will identify benefits of coping skills.  Intervention: Worksheet  Activity: Pharmacologist.  Patients were given a worksheet to identify instances in which coping skills would be needed.  Patients were to then come up with coping skills to deal with that situation.  Education: Pharmacologist, Building control surveyor.   Education Outcome: Acknowledges understanding/In group clarification offered/Needs additional education.   Clinical Observations/Feedback: Pt did not attend group.    Caroll Rancher, LRT/CTRS         Caroll Rancher A 11/27/2017 10:52 AM

## 2017-11-27 NOTE — Plan of Care (Signed)
  Problem: Activity: Goal: Interest or engagement in activities will improve Outcome: Not Progressing   Problem: Safety: Goal: Periods of time without injury will increase Outcome: Progressing  DAR NOTE: Patient presents with flat affect and depressed mood.  Denies suicidal thoughts, auditory and visual hallucinations.  Described energy level as low and concentration as poor.  Rates depression at 3, hopelessness at 1, and anxiety at 2.  Maintained on routine safety checks.  Medications given as prescribed.  Support and encouragement offered as needed.  Attended group and participated.  States goal for today is "getting on the right medication and going home."  Patient remained in her room for most of this shift.

## 2017-11-27 NOTE — Progress Notes (Signed)
Nursing Progress Note: 7p-7a D: Pt currently presents with a anxious/depressed/paranoia affect and behavior. Pt states "I think the shot is giving me an allergic reaction." Interacting appropriately with the milieu. Pt reports good sleep during the previous night with current medication regimen. Pt did not attend wrap-up group.  A: Pt skin around injection site proves consistent with generalized skin pigmentation. No sign of allergic reaction. Pt provided with medications per providers orders. Pt's labs and vitals were monitored throughout the night. Pt supported emotionally and encouraged to express concerns and questions. Pt educated on medications.  R: Pt's safety ensured with 15 minute and environmental checks. Pt currently denies SI, HI, and VH and endorses AH. Pt verbally contracts to seek staff if SI,HI, or AVH occurs and to consult with staff before acting on any harmful thoughts. Will continue to monitor.

## 2017-11-27 NOTE — Progress Notes (Signed)
[facility] MD Progress Note  11/27/2017 1:52 PM Chloe [name]  MRN:  [id number] Subjective:    Chloe [name] is a 52 y/o F with history of bipolar disorder who was admitted on IVC from [facility] where she presented brought in by police with worsening symptoms of AH, paranoia, delusions, disorganized behavior, distractibility, flight of ideas, and decreased need for sleep. She was medically cleared and then transferred to [facility] for additional treatment and stabilization. She was restarted on home medications of lamictal and wellbutrin (at lowered dose). She was also restarted on previous medication of tegretol in the ED. Upon arrival at [facility] she was started on trial of abilify to address symptoms of psychosis. She has been reporting improvement of her presenting symptoms.  Today upon evaluation, pt shares, "I'm doing okay. I'm worried about what's going to happen when I leave." Pt goes on to detail ongoing paranoia/delusion that her husband has placed a bounty on her and he has been working with pt's sister-in-law to coordinate against the patient and keep her in the hospital and control her care. Pt provides example that she believes her sister-in-law has been calling her outpatient provider and impersonating the patient with hopes of manipulating her doctor and changing her medications. Pt is unsure what the motivation would be for this, but she explains that she has suspicion that her husband has been using illicit drugs such as methamphetamine. Discussed with patient that some of her concerns may be based in reality but possibly have been distorted or amplified due to recent worsened symptoms of bipolar disorder, and pt acknowledged that could be the case, saying, "I just don't want to make assumptions." Pt denies physical complaints today. She denies any specific concerns. She is sleeping well. Her appetite is good. She denies other physical complaints. She denies SI/HI/AH/VH. She is tolerating her medications  well, and she is in agreement to continue her current regimen without changes. She had no further questions, comments, or concerns.   Principal Problem: Bipolar I disorder, current or most recent episode manic, with psychotic features (HCC) Diagnosis:   Patient Active Problem List   Diagnosis Date Noted  . Bipolar affective disorder, depressed, severe, with psychotic behavior (HCC) [F31.5] 11/24/2017  . Bipolar I disorder, current or most recent episode manic, with psychotic features (HCC) [F31.2] 11/24/2017  . Acute sciatica [M54.30] 07/18/2017  . Backache with radiation [M54.9] 07/18/2017   Total Time spent with patient: 30 minutes  Past Psychiatric History: see H&P  Past Medical History:  Past Medical History:  Diagnosis Date  . Anxiety   . Bipolar disorder (HCC)   . Breast mass, right   . GERD (gastroesophageal reflux disease)   . Hyperlipidemia   . Post-operative nausea and vomiting   . Smoker     Past Surgical History:  Procedure Laterality Date  . ABDOMINAL HYSTERECTOMY     had nausea and vomiting post-op  . BREAST LUMPECTOMY WITH RADIOACTIVE SEED LOCALIZATION Right 11/12/2016   Procedure: RIGHT BREAST LUMPECTOMY WITH RADIOACTIVE SEED LOCALIZATION;  Surgeon: Harriette Bouillon, MD;  Location: [facility];  Service: General;  Laterality: Right;  . CHOLECYSTECTOMY    . LAPAROSCOPIC LYSIS OF ADHESIONS    . MYOMECTOMY    . PLANTAR FASCIA SURGERY Left    Family History:  Family History  Problem Relation Age of Onset  . Lung cancer Mother   . Hyperlipidemia Brother   . Colon cancer Neg Hx   . Esophageal cancer Neg Hx   . Stomach  cancer Neg Hx    Family Psychiatric  History: see H&P Social History:  Social History   Substance and Sexual Activity  Alcohol Use Not Currently   Comment: social     Social History   Substance and Sexual Activity  Drug Use No    Social History   Socioeconomic History  . Marital status: Married    Spouse name: Not on  file  . Number of children: Not on file  . Years of education: Not on file  . Highest education level: Not on file  Occupational History  . Not on file  Social Needs  . Financial resource strain: Not on file  . Food insecurity:    Worry: Not on file    Inability: Not on file  . Transportation needs:    Medical: Not on file    Non-medical: Not on file  Tobacco Use  . Smoking status: Current Every Day Smoker    Packs/day: 0.50    Types: Cigarettes  . Smokeless tobacco: Never Used  Substance and Sexual Activity  . Alcohol use: Not Currently    Comment: social  . Drug use: No  . Sexual activity: Not on file  Lifestyle  . Physical activity:    Days per week: Not on file    Minutes per session: Not on file  . Stress: Not on file  Relationships  . Social connections:    Talks on phone: Not on file    Gets together: Not on file    Attends religious service: Not on file    Active member of club or organization: Not on file    Attends meetings of clubs or organizations: Not on file    Relationship status: Not on file  Other Topics Concern  . Not on file  Social History Narrative  . Not on file   Additional Social History:                         Sleep: Good  Appetite:  Good  Current Medications: Current Facility-Administered Medications  Medication Dose Route Frequency Provider Last Rate Last Dose  . acetaminophen (TYLENOL) tablet 650 mg  650 mg Oral Q4H PRN Charm Rings, NP      . alum & mag hydroxide-simeth (MAALOX/MYLANTA) 200-200-20 MG/5ML suspension 30 mL  30 mL Oral Q6H PRN Charm Rings, NP      . ARIPiprazole (ABILIFY) tablet 15 mg  15 mg Oral Daily Micheal Likens, MD   15 mg at 11/27/17 0819  . buPROPion (WELLBUTRIN XL) 24 hr tablet 150 mg  150 mg Oral Daily Micheal Likens, MD   150 mg at 11/27/17 0819  . carbamazepine (TEGRETOL) tablet 200 mg  200 mg Oral BID Charm Rings, NP   200 mg at 11/27/17 8295  . hydrOXYzine  (ATARAX/VISTARIL) tablet 25 mg  25 mg Oral TID PRN Jackelyn Poling, NP   25 mg at 11/25/17 0640  . lamoTRIgine (LAMICTAL) tablet 200 mg  200 mg Oral Daily Charm Rings, NP   200 mg at 11/27/17 0819  . magnesium hydroxide (MILK OF MAGNESIA) suspension 30 mL  30 mL Oral Daily PRN Charm Rings, NP      . nicotine (NICODERM CQ - dosed in mg/24 hours) patch 21 mg  21 mg Transdermal Daily Charm Rings, NP   21 mg at 11/27/17 0818  . ondansetron (ZOFRAN) tablet 4 mg  4 mg Oral Q8H  PRN Charm Rings, NP      . pantoprazole (PROTONIX) EC tablet 40 mg  40 mg Oral Daily Charm Rings, NP   40 mg at 11/27/17 0819  . pravastatin (PRAVACHOL) tablet 20 mg  20 mg Oral Daily Charm Rings, NP   20 mg at 11/27/17 0819  . traZODone (DESYREL) tablet 50 mg  50 mg Oral QHS,MR X 1 Nira Conn A, NP   50 mg at 11/26/17 2106    Lab Results: No results found for this or any previous visit (from the past 48 hour(s)).  Blood Alcohol level:  Lab Results  Component Value Date   ETH <10 11/24/2017   [facility]  01/18/2008    <5        LOWEST DETECTABLE LIMIT FOR SERUM ALCOHOL IS 11 mg/dL FOR MEDICAL PURPOSES ONLY    Metabolic Disorder Labs: No results found for: HGBA1C, MPG No results found for: PROLACTIN No results found for: CHOL, TRIG, HDL, CHOLHDL, VLDL, LDLCALC  Physical Findings: AIMS:  , ,  ,  ,    CIWA:    COWS:     Musculoskeletal: Strength & Muscle Tone: within normal limits Gait & Station: normal Patient leans: N/A  Psychiatric Specialty Exam: Physical Exam  Nursing note and vitals reviewed.   Review of Systems  Constitutional: Negative for chills and fever.  Respiratory: Negative for cough and shortness of breath.   Cardiovascular: Negative for chest pain.  Gastrointestinal: Negative for abdominal pain, heartburn, nausea and vomiting.  Psychiatric/Behavioral: Negative for depression, hallucinations and suicidal ideas. The patient is not nervous/anxious and does not have insomnia.      Blood pressure (!) 86/56, pulse 96, temperature 98.4 F (36.9 C), temperature source Oral, resp. rate 18, height 5' 0.5" (1.537 m), weight 68.5 kg, SpO2 100 %.Body mass index is 29 kg/m.  General Appearance: Casual and Fairly Groomed  Eye Contact:  Good  Speech:  Clear and Coherent and Normal Rate  Volume:  Normal  Mood:  Anxious  Affect:  Congruent and Constricted  Thought Process:  Coherent, Goal Directed and Descriptions of Associations: Loose  Orientation:  Full (Time, Place, and Person)  Thought Content:  Delusions, Ideas of Reference:   Paranoia Delusions, Paranoid Ideation and Rumination  Suicidal Thoughts:  No  Homicidal Thoughts:  No  Memory:  Immediate;   Fair Recent;   Fair Remote;   Fair  Judgement:  Poor  Insight:  Lacking  Psychomotor Activity:  Normal  Concentration:  Concentration: Fair  Recall:  [employer] of Knowledge:  Fair  Language:  Fair  Akathisia:  No  Handed:    AIMS (if indicated):     Assets:  Communication Skills Resilience Social Support  ADL's:  Intact  Cognition:  WNL  Sleep:  Number of Hours: 6   Treatment Plan Summary: Daily contact with patient to assess and evaluate symptoms and progress in treatment and Medication management   -Continue inpatient hospitalization  -Bipolar I, current episode manic with psychotic features             -Continue abilify 15mg  po qDay             -Continue wellbutrin XL 150mg  po qDay             -Continue tegretol 200mg  po BID             -Continue lamictal 200mg  po qDay  -anxiety             -  continue vistaril 25mg  po TID prn anxiety  -GERD             -Continue protonix 40mg  po qDay  -HLD             -Continue pravastatin 20mg  po qDay  -insomnia             -Continue trazodone 50mg  po qhs prn insomnia (may repeat x1 prn insomnia)  -Encourage participation in groups and therapeutic milieu  -disposition planning will be ongoing   Micheal Likens, MD 11/27/2017, 1:52  PM

## 2017-11-27 NOTE — [facility] Group Notes (Signed)
[facility] Group Notes:  (Nursing/MHT/Case Management/Adjunct)  Date:  11/27/2017  Time:  4:29 PM  Type of Therapy:  Psychoeducational Skills  Participation Level:  Did Not Attend   Audrie Lia Bernis Schreur 11/27/2017, 4:29 PM

## 2017-11-27 NOTE — Progress Notes (Signed)
Adult Psychoeducational Group Note  Date:  11/27/2017 Time:  8:42 PM  Group Topic/Focus:  Wrap-Up Group:   The focus of this group is to help patients review their daily goal of treatment and discuss progress on daily workbooks.  Participation Level:  Active  Participation Quality:  Appropriate  Affect:  Appropriate  Cognitive:  Alert  Insight: Appropriate  Engagement in Group:  Engaged  Modes of Intervention:  Discussion  Additional Comments:  Patient's goal for today was to get out of here. Patient stated she has been sleepy all day.  Dutch Ing L Kailia Starry 11/27/2017, 8:42 PM

## 2017-11-28 MED ORDER — BUPROPION HCL 75 MG PO TABS
75.0000 mg | ORAL_TABLET | ORAL | Status: AC
  Administered 2017-11-29 – 2017-12-01 (×3): 75 mg via ORAL
  Filled 2017-11-28 (×3): qty 1

## 2017-11-28 MED ORDER — ALBUTEROL SULFATE HFA 108 (90 BASE) MCG/ACT IN AERS: 2.0000 | INHALATION_SPRAY | Freq: Four times a day (QID) | RESPIRATORY_TRACT | Status: DC | PRN

## 2017-11-28 MED ORDER — RISPERIDONE 0.5 MG PO TABS
0.5000 mg | ORAL_TABLET | Freq: Two times a day (BID) | ORAL | Status: DC
  Administered 2017-11-28 – 2017-11-29 (×3): 0.5 mg via ORAL
  Filled 2017-11-28 (×8): qty 1

## 2017-11-28 MED ORDER — TRAZODONE HCL 50 MG PO TABS
50.0000 mg | ORAL_TABLET | Freq: Every evening | ORAL | Status: DC | PRN
  Administered 2017-11-28 – 2017-12-01 (×4): 50 mg via ORAL
  Filled 2017-11-28 (×3): qty 1

## 2017-11-28 NOTE — Progress Notes (Signed)
Pt did attend group, and actively participated. 

## 2017-11-28 NOTE — [facility] Group Notes (Signed)
[facility] LCSW Group Therapy  11/28/2017  1:05 PM  Type of Therapy:  Group therapy  Participation Level:  Active  Participation Quality:  Attentive  Affect:  Flat  Cognitive:  Oriented  Insight:  Limited  Engagement in Therapy:  Limited  Modes of Intervention:  Discussion, Socialization  Summary of Progress/Problems:  Chaplain was here to lead a group on themes of hope and courage. Stayed the entire time, engaged throughout.  Minimal interaction.  Continues vague, but was able to make a statement about hope.  Daryel Gerald B 11/28/2017 1:17 PM

## 2017-11-28 NOTE — Progress Notes (Signed)
Recreation Therapy Notes  Date: 10.11.19 Time: 1000 Location: 500 Hall Dayroom   Group Topic: Communication, Team Building, Problem Solving  Goal Area(s) Addresses:  Patient will effectively work with peer towards shared goal.  Patient will identify skills used to make activity successful.  Patient will identify how skills used during activity can be used to reach post d/c goals.   Behavioral Response: Engaged  Intervention: STEM Activity, Music  Activity: Stage manager. In teams patients were given 12 plastic drinking straws and a length of masking tape. Using the materials provided patients were asked to build a landing pad to catch a golf ball dropped from approximately 6 feet in the air.   Education: Pharmacist, community, Discharge Planning   Education Outcome: Acknowledges education/In group clarification offered/Needs additional education.   Clinical Observations/Feedback: Pt was engaged and social with peers.  Pt gave and received suggestions from peers.  Pt seemed to take on more of the leadership role in the group.  Pt was pleasant and able to focus on group.  Pt stated they had to use teamwork to complete the landing pad.  Pt stated with your support system, you may have to make changes to your plan when something doesn't work.    Caroll Rancher, LRT/CTRS     Lillia Abed, Morris Markham A 11/28/2017 11:21 AM
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
## 2017-11-28 NOTE — Progress Notes (Signed)
Nursing Progress Note: 7p-7a D: Pt currently presents with a anxious/depressed/flat affect and behavior. Pt states "I think the shot is giving me an allergic reaction." Interacting appropriately with the milieu. Pt reports good sleep during the previous night with current medication regimen. Pt did not attend wrap-up group.  A: Pt skin around injection site proves consistent with generalized skin pigmentation. No sign of allergic reaction. Pt provided with medications per providers orders. Pt's labs and vitals were monitored throughout the night. Pt supported emotionally and encouraged to express concerns and questions. Pt educated on medications.  R: Pt's safety ensured with 15 minute and environmental checks. Pt currently denies SI, HI, and VH and endorses AH. Pt verbally contracts to seek staff if SI,HI, or AVH occurs and to consult with staff before acting on any harmful thoughts. Will continue to monitor.

## 2017-11-28 NOTE — Progress Notes (Signed)
DAR NOTE: Pt present with flat affect and depressed mood in the unit. Pt has been isolating herself and has been bed most of the time. Pt denies physical pain, took all his meds as scheduled. As per self inventory, pt had a fair night sleep, fair appetite, low energy, and good concentration. Pt rate depression at 0, hopeless ness at 0. Pt's safety ensured with 15 minute and environmental checks. Pt currently denies SI/HI and A/V hallucinations. Pt verbally agrees to seek staff if SI/HI or A/VH occurs and to consult with staff before acting on these thoughts. Will continue POC.

## 2017-11-29 MED ORDER — RISPERIDONE 0.5 MG PO TABS
0.5000 mg | ORAL_TABLET | Freq: Every day | ORAL | Status: DC
  Administered 2017-11-29 – 2017-11-30 (×2): 0.5 mg via ORAL
  Filled 2017-11-29 (×3): qty 1

## 2017-11-29 MED ORDER — RISPERIDONE 1 MG PO TABS
1.0000 mg | ORAL_TABLET | Freq: Every day | ORAL | Status: DC
  Administered 2017-11-29 – 2017-12-01 (×3): 1 mg via ORAL
  Filled 2017-11-29 (×6): qty 1

## 2017-11-29 NOTE — [facility] Group Notes (Signed)
  [facility] LCSW Group Therapy Note  Date/Time:  11/29/2017 11:15AM-12:00PM  Type of Therapy and Topic:  Group Therapy:  Reasons for Hospitalizations  Participation Level:  Active   Description of Group This process group involved patients discussing their thoughts about why people, in their experiences, may need to be hospitalized or may be seen by family members/people in the community as needing to be hospitalized.   Feelings related to these reasons were discussed and group members were allowed to vent their positive and negative reactions.  The group then brainstormed specific ways in which they could take care of themselves outside of the hospital in order to avoid rehospitalization.  CSW ensured that this list included such items as staying on medications, going to aftercare appointments, taking care of sleep hygiene, using a pillbox, open communication with support systems, and truthfully acknowledging symptoms to doctors, therapists, and supporters.  Therapeutic Goals 1. Patient will identify a variety of reasons that exist for people to become psychiatrically hospitalized, and will discuss why it may be necessary even though it is not desired/liked. 2. Patient will verbalize benefits of hospitalization to those who need it and ways in which those benefits can be replicated in an outpatient setting. 3. Patients will brainstorm together ways they can take care of themselves after discharge in order to avoid rehospitalization.  Summary of Patient Progress:  The patient expressed that she is in the hospital to make sure she is on the right medications.   In response to another patient's question, CSW provided psychoeducation about the diagnoses of Bipolar disorder, Schizophrenia, and Schizoaffective disorder.  The patient expressed an interest in getting a print-out about her own illness, and she expressed great interest in the explanations given about the [employer] [facility]  services and support groups.  She displayed insight and was goal-oriented.  Therapeutic Modalities Cognitive Behavioral Therapy Motivational Interviewing    Ambrose Mantle, LCSW 11/29/2017, 8:47 AM
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
## 2017-11-29 NOTE — Progress Notes (Signed)
Writer spoke with patient 1:1 and she reports having had a good day. She attended group and returned to her room and ate her snack. Writer informed her of her medication scheduled and she requested trazodone to help her sleep. She reports her goal is to get her medications straightened out and go home. Support given and safety maintained on unit with 15 min checks.

## 2017-11-29 NOTE — Progress Notes (Signed)
Adult Psychoeducational Group Note  Date:  11/29/2017 Time:  10:10 PM  Group Topic/Focus:  Wrap-Up Group:   The focus of this group is to help patients review their daily goal of treatment and discuss progress on daily workbooks.  Participation Level:  Active  Participation Quality:  Appropriate  Affect:  Appropriate  Cognitive:  Appropriate  Insight: Appropriate  Engagement in Group:  Engaged  Modes of Intervention:  Discussion  Additional Comments: The patient expressed that she rates today a 10 and attended groups.  Octavio Manns 11/29/2017, 10:10 PM

## 2017-11-29 NOTE — Plan of Care (Signed)
  Problem: Safety: Goal: Periods of time without injury will increase Outcome: Progressing   Problem: Nutritional: Goal: Ability to achieve adequate nutritional intake will improve Outcome: Progressing DAR Note: Pt visible in dayroom at long intervals during shift. Presents isolative, paranoid with flat affect and depressed mood. Pt does forwards on interactions. Denies SI, HI, AVH and pain. Rates her depression 0/10, hopelessness 0/10 and anxiety 2/10 on self inventory sheet. Reports she slept well last night with good appetite, low energy and good concentration level. Pt's goal today "going home, taking care of some financial issues that need attending to today".  Scheduled medications given as ordered with verbal education and effects monitored. Safety checks maintained at Q 15 minutes intervals without self harm gestures or outburst. Emotional support provided to pt throughout this shift. Encouraged pt to voice concerns, attend to ADLs and comply with current treatment regimen including groups.  Pt receptive to care. Compliant with medications. Denies side effects. Tolerates all PO intake well. POC continues for safety and mood stability.

## 2017-11-29 NOTE — [facility] Group Notes (Signed)
[facility] Group Notes:  (Nursing)  Date:  11/29/2017  Time: 1000 Type of Therapy:  Nurse Education  Participation Level:  Active  Participation Quality:  Appropriate and Attentive  Affect:  Appropriate  Cognitive:  Alert and Appropriate  Insight:  Good  Engagement in Group:  Engaged  Modes of Intervention:  Discussion and Education  Summary of Progress/Problems: Nurse led group played a learning/communication board game that fosters listening skills as well as self expression.  Shela Nevin 11/29/2017, 4:06 PM

## 2017-11-30 DIAGNOSIS — K219 Gastro-esophageal reflux disease without esophagitis: Secondary | ICD-10-CM

## 2017-11-30 DIAGNOSIS — G4700 Insomnia, unspecified: Secondary | ICD-10-CM

## 2017-11-30 DIAGNOSIS — E785 Hyperlipidemia, unspecified: Secondary | ICD-10-CM

## 2017-11-30 DIAGNOSIS — I10 Essential (primary) hypertension: Secondary | ICD-10-CM

## 2017-11-30 DIAGNOSIS — F419 Anxiety disorder, unspecified: Secondary | ICD-10-CM

## 2017-11-30 LAB — CARBAMAZEPINE LEVEL, TOTAL: CARBAMAZEPINE LVL: 8.3 ug/mL (ref 4.0–12.0)

## 2017-11-30 LAB — CBC WITH DIFFERENTIAL/PLATELET
Abs Immature Granulocytes: 0.01 10*3/uL (ref 0.00–0.07)
BASOS PCT: 1 %
Basophils Absolute: 0.1 10*3/uL (ref 0.0–0.1)
EOS ABS: 0.3 10*3/uL (ref 0.0–0.5)
EOS PCT: 3 %
HCT: 48.9 % — ABNORMAL HIGH (ref 36.0–46.0)
Hemoglobin: 15.9 g/dL — ABNORMAL HIGH (ref 12.0–15.0)
Immature Granulocytes: 0 %
Lymphocytes Relative: 44 %
Lymphs Abs: 3.4 10*3/uL (ref 0.7–4.0)
MCH: 29.9 pg (ref 26.0–34.0)
MCHC: 32.5 g/dL (ref 30.0–36.0)
MCV: 91.9 fL (ref 80.0–100.0)
MONO ABS: 0.7 10*3/uL (ref 0.1–1.0)
MONOS PCT: 9 %
Neutro Abs: 3.3 10*3/uL (ref 1.7–7.7)
Neutrophils Relative %: 43 %
Platelets: 387 10*3/uL (ref 150–400)
RBC: 5.32 MIL/uL — ABNORMAL HIGH (ref 3.87–5.11)
RDW: 12.5 % (ref 11.5–15.5)
WBC: 7.7 10*3/uL (ref 4.0–10.5)
nRBC: 0 % (ref 0.0–0.2)

## 2017-11-30 LAB — HEPATIC FUNCTION PANEL
ALT: 26 U/L (ref 0–44)
AST: 17 U/L (ref 15–41)
Albumin: 3.7 g/dL (ref 3.5–5.0)
Alkaline Phosphatase: 92 U/L (ref 38–126)
BILIRUBIN DIRECT: 0.1 mg/dL (ref 0.0–0.2)
BILIRUBIN INDIRECT: 0.3 mg/dL (ref 0.3–0.9)
TOTAL PROTEIN: 7.2 g/dL (ref 6.5–8.1)
Total Bilirubin: 0.4 mg/dL (ref 0.3–1.2)

## 2017-11-30 LAB — HEMOGLOBIN A1C
Hgb A1c MFr Bld: 5.2 % (ref 4.8–5.6)
MEAN PLASMA GLUCOSE: 102.54 mg/dL

## 2017-11-30 LAB — LIPID PANEL
CHOL/HDL RATIO: 3.5 ratio
Cholesterol: 166 mg/dL (ref 0–200)
HDL: 48 mg/dL (ref >40)
LDL CALC: 100 mg/dL — AB (ref 0–99)
Triglycerides: 91 mg/dL (ref <150)
VLDL: 18 mg/dL (ref 0–40)

## 2017-11-30 LAB — TSH: TSH: 1.717 u[IU]/mL (ref 0.350–4.500)

## 2017-11-30 MED ORDER — RISPERIDONE 0.25 MG PO TABS
0.2500 mg | ORAL_TABLET | Freq: Every day | ORAL | Status: DC
  Administered 2017-12-01 – 2017-12-02 (×2): 0.25 mg via ORAL
  Filled 2017-11-30 (×3): qty 1

## 2017-11-30 NOTE — [facility] Group Notes (Signed)
Pt was invited but did not attend orientation/goals group. 

## 2017-11-30 NOTE — [facility] Group Notes (Signed)
[facility] LCSW Group Therapy Note  Date/Time:  11/30/2017  11:00AM-12:00PM  Type of Therapy and Topic:  Group Therapy:  Music and Mood  Participation Level:  Active   Description of Group: In this process group, members listened to a variety of genres of music and identified that different types of music evoke different responses.  Patients were encouraged to identify music that was soothing for them and music that was energizing for them.  Patients discussed how this knowledge can help with wellness and recovery in various ways including managing depression and anxiety as well as encouraging healthy sleep habits.    Therapeutic Goals: 1. Patients will explore the impact of different varieties of music on mood 2. Patients will verbalize the thoughts they have when listening to different types of music 3. Patients will identify music that is soothing to them as well as music that is energizing to them 4. Patients will discuss how to use this knowledge to assist in maintaining wellness and recovery 5. Patients will explore the use of music as a coping skill  Summary of Patient Progress:  At the beginning of group, patient expressed that she was very, very tired, rating it a 9 out of 10.  At the end of group she stated she was no longer tired at all.  Therapeutic Modalities: Solution Focused Brief Therapy Activity   Ambrose Mantle, LCSW

## 2017-11-30 NOTE — Progress Notes (Signed)
[facility] MD Progress Note  11/30/2017 1:12 PM Chloe [name]  MRN:  [id number] Subjective:  Chloe [name] is a 52 y/o F with history of bipolar disorder who was admitted on IVC from [facility] where she presented brought in by police with worsening symptoms of AH, paranoia, delusions, disorganized behavior, distractibility, flight of ideas, and decreased need for sleep. She was medically cleared and then transferred to [facility] for additional treatment and stabilization.She was restarted on home medications of lamictal and wellbutrin (at lowered dose). She was also restarted on previous medication of tegretol in the ED. Upon arrival at [facility] she was started on trial of abilify to address symptoms of psychosis.Pt has been demonstrating improvement of decreased need for sleep, thought disorganization, AH, and pressured symptoms of mania, but she has some ongoing paranoia.  Objective: Patient is seen and examined.  Patient is a 52 year old female with the above-stated past psychiatric history seen in follow-up.  She is about the same as yesterday.  She still does not discuss greatly about her paranoid delusions about her husband "having a hit out on her".  I tried asked some open-ended questions and her basic discussion was "I have already taken my wet or wearing off, and he knows what my plans are when I leave him".  She denied any desire to harm him.  Denied any suicidal ideation.  He is a bit sedated, and we discussed potentially decreasing her daytime Risperdal to 0.25 mg.  Her Tegretol level this morning is 8.3.  Her CBC with differential is stable, with a mild elevation in hemoglobin and hematocrit consistent with her smoking status.  She denied any suicidal or homicidal ideation no racing thoughts, no pressured speech.  She slept 6.75 hours last night.  Vital signs are stable and she is afebrile. Principal Problem: Bipolar I disorder, current or most recent episode manic, with psychotic features (HCC) Diagnosis:    Patient Active Problem List   Diagnosis Date Noted  . Bipolar affective disorder, depressed, severe, with psychotic behavior (HCC) [F31.5] 11/24/2017  . Bipolar I disorder, current or most recent episode manic, with psychotic features (HCC) [F31.2] 11/24/2017  . Acute sciatica [M54.30] 07/18/2017  . Backache with radiation [M54.9] 07/18/2017   Total Time spent with patient: 15 minutes  Past Psychiatric History: See admission H&P  Past Medical History:  Past Medical History:  Diagnosis Date  . Anxiety   . Bipolar disorder (HCC)   . Breast mass, right   . GERD (gastroesophageal reflux disease)   . Hyperlipidemia   . Post-operative nausea and vomiting   . Smoker     Past Surgical History:  Procedure Laterality Date  . ABDOMINAL HYSTERECTOMY     had nausea and vomiting post-op  . BREAST LUMPECTOMY WITH RADIOACTIVE SEED LOCALIZATION Right 11/12/2016   Procedure: RIGHT BREAST LUMPECTOMY WITH RADIOACTIVE SEED LOCALIZATION;  Surgeon: Harriette Bouillon, MD;  Location: [facility];  Service: General;  Laterality: Right;  . CHOLECYSTECTOMY    . LAPAROSCOPIC LYSIS OF ADHESIONS    . MYOMECTOMY    . PLANTAR FASCIA SURGERY Left    Family History:  Family History  Problem Relation Age of Onset  . Lung cancer Mother   . Hyperlipidemia Brother   . Colon cancer Neg Hx   . Esophageal cancer Neg Hx   . Stomach cancer Neg Hx    Family Psychiatric  History: See admission H&P Social History:  Social History   Substance and Sexual Activity  Alcohol Use Not Currently  Comment: social     Social History   Substance and Sexual Activity  Drug Use No    Social History   Socioeconomic History  . Marital status: Married    Spouse name: Not on file  . Number of children: Not on file  . Years of education: Not on file  . Highest education level: Not on file  Occupational History  . Not on file  Social Needs  . Financial resource strain: Not on file  . Food insecurity:     Worry: Not on file    Inability: Not on file  . Transportation needs:    Medical: Not on file    Non-medical: Not on file  Tobacco Use  . Smoking status: Current Every Day Smoker    Packs/day: 0.50    Types: Cigarettes  . Smokeless tobacco: Never Used  Substance and Sexual Activity  . Alcohol use: Not Currently    Comment: social  . Drug use: No  . Sexual activity: Not on file  Lifestyle  . Physical activity:    Days per week: Not on file    Minutes per session: Not on file  . Stress: Not on file  Relationships  . Social connections:    Talks on phone: Not on file    Gets together: Not on file    Attends religious service: Not on file    Active member of club or organization: Not on file    Attends meetings of clubs or organizations: Not on file    Relationship status: Not on file  Other Topics Concern  . Not on file  Social History Narrative  . Not on file   Additional Social History:                         Sleep: Good  Appetite:  Good  Current Medications: Current Facility-Administered Medications  Medication Dose Route Frequency Provider Last Rate Last Dose  . acetaminophen (TYLENOL) tablet 650 mg  650 mg Oral Q4H PRN Charm Rings, NP      . albuterol (PROVENTIL HFA;VENTOLIN HFA) 108 (90 Base) MCG/ACT inhaler 2 puff  2 puff Inhalation Q6H PRN Micheal Likens, MD      . alum & mag hydroxide-simeth (MAALOX/MYLANTA) 200-200-20 MG/5ML suspension 30 mL  30 mL Oral Q6H PRN Charm Rings, NP      . buPROPion [facility]) tablet 75 mg  75 mg Oral Veatrice Kells, MD   75 mg at 11/30/17 0809  . carbamazepine (TEGRETOL) tablet 200 mg  200 mg Oral BID Charm Rings, NP   200 mg at 11/30/17 0809  . hydrOXYzine (ATARAX/VISTARIL) tablet 25 mg  25 mg Oral TID PRN Nira Conn A, NP   25 mg at 11/30/17 0811  . lamoTRIgine (LAMICTAL) tablet 200 mg  200 mg Oral Daily Charm Rings, NP   200 mg at 11/30/17 0809  . magnesium hydroxide  (MILK OF MAGNESIA) suspension 30 mL  30 mL Oral Daily PRN Charm Rings, NP      . nicotine (NICODERM CQ - dosed in mg/24 hours) patch 21 mg  21 mg Transdermal Daily Charm Rings, NP   21 mg at 11/30/17 0809  . ondansetron (ZOFRAN) tablet 4 mg  4 mg Oral Q8H PRN Charm Rings, NP      . pantoprazole (PROTONIX) EC tablet 40 mg  40 mg Oral Daily Charm Rings, NP   40  mg at 11/30/17 0809  . pravastatin (PRAVACHOL) tablet 20 mg  20 mg Oral Daily Charm Rings, NP   20 mg at 11/30/17 0809  . [START ON 12/01/2017] risperiDONE (RISPERDAL) tablet 0.25 mg  0.25 mg Oral Daily Antonieta Pert, MD      . risperiDONE (RISPERDAL) tablet 1 mg  1 mg Oral QHS Antonieta Pert, MD   1 mg at 11/29/17 2108  . traZODone (DESYREL) tablet 50 mg  50 mg Oral QHS PRN Micheal Likens, MD   50 mg at 11/29/17 2108    Lab Results:  Results for orders placed or performed during the hospital encounter of 11/24/17 (from the past 48 hour(s))  Hemoglobin A1c     Status: None   Collection Time: 11/30/17  6:34 AM  Result Value Ref Range   Hgb A1c MFr Bld 5.2 4.8 - 5.6 %    Comment: (NOTE) Pre diabetes:          5.7%-6.4% Diabetes:              >6.4% Glycemic control for   <7.0% adults with diabetes    Mean Plasma Glucose 102.54 mg/dL    Comment: Performed at [facility], [address] [place], [place] [address]  Lipid panel     Status: Abnormal   Collection Time: 11/30/17  6:34 AM  Result Value Ref Range   Cholesterol 166 0 - 200 mg/dL   Triglycerides 91 <604 mg/dL   HDL 48 >54 mg/dL   Total CHOL/HDL Ratio 3.5 RATIO   VLDL 18 0 - 40 mg/dL   LDL Cholesterol 098 (H) 0 - 99 mg/dL    Comment:        Total Cholesterol/HDL:CHD Risk Coronary Heart Disease Risk Table                     Men   Women  1/2 Average Risk   3.4   3.3  Average Risk       5.0   4.4  2 X Average Risk   9.6   7.1  3 X Average Risk  23.4   11.0        Use the calculated Patient Ratio above and the CHD Risk  Table to determine the patient's CHD Risk.        ATP III CLASSIFICATION (LDL):  <100     mg/dL   Optimal  119-147  mg/dL   Near or Above                    Optimal  130-159  mg/dL   Borderline  829-562  mg/dL   High  >130     mg/dL   Very High Performed at [facility], [address], [place], [place] [address]   TSH     Status: None   Collection Time: 11/30/17  6:34 AM  Result Value Ref Range   TSH 1.717 0.350 - 4.500 uIU/mL    Comment: Performed by a 3rd Generation assay with a functional sensitivity of <=0.01 uIU/mL. Performed at [facility], [address] [place], [place] [address]   CBC with Differential/Platelet     Status: Abnormal   Collection Time: 11/30/17  6:34 AM  Result Value Ref Range   WBC 7.7 4.0 - 10.5 K/uL   RBC 5.32 (H) 3.87 - 5.11 MIL/uL   Hemoglobin 15.9 (H) 12.0 - 15.0 g/dL   HCT 95.2 (  H) 36.0 - 46.0 %   MCV 91.9 80.0 - 100.0 fL   MCH 29.9 26.0 - 34.0 pg   MCHC 32.5 30.0 - 36.0 g/dL   RDW 16.1 09.6 - 04.5 %   Platelets 387 150 - 400 K/uL   nRBC 0.0 0.0 - 0.2 %   Neutrophils Relative % 43 %   Neutro Abs 3.3 1.7 - 7.7 K/uL   Lymphocytes Relative 44 %   Lymphs Abs 3.4 0.7 - 4.0 K/uL   Monocytes Relative 9 %   Monocytes Absolute 0.7 0.1 - 1.0 K/uL   Eosinophils Relative 3 %   Eosinophils Absolute 0.3 0.0 - 0.5 K/uL   Basophils Relative 1 %   Basophils Absolute 0.1 0.0 - 0.1 K/uL   Immature Granulocytes 0 %   Abs Immature Granulocytes 0.01 0.00 - 0.07 K/uL    Comment: Performed at [facility], [address] [place], [place] [address]  Hepatic function panel     Status: None   Collection Time: 11/30/17  6:34 AM  Result Value Ref Range   Total Protein 7.2 6.5 - 8.1 g/dL   Albumin 3.7 3.5 - 5.0 g/dL   AST 17 15 - 41 U/L   ALT 26 0 - 44 U/L   Alkaline Phosphatase 92 38 - 126 U/L   Total Bilirubin 0.4 0.3 - 1.2 mg/dL   Bilirubin, Direct 0.1 0.0 - 0.2 mg/dL   Indirect Bilirubin 0.3 0.3 -  0.9 mg/dL    Comment: Performed at [facility], [address] [place], [place] [address]  Carbamazepine level, total     Status: None   Collection Time: 11/30/17  6:34 AM  Result Value Ref Range   Carbamazepine Lvl 8.3 4.0 - 12.0 ug/mL    Comment: Performed at [facility], [address] [place], [place] [address]    Blood Alcohol level:  Lab Results  Component Value Date   ETH <10 11/24/2017   ETH  01/18/2008    <5        LOWEST DETECTABLE LIMIT FOR SERUM ALCOHOL IS 11 mg/dL FOR MEDICAL PURPOSES ONLY    Metabolic Disorder Labs: Lab Results  Component Value Date   HGBA1C 5.2 11/30/2017   MPG 102.54 11/30/2017   No results found for: PROLACTIN Lab Results  Component Value Date   CHOL 166 11/30/2017   TRIG 91 11/30/2017   HDL 48 11/30/2017   CHOLHDL 3.5 11/30/2017   VLDL 18 11/30/2017   LDLCALC 100 (H) 11/30/2017    Physical Findings: AIMS:  , ,  ,  ,    CIWA:    COWS:     Musculoskeletal: Strength & Muscle Tone: within normal limits Gait & Station: normal Patient leans: N/A  Psychiatric Specialty Exam: Physical Exam  Nursing note and vitals reviewed. Constitutional: She is oriented to person, place, and time. She appears well-developed and well-nourished.  HENT:  Head: Normocephalic and atraumatic.  Respiratory: Effort normal.  Neurological: She is alert and oriented to person, place, and time.    ROS  Blood pressure 115/76, pulse 86, temperature 97.9 F (36.6 C), resp. rate 18, height 5' 0.5" (1.537 m), weight 68.5 kg, SpO2 100 %.Body mass index is 29 kg/m.  General Appearance: Casual  Eye Contact:  Fair  Speech:  Normal Rate  Volume:  Normal  Mood:  Euthymic  Affect:  Congruent  Thought Process:  Coherent and Descriptions of Associations: Intact  Orientation:  Full (Time, Place, and  Person)  Thought Content:  Paranoid Ideation  Suicidal Thoughts:  No  Homicidal Thoughts:  No  Memory:  Immediate;   Fair Recent;    Fair Remote;   Fair  Judgement:  Intact  Insight:  Fair  Psychomotor Activity:  Normal  Concentration:  Concentration: Fair and Attention Span: Fair  Recall:  [employer] of Knowledge:  Fair  Language:  Fair  Akathisia:  Negative  Handed:  Right  AIMS (if indicated):     Assets:  Desire for Improvement Financial Resources/Insurance Housing Physical Health Resilience  ADL's:  Intact  Cognition:  WNL  Sleep:  Number of Hours: 6.75     Treatment Plan Summary: Daily contact with patient to assess and evaluate symptoms and progress in treatment, Medication management and Plan : Patient is seen and examined.  Patient is a 52 year old female with the above-stated past psychiatric history who is seen in follow-up.  #1 bipolar disorder type I, current episode manic with psychotic features-she will remain on her Wellbutrin x1-2 more days and stop.  We will decrease her risk for doll to 0.25 mg p.o. daily and continue the 1.0 mg nightly.  Her Tegretol level is approximately 8, and her 200 mg p.o. twice daily will be continued.  Lamictal 200 mg p.o. daily will also be continued.  CBC with differential and LFTs were also normal this a.m.  #2 anxiety-continue Vistaril 25 mg p.o. 3 times daily as needed.  #3 GERD-continue Protonix.  #4 hyperlipidemia-continue pravastatin 20 mg p.o. daily.  #5 hypertension-no change in current medications.  #6 insomnia-no change in trazodone 50 mg p.o. nightly.  #7 discharge planning in progress  Antonieta Pert, MD 11/30/2017, 1:12 PM

## 2017-11-30 NOTE — Progress Notes (Signed)
Patient ID: Chloe [name], female   DOB: [birth year], 52 y.o.   MRN: [id number]   D: Pt has been flat and depressed on the unit today, she has also remained very paranoid. Pt did not attend any groups nor did she engage in any treatment. Pt remained isolative to her room today, and did not engage with staff or peers. Pt took all medications without any problems, no issues or concerns noted. Pt reported that she was not hopeless, depressed, or anxious. Pt reported that she was just ready to go home. Pt reported that her goal for today was to rest and go home. Pt reported being negative SI/HI, no AH/VH noted. A: 15 min checks continued for patient safety. R: Pt safety maintained.

## 2017-12-01 LAB — PROLACTIN: PROLACTIN: 12.4 ng/mL (ref 4.8–23.3)
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
## 2017-12-01 NOTE — Tx Team (Signed)
Interdisciplinary Treatment and Diagnostic Plan Update  12/01/2017 Time of Session: 2703 Chloe [name] MRN: [id number]  Principal Diagnosis: Bipolar I disorder, current or most recent episode manic, with psychotic features ([place])  Secondary Diagnoses: Principal Problem:   Bipolar I disorder, current or most recent episode manic, with psychotic features ([place])   Current Medications:  Current Facility-Administered Medications  Medication Dose Route Frequency Provider Last Rate Last Dose  . acetaminophen (TYLENOL) tablet 650 mg  650 mg Oral Q4H PRN Patrecia Pour, NP   650 mg at 11/30/17 2045  . albuterol (PROVENTIL HFA;VENTOLIN HFA) 108 (90 Base) MCG/ACT inhaler 2 puff  2 puff Inhalation Q6H PRN Pennelope Bracken, MD      . alum & mag hydroxide-simeth (MAALOX/MYLANTA) 200-200-20 MG/5ML suspension 30 mL  30 mL Oral Q6H PRN Patrecia Pour, NP      . carbamazepine (TEGRETOL) tablet 200 mg  200 mg Oral BID Patrecia Pour, NP   200 mg at 12/01/17 0755  . hydrOXYzine (ATARAX/VISTARIL) tablet 25 mg  25 mg Oral TID PRN Lindon Romp A, NP   25 mg at 11/30/17 0811  . lamoTRIgine (LAMICTAL) tablet 200 mg  200 mg Oral Daily Patrecia Pour, NP   200 mg at 12/01/17 0755  . magnesium hydroxide (MILK OF MAGNESIA) suspension 30 mL  30 mL Oral Daily PRN Patrecia Pour, NP      . nicotine (NICODERM CQ - dosed in mg/24 hours) patch 21 mg  21 mg Transdermal Daily Patrecia Pour, NP   21 mg at 12/01/17 0755  . ondansetron (ZOFRAN) tablet 4 mg  4 mg Oral Q8H PRN Patrecia Pour, NP      . pantoprazole (PROTONIX) EC tablet 40 mg  40 mg Oral Daily Patrecia Pour, NP   40 mg at 12/01/17 0755  . pravastatin (PRAVACHOL) tablet 20 mg  20 mg Oral Daily Patrecia Pour, NP   20 mg at 12/01/17 0755  . risperiDONE (RISPERDAL) tablet 0.25 mg  0.25 mg Oral Daily Sharma Covert, MD   0.25 mg at 12/01/17 0756  . risperiDONE (RISPERDAL) tablet 1 mg  1 mg Oral QHS Sharma Covert, MD   1 mg at 11/30/17 2046   . traZODone (DESYREL) tablet 50 mg  50 mg Oral QHS PRN Pennelope Bracken, MD   50 mg at 11/30/17 2045    PTA Medications: Medications Prior to Admission  Medication Sig Dispense Refill Last Dose  . acetaminophen (TYLENOL) 500 MG tablet Take 1,000 mg by mouth every 6 (six) hours as needed for mild pain.   11/23/2017 at Unknown time  . albuterol (PROVENTIL HFA;VENTOLIN HFA) 108 (90 Base) MCG/ACT inhaler Inhale 1-2 puffs into the lungs every 6 (six) hours as needed for wheezing or shortness of breath.    Past Month at Unknown time  . ALPRAZolam (XANAX) 0.25 MG tablet Take 0.0625 mg by mouth daily.   4 11/24/2017 at Unknown time  . buPROPion (WELLBUTRIN XL) 150 MG 24 hr tablet Take 300 mg by mouth daily.    11/23/2017 at Unknown time  . carbamazepine (TEGRETOL) 200 MG tablet Take 200 mg by mouth 2 (two) times daily.   Has not received it in mail yet  . estradiol (VIVELLE-DOT) 0.1 MG/24HR patch Place 0.1 mg onto the skin every 3 (three) days.    Past Week at Unknown time  . ibuprofen (ADVIL,MOTRIN) 200 MG tablet Take 800 mg by mouth every 6 (six) hours as  needed for moderate pain.   Past Week at Unknown time  . lamoTRIgine (LAMICTAL) 200 MG tablet Take 200 mg by mouth daily.    11/23/2017 at Unknown time  . omeprazole (PRILOSEC) 20 MG capsule Take 20 mg by mouth daily.   11/23/2017 at Unknown time  . pravastatin (PRAVACHOL) 20 MG tablet Take 20 mg by mouth daily.   11/23/2017 at Unknown time    Patient Stressors: Medication change or noncompliance Other: [place] mental illness  Patient Strengths: General fund of knowledge Physical Health Supportive family/friends  Treatment Modalities: Medication Management, Group therapy, Case management,  1 to 1 session with clinician, Psychoeducation, Recreational therapy.   Physician Treatment Plan for Primary Diagnosis: Bipolar I disorder, current or most recent episode manic, with psychotic features ([place]) Long Term Goal(s): Improvement in symptoms so  as ready for discharge  Short Term Goals: Ability to identify and develop effective coping behaviors will improve Ability to maintain clinical measurements within normal limits will improve  Medication Management: Evaluate patient's response, side effects, and tolerance of medication regimen.  Therapeutic Interventions: 1 to 1 sessions, Unit Group sessions and Medication administration.  Evaluation of Outcomes: Progressing  Physician Treatment Plan for Secondary Diagnosis: Principal Problem:   Bipolar I disorder, current or most recent episode manic, with psychotic features ([place])   Long Term Goal(s): Improvement in symptoms so as ready for discharge  Short Term Goals: Ability to identify and develop effective coping behaviors will improve Ability to maintain clinical measurements within normal limits will improve  Medication Management: Evaluate patient's response, side effects, and tolerance of medication regimen.  Therapeutic Interventions: 1 to 1 sessions, Unit Group sessions and Medication administration.  Evaluation of Outcomes: Progressing   RN Treatment Plan for Primary Diagnosis: Bipolar I disorder, current or most recent episode manic, with psychotic features ([place]) Long Term Goal(s): Knowledge of disease and therapeutic regimen to maintain health will improve  Short Term Goals: Ability to identify and develop effective coping behaviors will improve and Compliance with prescribed medications will improve  Medication Management: RN will administer medications as ordered by provider, will assess and evaluate patient's response and provide education to patient for prescribed medication. RN will report any adverse and/or side effects to prescribing provider.  Therapeutic Interventions: 1 on 1 counseling sessions, Psychoeducation, Medication administration, Evaluate responses to treatment, Monitor vital signs and CBGs as ordered, Perform/monitor CIWA, COWS, AIMS and Fall Risk  screenings as ordered, Perform wound care treatments as ordered.  Evaluation of Outcomes: Progressing   LCSW Treatment Plan for Primary Diagnosis: Bipolar I disorder, current or most recent episode manic, with psychotic features ([place]) Long Term Goal(s): Safe transition to appropriate next level of care at discharge, Engage patient in therapeutic group addressing interpersonal concerns.  Short Term Goals: Engage patient in aftercare planning with referrals and resources  Therapeutic Interventions: Assess for all discharge needs, 1 to 1 time with Social worker, Explore available resources and support systems, Assess for adequacy in community support network, Educate family and significant other(s) on suicide prevention, Complete Psychosocial Assessment, Interpersonal group therapy.  Evaluation of Outcomes: Met Return home, follow up current providers   Progress in Treatment: Attending groups: Yes Participating in groups: Yes Taking medication as prescribed: Yes Toleration medication: Yes, no side effects reported at this time Family/Significant other contact made: Yes Patient understands diagnosis: No Limited insight Discussing patient identified problems/goals with staff: Yes Medical problems stabilized or resolved: Yes Denies suicidal/homicidal ideation: Yes Issues/concerns per patient self-inventory: None Other: N/A  New  problem(s) identified: None identified at this time.   New Short Term/Long Term Goal(s): "I want to be beautiful and fun.  I want to go home and take care of things."  Discharge Plan or Barriers:   Reason for Continuation of Hospitalization: Disorganization Mania Paranoia Medication stabilization   Estimated Length of Stay: 1-3 days.  Attendees: Patient:  12/01/2017    Physician: Maris Berger, MD 12/01/2017    Nursing:  12/01/2017    RN Care Manager:  12/01/2017    Social Worker: Lurline Idol, LCSW 12/01/2017    Recreational Therapist:   12/01/2017    Other:  12/01/2017    Other:  12/01/2017         Scribe for Treatment Team:  Roque Lias LCSW 12/01/2017 11:29 AM

## 2017-12-01 NOTE — Progress Notes (Signed)
D: Pt denies SI/HI/AVH. Pt is pleasant and cooperative. Pt stated she was hopeful to go home soon. Pt stated she has learned a lot while being here and hopes to do better when she goes home.  A: Pt was offered support and encouragement. Pt was given scheduled medications. Pt was encourage to attend groups. Q 15 minute checks were done for safety.  R:Pt attends groups and interacts well with peers and staff. Pt is taking medication. Pt has no complaints.Pt receptive to treatment and safety maintained on unit.  Problem: Education: Goal: Emotional status will improve Outcome: Progressing   Problem: Education: Goal: Mental status will improve Outcome: Progressing   Problem: Activity: Goal: Sleeping patterns will improve Outcome: Progressing   Problem: Coping: Goal: Ability to demonstrate self-control will improve Outcome: Progressing
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
## 2017-12-01 NOTE — Plan of Care (Signed)
  Problem: Activity: Goal: Interest or engagement in activities will improve Outcome: Not Progressing   Problem: Safety: Goal: Ability to remain free from injury will improve Outcome: Progressing  DAR NOTE: Patient presents with anxious affect and depressed mood.  Denies suicidal thoughts, auditory and visual hallucinations.  Described energy level as normal and concentration as good.  Rates depression at 0, hopelessness at 0, and anxiety at 2.  Maintained on routine safety checks.  Medications given as prescribed.  Support and encouragement offered as needed.  Attended group and participated.  States goal for today is "getting to go home."  Patient remained in her room for majority of this shift.

## 2017-12-01 NOTE — Progress Notes (Signed)
Patient has been isolative to her room again tonight. She attended group and ate snack in the dayroom before returning to her room. Writer spoke with her 1: 1 and she reports that she is just is just ready to go back home. She was compliant with her medications. Safety maintained on unit with 15 min checks.
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
## 2017-12-02 MED ORDER — HYDROXYZINE HCL 25 MG PO TABS: 25.0000 mg | ORAL_TABLET | Freq: Three times a day (TID) | ORAL | 0 refills | Status: DC | PRN

## 2017-12-02 MED ORDER — CARBAMAZEPINE 200 MG PO TABS: 200.0000 mg | ORAL_TABLET | Freq: Two times a day (BID) | ORAL | 0 refills | Status: DC

## 2017-12-02 MED ORDER — TRAZODONE HCL 50 MG PO TABS: 50.0000 mg | ORAL_TABLET | Freq: Every evening | ORAL | 0 refills | Status: DC | PRN

## 2017-12-02 MED ORDER — RISPERIDONE 1 MG PO TABS: 1.5000 mg | ORAL_TABLET | Freq: Every day | ORAL | 0 refills | Status: DC

## 2017-12-02 MED ORDER — LAMOTRIGINE 200 MG PO TABS: 200.0000 mg | ORAL_TABLET | Freq: Every day | ORAL | 0 refills | Status: DC

## 2017-12-02 NOTE — Plan of Care (Signed)
Pt attended and participated in recreation therapy group sessions.   Kennard Fildes, LRT/CTRS 

## 2017-12-02 NOTE — Progress Notes (Signed)
Recreation Therapy Notes  INPATIENT RECREATION TR PLAN  Patient Details Name: Chloe [name] MRN: [id number] DOB: [birth year] Today's Date: 12/02/2017  Rec Therapy Plan Is patient appropriate for Therapeutic Recreation?: Yes Treatment times per week: about 3 days Estimated Length of Stay: 5-7 days TR Treatment/Interventions: Group participation (Comment)  Discharge Criteria Pt will be discharged from therapy if:: Discharged Treatment plan/goals/alternatives discussed and agreed upon by:: Patient/family  Discharge Summary Short term goals set: See patient care plan Short term goals met: Complete Progress toward goals comments: Groups attended Which groups?: Self-esteem, Wellness, Other (Comment)(Triggers, Team building) Reason goals not met: None Therapeutic equipment acquired: N/A Reason patient discharged from therapy: Discharge from hospital Pt/family agrees with progress & goals achieved: Yes Date patient discharged from therapy: 12/02/17    Victorino Sparrow, LRT/CTRS  Ria Comment, [place] 12/02/2017, 11:50 AM

## 2017-12-02 NOTE — Progress Notes (Signed)
Recreation Therapy Notes  Date: 10.15.19 Time: 1000 Location: 500 Hall Dayroom  Group Topic: Wellness  Goal Area(s) Addresses:  Patient will define components of whole wellness. Patient will verbalize benefit of whole wellness.  Behavioral Response: Engaged  Intervention: Music, Exercise  Activity: Exercise.  LRT led patients in a series of stretches to get them warmed up and loose.  Each patient would then lead the group in an exercise of their choice.  The group will complete 3 to 4 rounds of exercise.  Patients could take water breaks as needed.  Education: Wellness, Building control surveyor.   Education Outcome: Acknowledges education/In group clarification offered/Needs additional education.   Clinical Observations/Feedback: Pt was smiling and active during group.  Pt was fully engaged in each exercise.  Pt also explained that mental and spiritual wellbeing were also a part of wellness.  Pt was social with her peers and on task.    Caroll Rancher, LRT/CTRS     Caroll Rancher A 12/02/2017 11:24 AM
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
## 2017-12-02 NOTE — Progress Notes (Signed)
Patient discharged to lobby. Patient was stable and appreciative at that time. All papers and prescriptions were given and valuables returned. Verbal understanding expressed. Denies SI/HI and A/VH. Patient given opportunity to express concerns and ask questions.  

## 2017-12-02 NOTE — Progress Notes (Signed)
  [facility] Adult Case Management Discharge Plan :  Will you be returning to the same living situation after discharge:  Yes,  home At discharge, do you have transportation home?: Yes,  family Do you have the ability to pay for your medications: Yes,  insurance  Release of information consent forms completed and in the chart;  Patient's signature needed at discharge.  Patient to Follow up at: Follow-up Information    [facility] Follow up on 12/04/2017.   Specialty:  Behavioral Health Why:  Thursday at 11:00 with Dr Melba Coon information: [address] [place] [address] [phone]           Next level of care provider has access to [facility] Link:no  Safety Planning and Suicide Prevention discussed: Yes,  yes  Have you used any form of tobacco in the last 30 days? (Cigarettes, Smokeless Tobacco, Cigars, and/or Pipes): Yes  Has patient been referred to the Quitline?: Patient refused referral  Patient has been referred for addiction treatment: N/A  Ida Rogue, LCSW 12/02/2017, 8:58 AM
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
## 2018-01-29 ENCOUNTER — Other Ambulatory Visit: Payer: Self-pay

## 2018-01-29 ENCOUNTER — Emergency Department (HOSPITAL_COMMUNITY)
Admission: EM | Admit: 2018-01-29 | Discharge: 2018-01-29 | Disposition: A | Discharge status: Home / Self Care | Payer: BLUE CROSS/BLUE SHIELD | Attending: Emergency Medicine | Admitting: Emergency Medicine

## 2018-01-29 ENCOUNTER — Encounter (HOSPITAL_COMMUNITY): Payer: Self-pay | Admitting: Emergency Medicine

## 2018-01-29 DIAGNOSIS — F419 Anxiety disorder, unspecified: Secondary | ICD-10-CM | POA: Diagnosis not present

## 2018-01-29 DIAGNOSIS — F17210 Nicotine dependence, cigarettes, uncomplicated: Secondary | ICD-10-CM | POA: Insufficient documentation

## 2018-01-29 DIAGNOSIS — Z79899 Other long term (current) drug therapy: Secondary | ICD-10-CM | POA: Insufficient documentation

## 2018-01-29 DIAGNOSIS — Z7902 Long term (current) use of antithrombotics/antiplatelets: Secondary | ICD-10-CM | POA: Insufficient documentation

## 2018-01-29 NOTE — Discharge Instructions (Signed)
1.  Call your counselor today.  Try to be seen as soon as possible within the next 1 to 2 days. 2.  If you have any thoughts of hurting or killing yourself or injuring or killing anybody else, immediately return to the emergency department.

## 2018-01-29 NOTE — ED Notes (Signed)
Blood drawn and held in mini lab,

## 2018-01-29 NOTE — ED Provider Notes (Signed)
[facility] EMERGENCY DEPARTMENT Provider Note   CSN: [id number] Arrival date & time: 01/29/18  1141     History   Chief Complaint Chief Complaint  Patient presents with  . Medical Clearance    HPI Chloe [name] is a 52 y.o. female.  HPI Patient reports that she came to the emergency department because she wanted to go to a safe place.  When I saw the patient, she advised that she wanted to leave.  She reported she just thought she should be seen somewhere else.  She reports she came because she could not get a hold of her counselor today.  She seems frustrated and slightly hostile.  Triage note indicates patient stated she needed to get to a "safe place" because people are trying to "hack her phone".  Patient denied anybody with specific trying to hurt her but kept saying she just did not want to talk about it now.  She denied any suicidal or homicidal thoughts.  She reports once lifetimes ago she tried to hurt herself but that has not been something she thought of her wanted to do. Past Medical History:  Diagnosis Date  . Anxiety   . Bipolar disorder (HCC)   . Breast mass, right   . GERD (gastroesophageal reflux disease)   . Hyperlipidemia   . Post-operative nausea and vomiting   . Smoker     Patient Active Problem List   Diagnosis Date Noted  . Bipolar affective disorder, depressed, severe, with psychotic behavior (HCC) 11/24/2017  . Bipolar I disorder, current or most recent episode manic, with psychotic features (HCC) 11/24/2017  . Acute sciatica 07/18/2017  . Backache with radiation 07/18/2017    Past Surgical History:  Procedure Laterality Date  . ABDOMINAL HYSTERECTOMY     had nausea and vomiting post-op  . BREAST LUMPECTOMY WITH RADIOACTIVE SEED LOCALIZATION Right 11/12/2016   Procedure: RIGHT BREAST LUMPECTOMY WITH RADIOACTIVE SEED LOCALIZATION;  Surgeon: Harriette Bouillon, MD;  Location: [facility];  Service: General;   Laterality: Right;  . CHOLECYSTECTOMY    . LAPAROSCOPIC LYSIS OF ADHESIONS    . MYOMECTOMY    . PLANTAR FASCIA SURGERY Left      OB History   No obstetric history on file.      Home Medications    Prior to Admission medications   Medication Sig Start Date End Date Taking? Authorizing Provider  albuterol (PROVENTIL HFA;VENTOLIN HFA) 108 (90 Base) MCG/ACT inhaler Inhale 1-2 puffs into the lungs every 6 (six) hours as needed for wheezing or shortness of breath.     [provider]  carbamazepine (TEGRETOL) 200 MG tablet Take 1 tablet (200 mg total) by mouth 2 (two) times daily. 12/02/17   Micheal Likens, MD  estradiol (VIVELLE-DOT) 0.1 MG/24HR patch Place 0.1 mg onto the skin every 3 (three) days.  08/30/17   [provider]  hydrOXYzine (ATARAX/VISTARIL) 25 MG tablet Take 1 tablet (25 mg total) by mouth every 8 (eight) hours as needed for anxiety. 12/02/17   Micheal Likens, MD  lamoTRIgine (LAMICTAL) 200 MG tablet Take 1 tablet (200 mg total) by mouth daily. 12/02/17   Micheal Likens, MD  omeprazole (PRILOSEC) 20 MG capsule Take 20 mg by mouth daily.    [provider]  pravastatin (PRAVACHOL) 20 MG tablet Take 20 mg by mouth daily. 10/11/17   [provider]  risperiDONE (RISPERDAL) 1 MG tablet Take 1.5 tablets (1.5 mg total) by mouth at bedtime.  12/02/17   Micheal Likensainville, Christopher T, MD  traZODone (DESYREL) 50 MG tablet Take 1 tablet (50 mg total) by mouth at bedtime as needed for sleep. 12/02/17   Micheal Likensainville, Christopher T, MD    Family History Family History  Problem Relation Age of Onset  . Lung cancer Mother   . Hyperlipidemia Brother   . Colon cancer Neg Hx   . Esophageal cancer Neg Hx   . Stomach cancer Neg Hx     Social History Social History   Tobacco Use  . Smoking status: Current Every Day Smoker    Packs/day: 0.50    Types: Cigarettes  . Smokeless tobacco: Never Used  Substance Use Topics  . Alcohol  use: Not Currently    Comment: social  . Drug use: No     Allergies   Penicillins   Review of Systems Review of Systems 10 Systems reviewed and are negative for acute change except as noted in the HPI.   Physical Exam Updated Vital Signs BP (!) 144/86 (BP Location: Right Arm)   Pulse 87   Temp 98.3 F (36.8 C) (Oral)   Resp 16   Ht 5\' 2"  (1.575 m)   Wt 69 kg   SpO2 100%   BMI 27.82 kg/m   Physical Exam Constitutional:      Comments: Patient is alert and appropriate.  She does not show any signs of being confused.  She is not agitated in appearance.  HENT:     Head: Normocephalic and atraumatic.  Eyes:     Extraocular Movements: Extraocular movements intact.  Cardiovascular:     Rate and Rhythm: Normal rate and regular rhythm.  Pulmonary:     Effort: Pulmonary effort is normal.     Breath sounds: Normal breath sounds.  Abdominal:     General: There is no distension.     Palpations: Abdomen is soft.     Tenderness: There is no abdominal tenderness.  Musculoskeletal: Normal range of motion.        General: No deformity or signs of injury.  Skin:    General: Skin is warm and dry.  Neurological:     General: No focal deficit present.     Mental Status: She is oriented to person, place, and time.     Coordination: Coordination normal.  Psychiatric:     Comments: Patient does not show signs of confusion or responding to internal stimuli.  She is slightly irritated and now stating she just wants to go and does not want to talk about it but does not appear to be acutely psychotic.      ED Treatments / Results  Labs (all labs ordered are listed, but only abnormal results are displayed) Labs Reviewed - No data to display  EKG None  Radiology No results found.  Procedures Procedures (including critical care time)  Medications Ordered in ED Medications - No data to display   Initial Impression / Assessment and Plan / ED Course  I have reviewed the  triage vital signs and the nursing notes.  Pertinent labs & imaging results that were available during my care of the patient were reviewed by me and considered in my medical decision making (see chart for details).      Final Clinical Impressions(s) / ED Diagnoses   Final diagnoses:  Anxiety  Patient presents as outlined above.  She refuses to go into details about what brought her specifically to the emergency department and what her goals are.  She  does not show any signs of being acutely confused or psychotic.  She does not appear intoxicated.  Patient did not cite any suicidal or homicidal thoughts in triage and denies them to me.  At this time I do not suspect active suicidality.  Patient is discharged with instructions to follow-up closely with her counselor and return if she should have any concerns or develop thoughts of self-harm or harm of others.  ED Discharge Orders    None       Arby Barrette, MD 01/29/18 1318

## 2018-01-29 NOTE — ED Triage Notes (Signed)
Pt arrives to ED from home with complaints of needing a "safe place" because people are "hacking her phone". Pt reports she needs to get away from home. Pt denies any suicidal or homicidal thoughts. Pt placed in position of comfort with bed locked and lowered, call bell in reach.

## 2018-05-15 ENCOUNTER — Other Ambulatory Visit: Payer: Self-pay | Admitting: Nurse Practitioner

## 2019-02-03 ENCOUNTER — Ambulatory Visit (HOSPITAL_COMMUNITY)
Admission: EM | Admit: 2019-02-03 | Discharge: 2019-02-03 | Disposition: A | Discharge status: Home / Self Care | Payer: Self-pay | Attending: Urgent Care | Admitting: Urgent Care

## 2019-02-03 ENCOUNTER — Encounter (HOSPITAL_COMMUNITY): Payer: Self-pay

## 2019-02-03 ENCOUNTER — Other Ambulatory Visit: Payer: Self-pay

## 2019-02-03 DIAGNOSIS — Z885 Allergy status to narcotic agent status: Secondary | ICD-10-CM | POA: Insufficient documentation

## 2019-02-03 DIAGNOSIS — J309 Allergic rhinitis, unspecified: Secondary | ICD-10-CM | POA: Insufficient documentation

## 2019-02-03 DIAGNOSIS — R05 Cough: Secondary | ICD-10-CM | POA: Insufficient documentation

## 2019-02-03 DIAGNOSIS — Z20828 Contact with and (suspected) exposure to other viral communicable diseases: Secondary | ICD-10-CM | POA: Insufficient documentation

## 2019-02-03 DIAGNOSIS — F319 Bipolar disorder, unspecified: Secondary | ICD-10-CM | POA: Insufficient documentation

## 2019-02-03 DIAGNOSIS — H938X2 Other specified disorders of left ear: Secondary | ICD-10-CM | POA: Insufficient documentation

## 2019-02-03 DIAGNOSIS — E785 Hyperlipidemia, unspecified: Secondary | ICD-10-CM | POA: Insufficient documentation

## 2019-02-03 DIAGNOSIS — R062 Wheezing: Secondary | ICD-10-CM

## 2019-02-03 DIAGNOSIS — Z79899 Other long term (current) drug therapy: Secondary | ICD-10-CM | POA: Insufficient documentation

## 2019-02-03 DIAGNOSIS — J4540 Moderate persistent asthma, uncomplicated: Secondary | ICD-10-CM | POA: Insufficient documentation

## 2019-02-03 DIAGNOSIS — Z880 Allergy status to penicillin: Secondary | ICD-10-CM | POA: Insufficient documentation

## 2019-02-03 DIAGNOSIS — R0602 Shortness of breath: Secondary | ICD-10-CM | POA: Insufficient documentation

## 2019-02-03 DIAGNOSIS — K219 Gastro-esophageal reflux disease without esophagitis: Secondary | ICD-10-CM | POA: Insufficient documentation

## 2019-02-03 DIAGNOSIS — R0982 Postnasal drip: Secondary | ICD-10-CM | POA: Insufficient documentation

## 2019-02-03 DIAGNOSIS — J029 Acute pharyngitis, unspecified: Secondary | ICD-10-CM | POA: Insufficient documentation

## 2019-02-03 DIAGNOSIS — R059 Cough, unspecified: Secondary | ICD-10-CM

## 2019-02-03 DIAGNOSIS — Z90710 Acquired absence of both cervix and uterus: Secondary | ICD-10-CM | POA: Insufficient documentation

## 2019-02-03 DIAGNOSIS — Z8349 Family history of other endocrine, nutritional and metabolic diseases: Secondary | ICD-10-CM | POA: Insufficient documentation

## 2019-02-03 DIAGNOSIS — J3089 Other allergic rhinitis: Secondary | ICD-10-CM | POA: Insufficient documentation

## 2019-02-03 DIAGNOSIS — Z9011 Acquired absence of right breast and nipple: Secondary | ICD-10-CM | POA: Insufficient documentation

## 2019-02-03 DIAGNOSIS — Z801 Family history of malignant neoplasm of trachea, bronchus and lung: Secondary | ICD-10-CM | POA: Insufficient documentation

## 2019-02-03 DIAGNOSIS — F419 Anxiety disorder, unspecified: Secondary | ICD-10-CM | POA: Insufficient documentation

## 2019-02-03 DIAGNOSIS — Z9049 Acquired absence of other specified parts of digestive tract: Secondary | ICD-10-CM | POA: Insufficient documentation

## 2019-02-03 DIAGNOSIS — F17210 Nicotine dependence, cigarettes, uncomplicated: Secondary | ICD-10-CM | POA: Insufficient documentation

## 2019-02-03 MED ORDER — PREDNISONE 20 MG PO TABS: ORAL_TABLET | ORAL | 0 refills | Status: DC

## 2019-02-03 MED ORDER — CETIRIZINE HCL 10 MG PO TABS: 10.0000 mg | ORAL_TABLET | Freq: Every day | ORAL | 0 refills | Status: DC

## 2019-02-03 NOTE — ED Provider Notes (Signed)
[facility]   MRN: [id number] DOB: 01/20/1966  Subjective:   Chloe [name] is a 53 y.o. female presenting for 5-day history of mild to moderate progressive malaise including a productive cough, scratchy sore throat, left ear fullness.  Patient has been around some family members over the holidays and is not opposed to getting tested for COVID-19.  She is also a heavy smoker, admits that she has a chronic cough which is really unchanged.  Patient is requesting antibiotic course for an ear infection.  No current facility-administered medications for this encounter.  Current Outpatient Medications:  .  carbamazepine (TEGRETOL) 200 MG tablet, Take 1 tablet (200 mg total) by mouth 2 (two) times daily., Disp: 60 tablet, Rfl: 0 .  estradiol (VIVELLE-DOT) 0.1 MG/24HR patch, Place 0.1 mg onto the skin every 3 (three) days. , Disp: , Rfl:  .  hydrOXYzine (ATARAX/VISTARIL) 25 MG tablet, Take 1 tablet (25 mg total) by mouth every 8 (eight) hours as needed for anxiety., Disp: 30 tablet, Rfl: 0 .  lamoTRIgine (LAMICTAL) 200 MG tablet, Take 1 tablet (200 mg total) by mouth daily., Disp: 30 tablet, Rfl: 0 .  omeprazole (PRILOSEC) 20 MG capsule, Take 20 mg by mouth daily., Disp: , Rfl:  .  pravastatin (PRAVACHOL) 20 MG tablet, Take 20 mg by mouth daily., Disp: , Rfl:  .  PROAIR HFA 108 (90 Base) MCG/ACT inhaler, INHALE 2 PUFFS EVERY 4-6 HOURS AS NEEDED, Disp: 1 Inhaler, Rfl: 0 .  risperiDONE (RISPERDAL) 1 MG tablet, Take 1.5 tablets (1.5 mg total) by mouth at bedtime., Disp: 45 tablet, Rfl: 0 .  traZODone (DESYREL) 50 MG tablet, Take 1 tablet (50 mg total) by mouth at bedtime as needed for sleep., Disp: 30 tablet, Rfl: 0   Allergies  Allergen Reactions  . Penicillins Hives    Has patient had a PCN reaction causing immediate rash, facial/tongue/throat swelling, SOB or lightheadedness with hypotension: Yes Has patient had a PCN reaction causing severe rash involving mucus membranes or skin  necrosis: No Has patient had a PCN reaction that required hospitalization: No Has patient had a PCN reaction occurring within the last 10 years: Yes If all of the above answers are "NO", then may proceed with Cephalosporin use.  . Demerol [Meperidine]     Past Medical History:  Diagnosis Date  . Anxiety   . Bipolar disorder (HCC)   . Breast mass, right   . GERD (gastroesophageal reflux disease)   . Hyperlipidemia   . Post-operative nausea and vomiting   . Smoker      Past Surgical History:  Procedure Laterality Date  . ABDOMINAL HYSTERECTOMY     had nausea and vomiting post-op  . BREAST LUMPECTOMY WITH RADIOACTIVE SEED LOCALIZATION Right 11/12/2016   Procedure: RIGHT BREAST LUMPECTOMY WITH RADIOACTIVE SEED LOCALIZATION;  Surgeon: Harriette Bouillonornett, Thomas, MD;  Location: [facility];  Service: General;  Laterality: Right;  . CHOLECYSTECTOMY    . LAPAROSCOPIC LYSIS OF ADHESIONS    . MYOMECTOMY    . PLANTAR FASCIA SURGERY Left     Family History  Problem Relation Age of Onset  . Lung cancer Mother   . Hyperlipidemia Brother   . Colon cancer Neg Hx   . Esophageal cancer Neg Hx   . Stomach cancer Neg Hx     Social History   Tobacco Use  . Smoking status: Current Every Day Smoker    Packs/day: 0.50    Types: Cigarettes  . Smokeless tobacco: Never Used  Substance Use Topics  . Alcohol use: Not Currently    Comment: social  . Drug use: No    Review of Systems  Constitutional: Positive for malaise/fatigue. Negative for fever.  HENT: Positive for congestion and sore throat. Negative for ear discharge, ear pain, sinus pain and tinnitus.   Eyes: Negative for discharge and redness.  Respiratory: Positive for cough. Negative for hemoptysis, shortness of breath and wheezing.   Cardiovascular: Negative for chest pain.  Gastrointestinal: Negative for abdominal pain, diarrhea, nausea and vomiting.  Genitourinary: Negative for dysuria, flank pain and hematuria.    Musculoskeletal: Negative for myalgias.  Skin: Negative for rash.  Neurological: Negative for dizziness, weakness and headaches.  Psychiatric/Behavioral: Negative for depression and substance abuse.     Objective:   Vitals: BP 125/81 (BP Location: Right Arm)   Pulse 72   Temp 98.3 F (36.8 C) (Oral)   Resp 16   SpO2 96%   Physical Exam Constitutional:      General: She is not in acute distress.    Appearance: Normal appearance. She is well-developed. She is not ill-appearing, toxic-appearing or diaphoretic.  HENT:     Head: Normocephalic and atraumatic.     Right Ear: Ear canal normal. No drainage or tenderness. No middle ear effusion. Tympanic membrane is not erythematous.     Left Ear: Ear canal normal. No drainage or tenderness.  No middle ear effusion. Tympanic membrane is not erythematous.     Ears:     Comments: TMs opacified bilaterally.  No signs of erythema, no tenderness on exam.    Nose: Nose normal. No congestion or rhinorrhea.     Mouth/Throat:     Mouth: Mucous membranes are moist. No oral lesions.     Pharynx: No pharyngeal swelling, oropharyngeal exudate, posterior oropharyngeal erythema or uvula swelling.     Tonsils: No tonsillar exudate or tonsillar abscesses.     Comments: Thick streaks of postnasal drainage in oropharynx. Eyes:     Extraocular Movements: Extraocular movements intact.     Right eye: Normal extraocular motion.     Left eye: Normal extraocular motion.     Conjunctiva/sclera: Conjunctivae normal.     Pupils: Pupils are equal, round, and reactive to light.  Cardiovascular:     Rate and Rhythm: Normal rate and regular rhythm.     Pulses: Normal pulses.     Heart sounds: Normal heart sounds. No murmur. No friction rub. No gallop.   Pulmonary:     Effort: Pulmonary effort is normal. No respiratory distress.     Breath sounds: Normal breath sounds. No stridor. No wheezing, rhonchi or rales.  Musculoskeletal:     Cervical back: Normal range  of motion and neck supple.  Lymphadenopathy:     Cervical: No cervical adenopathy.  Skin:    General: Skin is warm and dry.     Findings: No rash.  Neurological:     General: No focal deficit present.     Mental Status: She is alert and oriented to person, place, and time.  Psychiatric:        Mood and Affect: Mood normal.        Behavior: Behavior normal.        Thought Content: Thought content normal.      Assessment and Plan :   1. Ear fullness, left   2. Post-nasal drainage   3. Cough   4. Shortness of breath   5. Wheezing   6. Sore throat  7. Allergic rhinitis due to other allergic trigger, unspecified seasonality   8. Moderate persistent asthma without complication    Will use oral steroid course given her history of asthma, rhinitis that is not controlled due to not taking maintenance medicines and ongoing smoking.  Physical exam findings reassuring against need for antibiotic.  Recommend supportive care otherwise.  COVID-19 testing pending. Counseled patient on potential for adverse effects with medications prescribed/recommended today, ER and return-to-clinic precautions discussed, patient verbalized understanding.   Wallis Bamberg, PA-C 02/05/19 1246

## 2019-02-03 NOTE — ED Triage Notes (Signed)
Pt presents with productive cough, sore throat, and left ear fullness X 5 days.  Pt is a heavy smoker but she states she has been around family members that were sick of the holidays.

## 2019-02-05 LAB — NOVEL CORONAVIRUS, NAA (HOSP ORDER, SEND-OUT TO REF LAB; TAT 18-24 HRS): SARS-CoV-2, NAA: NOT DETECTED

## 2019-06-03 ENCOUNTER — Ambulatory Visit: Payer: Self-pay | Attending: Internal Medicine

## 2019-06-03 DIAGNOSIS — Z23 Encounter for immunization: Secondary | ICD-10-CM

## 2019-06-03 NOTE — Progress Notes (Signed)
   Covid-19 Vaccination Clinic  Name:  Chloe [name]    MRN: [id number] DOB: [birth year]  06/03/2019  Ms. Chloe [name] was observed post Covid-19 immunization for 15 minutes without incident. She was provided with Vaccine Information Sheet and instruction to access the V-Safe system.   Ms. Chloe [name] was instructed to call 911 with any severe reactions post vaccine: Marland Kitchen Difficulty breathing  . Swelling of face and throat  . A fast heartbeat  . A bad rash all over body  . Dizziness and weakness   Immunizations Administered    Name Date Dose VIS Date Route   Pfizer COVID-19 Vaccine 06/03/2019  8:32 AM 0.3 mL 01/29/2019 Intramuscular   Manufacturer: [employer], [employer]   Lot: [id number]   NDC: [address]-2

## 2019-06-04 ENCOUNTER — Ambulatory Visit: Payer: Self-pay

## 2019-06-28 ENCOUNTER — Ambulatory Visit: Payer: Self-pay | Attending: Internal Medicine

## 2019-06-28 DIAGNOSIS — Z23 Encounter for immunization: Secondary | ICD-10-CM

## 2019-06-28 NOTE — Progress Notes (Signed)
   Covid-19 Vaccination Clinic  Name:  Chloe [name]    MRN: [id number] DOB: 02-03-1966  06/28/2019  Chloe [name] was observed post Covid-19 immunization for 15 minutes without incident. She was provided with Vaccine Information Sheet and instruction to access the V-Safe system.   Chloe [name] was instructed to call 911 with any severe reactions post vaccine: Marland Kitchen Difficulty breathing  . Swelling of face and throat  . A fast heartbeat  . A bad rash all over body  . Dizziness and weakness   Immunizations Administered    Name Date Dose VIS Date Route   Pfizer COVID-19 Vaccine 06/28/2019  3:14 PM 0.3 mL 04/14/2018 Intramuscular   Manufacturer: [employer], [employer]   Lot: [id number]   NDC: [address]-2

## 2020-03-16 ENCOUNTER — Encounter (HOSPITAL_COMMUNITY): Payer: Self-pay | Admitting: Behavioral Health

## 2020-03-16 ENCOUNTER — Ambulatory Visit (HOSPITAL_COMMUNITY)
Admission: EM | Admit: 2020-03-16 | Discharge: 2020-03-16 | Disposition: A | Discharge status: Home / Self Care | Payer: No Payment, Other | Attending: Psychiatry | Admitting: Psychiatry

## 2020-03-16 ENCOUNTER — Other Ambulatory Visit: Payer: Self-pay

## 2020-03-16 DIAGNOSIS — Z79899 Other long term (current) drug therapy: Secondary | ICD-10-CM | POA: Insufficient documentation

## 2020-03-16 DIAGNOSIS — F319 Bipolar disorder, unspecified: Secondary | ICD-10-CM

## 2020-03-16 DIAGNOSIS — F3170 Bipolar disorder, currently in remission, most recent episode unspecified: Secondary | ICD-10-CM | POA: Insufficient documentation

## 2020-03-16 MED ORDER — LAMOTRIGINE 200 MG PO TABS: 200.0000 mg | ORAL_TABLET | Freq: Two times a day (BID) | ORAL | 0 refills | Status: DC

## 2020-03-16 MED ORDER — LAMOTRIGINE 100 MG PO TABS
200.0000 mg | ORAL_TABLET | Freq: Two times a day (BID) | ORAL | Status: DC
  Filled 2020-03-16: qty 28

## 2020-03-16 NOTE — ED Notes (Signed)
Patient A&O x 4, ambulatory. Patient discharged in no acute distress. Patient denied SI/HI, A/VH upon discharge. Patient verbalized understanding of all discharge instructions explained by staff, to include follow up appointments recommendations and safety plan. Patient escorted to lobby via staff for transport to destination. Safety maintained.

## 2020-03-16 NOTE — ED Provider Notes (Signed)
[facility] Medical Screening Exam  Patient Name: Chloe [name] MRN: [id number] Date of Evaluation: 03/16/20 Chief Complaint:   Diagnosis:  Final diagnoses:  Bipolar affective disorder, remission status unspecified (HCC)    History of Present illness: Chloe [name] is a 55 y.o. female who presents to the [facility] to establish care. She states that her psychiatrist is no longer able to see her and it was recommended she come to the [facility] for medication management moving forward. She states that she has a a dx of bipolar disorder and that she takes lamictal 200 mg BID and tegretol but is currently tapering her tegretol over 2 days at the direction of her psychiatrist in order to simplify her medications. She states that her last manic episode was in 2019 in the context of being off of her medications, drinking and traveling out of the country. She states that she has been compliant with her medications since then and has had no issues. She reports her mood as "ok" and reports sleeping "good". She states that she has some stressors with unemployment and seperatopm/divorce from her husband  but describes coping well. She has a therapist that she sees anywhere from q2 weeks to [place] dependent upon her stressors. She has been with the same psychiatrist and therapist for >10 years; she states that her therapist has agreed to keep seeing her free of charge. She denies SI/HI/AVH and requests information about orange card and applying for disability and medicaid. TTS assisted with this information. Patient was provided with information about open access and requests 7 day sample of lamictal.   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   Psychiatric Specialty Exam  Presentation  General Appearance:Appropriate for Environment; Casual  Eye Contact:Fair  Speech:Clear and Coherent; Normal Rate  Speech Volume:Normal  Handedness:No data recorded  Mood and Affect  Mood:Euthymic  Affect:Appropriate; Congruent   Thought Process  Thought Processes:Coherent; Goal Directed; Linear  Descriptions of Associations:Intact  Orientation:Full (Time, Place and Person)  Thought Content:WDL  Hallucinations:None  Ideas of Reference:None  Suicidal Thoughts:No  Homicidal Thoughts:No   Sensorium  Memory:Immediate Good; Recent Good; Remote Good  Judgment:Good  Insight:Good   Executive Functions  Concentration:Good  Attention Span:Good  Recall:Good  Fund of Knowledge:Good  Language:Good   Psychomotor Activity  Psychomotor Activity:Normal   Assets  Assets:Communication Skills; Desire for Improvement; Housing; Resilience   Sleep  Sleep:Good  Number of hours: No data recorded  Physical Exam: Physical Exam Constitutional:      Appearance: Normal appearance. She is normal weight.  HENT:     Head: Normocephalic and atraumatic.  Eyes:     Extraocular Movements: Extraocular movements intact.  Pulmonary:     Effort: Pulmonary effort is normal.  Neurological:     Mental Status: She is alert.    Review of Systems  Psychiatric/Behavioral: Negative for depression,  substance abuse and suicidal ideas.   Blood pressure (!) 143/103, pulse 74, temperature 98.8 F (37.1 C), temperature source Oral, resp. rate 18, height 5\' 1"  (1.549 m), weight 63 kg, SpO2 97 %. Body mass index is 26.26 kg/m.  Musculoskeletal: Strength & Muscle Tone: within normal limits Gait & Station: normal Patient leans: N/A   [facility] MSE Discharge Disposition for Follow up and Recommendations: Based on my evaluation the patient does not appear to have an emergency medical condition and can be discharged with resources and follow up care in outpatient services for Medication Management   Provided 7 day sample for lamictal 200 mg BID and recommended patient present to open access hours at [facility]  Please come to [facility] (this facility) during walk in hours for appointment with psychiatrist for further medication management and for therapy.   Walk in hours are 8-11 AM Monday through Thursday for medication management.It is first come, first -serve; it is best to arrive by 7:30 AM. On Friday from 1 pm to 4 pm for therapy intake only. Please arrive by 12:00 pm as it is  first come, first -serve.   When you arrive please go upstairs for your appointment. If you are unsure of where to go, inform the front desk that you are here for a walk in appointment and they will assist you with directions upstairs.  Address:  [address] in [place], [place] Ph: [phone]    [phone], MD 03/16/2020, 2:45 PM

## 2020-03-16 NOTE — Discharge Instructions (Signed)
Please come to [facility] (this facility) during walk in hours for appointment with psychiatrist for further medication management and for therapy.   Walk in hours are 8-11 AM Monday through Thursday for medication management.It is first come, first -serve; it is best to arrive by 7:30 AM. On Friday from 1 pm to 4 pm for therapy intake only. Please arrive by 12:00 pm as it is  first come, first -serve.   When you arrive please go upstairs for your appointment. If you are unsure of where to go, inform the front desk that you are here for a walk in appointment and they will assist you with directions upstairs.  Address:  [address], in [place], [address] Ph: [phone]   

## 2020-03-16 NOTE — ED Triage Notes (Signed)
Pt presents as walk-in to [facility]. Pt states she decided to come here after her psychiatrist gave her the phone #. Pt states she is "schizophrenic, has mental illness, and had a bad episode" recently. Pt states she "wants help with healthcare, medications, and whatever else it is you guy do here." Pt denies SI, HI, and AVH.

## 2020-04-12 ENCOUNTER — Ambulatory Visit (INDEPENDENT_AMBULATORY_CARE_PROVIDER_SITE_OTHER): Payer: No Payment, Other | Admitting: Psychiatry

## 2020-04-12 ENCOUNTER — Other Ambulatory Visit: Payer: Self-pay | Admitting: Psychiatry

## 2020-04-12 ENCOUNTER — Encounter (HOSPITAL_COMMUNITY): Payer: Self-pay | Admitting: Psychiatry

## 2020-04-12 ENCOUNTER — Other Ambulatory Visit: Payer: Self-pay

## 2020-04-12 VITALS — BP 138/81 | HR 75 | Ht 61.0 in | Wt 130.6 lb

## 2020-04-12 DIAGNOSIS — F319 Bipolar disorder, unspecified: Secondary | ICD-10-CM | POA: Diagnosis not present

## 2020-04-12 DIAGNOSIS — Z599 Problem related to housing and economic circumstances, unspecified: Secondary | ICD-10-CM | POA: Insufficient documentation

## 2020-04-12 MED ORDER — LAMOTRIGINE 200 MG PO TABS: 200.0000 mg | ORAL_TABLET | Freq: Two times a day (BID) | ORAL | 1 refills | Status: DC

## 2020-04-12 MED FILL — lamoTRIgine 200 MG TABS: 200 | 30 days supply | Qty: 60 | Fill #0

## 2020-04-12 NOTE — Progress Notes (Signed)
Psychiatric Initial Adult Assessment   Patient Identification: Chloe [name] MRN:  [id number] Date of Evaluation:  04/12/2020   [facility]  Chief Complaint:  " I need my medicines."  Visit Diagnosis:    ICD-10-CM   1. Bipolar 1 disorder (HCC)  F31.9 lamoTRIgine (LAMICTAL) 200 MG tablet  2. Financial difficulties  Z59.9     History of Present Illness: This is a 55 year old female with past history of bipolar disorder and several psychiatric hospitalizations now seen for establishing care. Patient informed that she was seeing Dr. Donell BeersPlovsky at [facility] however after she lost her job a few months ago she is not able to afford the psychiatry clinic fees. She informed that she has also been seeing a therapist in the same office and due to lack of insurance she will not be able to see them again. She found that she had been seeing her psychiatrist and therapist for like almost 20 years now. [facility] where she had presented with request for refills on Lamictal. She was given 2-week supply and was advised to show me as a walk-in here.  Today, patient reported that she has been very stressed because of lack of insurance and having financial issues. She stated that she lives by herself with her dog and doesn't have any other support. She was a little irritable when the writer asked of her questions. She stated that she does not want elaborate about the reasons why she left her job. Regarding her medications, patient informed that she has been taking Lamictal 200 mg twice daily and also Tegretol 200 mg twice daily. She became slightly irritable by the writer clarified her medications and stated  that she came here very early and is really hungry as she hasn't had her breakfast yet.  Upon clarification, patient stated that she had discussed with Dr. Donell BeersPlovsky about discontinuing Tegretol  and just doing monotherapy with Lamictal because it has helped her mood immensely. She stated that Tegretol has been quite expensive for her to afford. Writer also explained to the patient that it would be advisable to discontinue Tegretol because it induces the metabolism of Lamictal making the Lamictal less efficacious.  She denied any auditory or visual hallucinations. She denied any paranoid delusions. When writer asked her questions she stated that she is fine and no needs to worry about her.  Writer asked her if she has been having any suicidal ideations, she became slightly tearful and stated that though she has not had any of those thoughts. She denied having access to any lethal weapons at home. She stated that she just wants to straighten out her medications at medical insurance stuff. She stated that she has a dog to care for.  She stated that she is trying to figure out her insurance. She asked the writer questions about where she should go for disability and things like that. She stated that she has received disability in the past once around 20 or 30 years ago.  Based on patient's assessment, writer recommended discontinue Tegretol continue Lamictal. Writer recommended [facility] for affordability issues. She was agreeable with this plan. Writer asked if she would like to be connected to a different therapist at this clinic and she declined that offer.  Past Psychiatric History: Bipolar disorder, several psychiatric hospitalizations in the past. She was last admitted 25 November 2017 and was noted to have psychotic symptoms along with manic features during that hospital stay. She was discharged on risperidone, carbamazepine and trazodone at that time.  Previous Psychotropic Medications: Yes  -Risperdal, Depakote, trazodone, carbamazepine, Lamictal   Substance Abuse History in the last 12 months:  No.  Consequences of  Substance Abuse: NA  Past Medical History:  Past Medical History:  Diagnosis Date  . Anxiety   . Bipolar disorder (HCC)   . Breast mass, right   . GERD (gastroesophageal reflux disease)   . Hyperlipidemia   . Post-operative nausea and vomiting   . Smoker     Past Surgical History:  Procedure Laterality Date  . ABDOMINAL HYSTERECTOMY     had nausea and vomiting post-op  . BREAST LUMPECTOMY WITH RADIOACTIVE SEED LOCALIZATION Right 11/12/2016   Procedure: RIGHT BREAST LUMPECTOMY WITH RADIOACTIVE SEED LOCALIZATION;  Surgeon: Harriette Bouillon, MD;  Location: [facility];  Service: General;  Laterality: Right;  . CHOLECYSTECTOMY    . LAPAROSCOPIC LYSIS OF ADHESIONS    . MYOMECTOMY    . PLANTAR FASCIA SURGERY Left     Family Psychiatric History: refused to answer  Family History:  Family History  Problem Relation Age of Onset  . Lung cancer Mother   . Hyperlipidemia Brother   . Colon cancer Neg Hx   . Esophageal cancer Neg Hx   . Stomach cancer Neg Hx     Social History:   Social History   Socioeconomic History  . Marital status: Divorced    Spouse name: Not on file  . Number of children: Not on file  . Years of education: Not on file  . Highest education level: Not on file  Occupational History  . Not on file  Tobacco Use  . Smoking status: Current Some Day Smoker    Packs/day: 0.50    Types: Cigarettes  . Smokeless tobacco: Never Used  Vaping Use  . Vaping Use: Never used  Substance and Sexual Activity  . Alcohol use: Not Currently    Comment: social  . Drug use: No  . Sexual activity: Not Currently  Other Topics Concern  . Not on file  Social History Narrative  . Not on file   Social Determinants of Health   Financial Resource Strain: Not on file  Food Insecurity: Not on file  Transportation Needs: Not on file  Physical Activity: Not on file  Stress: Not on file  Social Connections: Not on file    Additional Social History:  Unemployed, lives by herself, no children  Allergies:   Allergies  Allergen Reactions  . Penicillins Hives    Has patient had a PCN reaction causing immediate rash, facial/tongue/throat swelling, SOB or lightheadedness with hypotension: Yes Has patient had a PCN reaction causing severe rash involving mucus membranes or skin necrosis: No Has patient had a PCN reaction that required hospitalization: No Has patient had a PCN reaction occurring within the last 10 years: Yes If all of the above answers are "NO", then may proceed with Cephalosporin use.  . Demerol [Meperidine]     Metabolic Disorder Labs: Lab Results  Component Value Date   HGBA1C 5.2 11/30/2017   MPG 102.54 11/30/2017   Lab Results  Component Value Date   PROLACTIN 12.4 11/30/2017   Lab Results  Component Value Date   CHOL 166 11/30/2017   TRIG 91 11/30/2017   HDL 48 11/30/2017   CHOLHDL 3.5 11/30/2017   VLDL 18 11/30/2017  LDLCALC 100 (H) 11/30/2017   Lab Results  Component Value Date   TSH 1.717 11/30/2017    Therapeutic Level Labs: No results found for: LITHIUM Lab Results  Component Value Date   CBMZ 8.3 11/30/2017   No results found for: VALPROATE  Current Medications: Current Outpatient Medications  Medication Sig Dispense Refill  . cetirizine (ZYRTEC ALLERGY) 10 MG tablet Take 1 tablet (10 mg total) by mouth daily. 90 tablet 0  . estradiol (VIVELLE-DOT) 0.1 MG/24HR patch Place 0.1 mg onto the skin every 3 (three) days.     Marland Kitchen omeprazole (PRILOSEC) 20 MG capsule Take 20 mg by mouth daily.    . pravastatin (PRAVACHOL) 20 MG tablet Take 20 mg by mouth daily.    . predniSONE (DELTASONE) 20 MG tablet Take 2 tablets daily with breakfast. 10 tablet 0  . PROAIR HFA 108 (90 Base) MCG/ACT inhaler INHALE 2 PUFFS EVERY 4-6 HOURS AS NEEDED 1 Inhaler 0  . lamoTRIgine (LAMICTAL) 200 MG tablet Take 1 tablet (200 mg total) by mouth 2 (two) times daily. 60 tablet 1   No current facility-administered  medications for this visit.    Musculoskeletal: Strength & Muscle Tone: within normal limits Gait & Station: normal Patient leans: N/A  Psychiatric Specialty Exam: Review of Systems  Blood pressure 138/81, pulse 75, height 5\' 1"  (1.549 m), weight 130 lb 9.6 oz (59.2 kg), SpO2 98 %.Body mass index is 24.68 kg/m.  General Appearance: Fairly Groomed, somewhat guarded  Eye Contact:  Fair  Speech:  Clear and Coherent and Normal Rate  Volume:  Normal  Mood:  Irritable  Affect:  Labile  Thought Process:  Goal Directed and Descriptions of Associations: Intact  Orientation:  Full (Time, Place, and Person)  Thought Content:  Logical and Denied any hallucinations or delusions today, fixated on medication prescriiption  Suicidal Thoughts:  No  Homicidal Thoughts:  No  Memory:  Immediate;   Good Recent;   Good  Judgement:  Fair  Insight:  Fair  Psychomotor Activity:  Normal  Concentration:  Concentration: Good and Attention Span: Good  Recall:  Good  Fund of Knowledge:Good  Language: Good  Akathisia:  No  Handed:  Right  AIMS (if indicated):  0  Assets:  Communication Skills Desire for Improvement Housing Talents/Skills  ADL's:  Intact  Cognition: WNL  Sleep:  Fair   Screenings: AUDIT   Flowsheet Row [facility]  Alcohol Use Disorder Identification Test Final Score (AUDIT) 0    PHQ2-9   Flowsheet Row Clinical Support from 04/12/2020 in [facility] ED from 03/16/2020 in [facility]  PHQ-2 Total Score 2 2  PHQ-9 Total Score 8 7    Flowsheet Row Clinical Support from 04/12/2020 in [facility] from 03/16/2020 in [facility] Ad[facility] (Discharged[facility]  C-SSRS RISK CATEGORY No Risk No Risk No Risk      Assessment and Plan:  55 year old female with long history of bipolar disorder and several psychiatric hospitalizations in the past now seen for establishing care. She was being seen by an established psychiatrist for around 20 years however she lost her insurance recently and is having some financial issues. She seems to be somewhat irritable and labile due to her ongoing financial difficulties. She plans to contact the Social Security office to get her disability insurance started.  1. Bipolar  1 disorder (HCC)  - Continue lamoTRIgine (LAMICTAL) 200 MG tablet; Take 1 tablet (200 mg total) by mouth 2 (two) times daily.  Dispense: 60 tablet; Refill: 1 - Discontinue Tegretol due to lack of affordability and due to drug-drug interaction.  2. Financial difficulties -Patient was provided the [facility] of [facility] where she can get her medication at a cheaper price. She was also provided informed about the Social Security office which is located not too far away from this clinic.   Patient was offered to be connected to a therapist in this clinic, however patient declined that offer. Follow-up in 6 weeks.   Zena Amos, MD 2/23/20229:07 AM

## 2020-05-17 MED FILL — lamoTRIgine 200 MG TABS: 200 | 30 days supply | Qty: 60 | Fill #0

## 2020-06-12 ENCOUNTER — Ambulatory Visit (INDEPENDENT_AMBULATORY_CARE_PROVIDER_SITE_OTHER): Payer: No Payment, Other | Admitting: Psychiatry

## 2020-06-12 ENCOUNTER — Encounter (HOSPITAL_COMMUNITY): Payer: Self-pay | Admitting: Psychiatry

## 2020-06-12 ENCOUNTER — Other Ambulatory Visit: Payer: Self-pay

## 2020-06-12 DIAGNOSIS — F319 Bipolar disorder, unspecified: Secondary | ICD-10-CM | POA: Diagnosis not present

## 2020-06-12 MED ORDER — LAMOTRIGINE 200 MG PO TABS
ORAL_TABLET | Freq: Two times a day (BID) | ORAL | 1 refills | Status: DC
  Filled 2020-06-12 – 2020-06-29 (×2): qty 60, 30d supply, fill #0
  Filled 2020-07-26: qty 60, 30d supply, fill #1

## 2020-06-12 NOTE — Progress Notes (Signed)
BH OP Progress Note   Patient Identification: Chloe [name] MRN:  [id number] Date of Evaluation:  06/12/2020    Chief Complaint:  " I am calmer now."  Visit Diagnosis:    ICD-10-CM   1. Bipolar 1 disorder (HCC)  F31.9 lamoTRIgine (LAMICTAL) 200 MG tablet    History of Present Illness: Patient reported she is doing better than last time.  She informed that last time when she came to this clinic she was quite irritated because she had a lot of stuff going on.  She stated that for the past week or so she has been much calmer and she feels much better. She stated that she is still dealing with a lot of financial challenges.  She recently had to lose her car and then phone because of lack of affordability.  She informed that her brother who lives about 40 miles away from here tends to help her as much as he can however he has his own family and other responsibilities to take care of. She stated that right now she is relying on food stamps and the local food bank on [address] to meet her basic needs for food.  However she does need financial help from others to help with other groceries like toiletries and things like that. She stated that a few weeks ago she was very irritated and she took out her anger on some furniture in her house however she realized that that was not really a good idea. She spoke about how she misses going to work because when she was working she was able to afford and manage everything on her own but now she has to rely on others. She has applied for disability and she is waiting to hear from them.  She stated that she has done everything in her power to get help. She stated that she thinks that she may consider going back to work on a part-time basis so that she can make her ends meet while she is waiting to hear back from disability. She denied any other issues or concerns today. She spoke about all her health conditions that she needs to address with her primary care  provider at the community health clinic at the time of her next visit with them.  She requested to be connected with our clinic care coordinator Ms. Ava for more local resources.  Writer offered her to be connected with a therapist but she declined once again.  Past Psychiatric History: Bipolar disorder, several psychiatric hospitalizations in the past. She was last admitted 25 November 2017 and was noted to have psychotic symptoms along with manic features during that hospital stay. She was discharged on risperidone, carbamazepine and trazodone at that time.  Previous Psychotropic Medications: Yes  -Risperdal, Depakote, trazodone, carbamazepine, Lamictal   Substance Abuse History in the last 12 months:  No.  Consequences of Substance Abuse: NA  Past Medical History:  Past Medical History:  Diagnosis Date  . Anxiety   . Bipolar disorder (HCC)   . Breast mass, right   . GERD (gastroesophageal reflux disease)   . Hyperlipidemia   . Post-operative nausea and vomiting   . Smoker     Past Surgical History:  Procedure Laterality Date  . ABDOMINAL HYSTERECTOMY     had nausea and vomiting post-op  . BREAST LUMPECTOMY WITH RADIOACTIVE SEED LOCALIZATION Right 11/12/2016   Procedure: RIGHT BREAST LUMPECTOMY WITH RADIOACTIVE SEED LOCALIZATION;  Surgeon: Harriette Bouillon, MD;  Location: [facility];  Service: General;  Laterality: Right;  . CHOLECYSTECTOMY    . LAPAROSCOPIC LYSIS OF ADHESIONS    . MYOMECTOMY    . PLANTAR FASCIA SURGERY Left     Family Psychiatric History: refused to answer  Family History:  Family History  Problem Relation Age of Onset  . Lung cancer Mother   . Hyperlipidemia Brother   . Colon cancer Neg Hx   . Esophageal cancer Neg Hx   . Stomach cancer Neg Hx     Social History:   Social History   Socioeconomic History  . Marital status: Divorced    Spouse name: Not on file  . Number of children: Not on file  . Years of education: Not on file   . Highest education level: Not on file  Occupational History  . Not on file  Tobacco Use  . Smoking status: Current Some Day Smoker    Packs/day: 0.50    Types: Cigarettes  . Smokeless tobacco: Never Used  Vaping Use  . Vaping Use: Never used  Substance and Sexual Activity  . Alcohol use: Not Currently    Comment: social  . Drug use: No  . Sexual activity: Not Currently  Other Topics Concern  . Not on file  Social History Narrative  . Not on file   Social Determinants of Health   Financial Resource Strain: Not on file  Food Insecurity: Not on file  Transportation Needs: Not on file  Physical Activity: Not on file  Stress: Not on file  Social Connections: Not on file    Additional Social History: Unemployed, lives by herself, no children  Allergies:   Allergies  Allergen Reactions  . Penicillins Hives    Has patient had a PCN reaction causing immediate rash, facial/tongue/throat swelling, SOB or lightheadedness with hypotension: Yes Has patient had a PCN reaction causing severe rash involving mucus membranes or skin necrosis: No Has patient had a PCN reaction that required hospitalization: No Has patient had a PCN reaction occurring within the last 10 years: Yes If all of the above answers are "NO", then may proceed with Cephalosporin use.  . Demerol [Meperidine]     Metabolic Disorder Labs: Lab Results  Component Value Date   HGBA1C 5.2 11/30/2017   MPG 102.54 11/30/2017   Lab Results  Component Value Date   PROLACTIN 12.4 11/30/2017   Lab Results  Component Value Date   CHOL 166 11/30/2017   TRIG 91 11/30/2017   HDL 48 11/30/2017   CHOLHDL 3.5 11/30/2017   VLDL 18 11/30/2017   LDLCALC 100 (H) 11/30/2017   Lab Results  Component Value Date   TSH 1.717 11/30/2017    Therapeutic Level Labs: No results found for: LITHIUM Lab Results  Component Value Date   CBMZ 8.3 11/30/2017   No results found for: VALPROATE  Current Medications: Current  Outpatient Medications  Medication Sig Dispense Refill  . cetirizine (ZYRTEC ALLERGY) 10 MG tablet Take 1 tablet (10 mg total) by mouth daily. 90 tablet 0  . estradiol (VIVELLE-DOT) 0.1 MG/24HR patch Place 0.1 mg onto the skin every 3 (three) days.     Marland Kitchen lamoTRIgine (LAMICTAL) 200 MG tablet TAKE 1 TABLET (200 MG TOTAL) BY MOUTH 2 (TWO) TIMES DAILY. 60 tablet 1  . omeprazole (PRILOSEC) 20 MG capsule Take 20 mg by mouth daily.    . pravastatin (PRAVACHOL) 20 MG tablet Take 20 mg by mouth daily.    . predniSONE (DELTASONE) 20 MG tablet Take 2 tablets daily with  breakfast. 10 tablet 0  . PROAIR HFA 108 (90 Base) MCG/ACT inhaler INHALE 2 PUFFS EVERY 4-6 HOURS AS NEEDED 1 Inhaler 0   No current facility-administered medications for this visit.    Musculoskeletal: Strength & Muscle Tone: within normal limits Gait & Station: normal Patient leans: N/A  Psychiatric Specialty Exam: Review of Systems  Blood pressure (!) 142/91, pulse 84, height 5\' 1"  (1.549 m), weight 127 lb (57.6 kg), SpO2 97 %.Body mass index is 24 kg/m.  General Appearance: Fairly Groomed  Eye Contact:  Good  Speech:  Clear and Coherent and Normal Rate  Volume:  Normal  Mood:  Euthymic  Affect:  Congruent  Thought Process:  Goal Directed and Descriptions of Associations: Intact  Orientation:  Full (Time, Place, and Person)  Thought Content:  Logical and Rumination  Suicidal Thoughts:  No  Homicidal Thoughts:  No  Memory:  Immediate;   Good Recent;   Good  Judgement:  Fair  Insight:  Fair  Psychomotor Activity:  Normal  Concentration:  Concentration: Good and Attention Span: Good  Recall:  Good  Fund of Knowledge:Good  Language: Good  Akathisia:  No  Handed:  Right  AIMS (if indicated):  0  Assets:  Communication Skills Desire for Improvement Housing Talents/Skills  ADL's:  Intact  Cognition: WNL  Sleep:  Fair   Screenings: AUDIT   Flowsheet Row [facility]  Alcohol Use Disorder Identification Test Final Score (AUDIT) 0    PHQ2-9   Flowsheet Row Clinical Support from 04/12/2020 in [facility] ED from 03/16/2020 in [facility]  PHQ-2 Total Score 2 2  PHQ-9 Total Score 8 7    Flowsheet Row Clinical Support from 04/12/2020 in [facility] ED from 03/16/2020 in [facility] Admission (Discharged) [facility]  C-SSRS RISK CATEGORY No Risk No Risk No Risk      Assessment and Plan: Patient seems to be calmer and stable in terms of her mood today.  She is not irritable and guarded like her last visit.  She still dealing with a lot of financial challenges and is trying to figure them out one by 1.  She has applied for disability and is waiting to hear back regarding that.  1. Bipolar 1 disorder (HCC)  -Continue  lamoTRIgine (LAMICTAL) 200 MG tablet; TAKE 1 TABLET (200 MG TOTAL) BY MOUTH 2 (TWO) TIMES DAILY.  Dispense: 60 tablet; Refill: 1   Patient was once again offered to be connected to a therapist in this clinic, however patient declined that offer. Follow-up in 2 months.   01/24/2018, MD 4/25/20224:01 PM

## 2020-06-13 ENCOUNTER — Other Ambulatory Visit: Payer: Self-pay

## 2020-06-20 ENCOUNTER — Other Ambulatory Visit: Payer: Self-pay

## 2020-06-29 ENCOUNTER — Other Ambulatory Visit: Payer: Self-pay

## 2020-07-19 ENCOUNTER — Ambulatory Visit: Payer: Self-pay | Attending: Critical Care Medicine | Admitting: Critical Care Medicine

## 2020-07-19 ENCOUNTER — Encounter: Payer: Self-pay | Admitting: Critical Care Medicine

## 2020-07-19 ENCOUNTER — Encounter (INDEPENDENT_AMBULATORY_CARE_PROVIDER_SITE_OTHER): Payer: Self-pay

## 2020-07-19 ENCOUNTER — Other Ambulatory Visit: Payer: Self-pay

## 2020-07-19 VITALS — BP 124/78 | HR 75 | Resp 16 | Ht 60.0 in | Wt 137.2 lb

## 2020-07-19 DIAGNOSIS — Z853 Personal history of malignant neoplasm of breast: Secondary | ICD-10-CM | POA: Insufficient documentation

## 2020-07-19 DIAGNOSIS — Z1159 Encounter for screening for other viral diseases: Secondary | ICD-10-CM

## 2020-07-19 DIAGNOSIS — Z114 Encounter for screening for human immunodeficiency virus [HIV]: Secondary | ICD-10-CM

## 2020-07-19 DIAGNOSIS — F319 Bipolar disorder, unspecified: Secondary | ICD-10-CM

## 2020-07-19 DIAGNOSIS — Z139 Encounter for screening, unspecified: Secondary | ICD-10-CM | POA: Insufficient documentation

## 2020-07-19 DIAGNOSIS — E782 Mixed hyperlipidemia: Secondary | ICD-10-CM | POA: Insufficient documentation

## 2020-07-19 DIAGNOSIS — F17200 Nicotine dependence, unspecified, uncomplicated: Secondary | ICD-10-CM

## 2020-07-19 DIAGNOSIS — Z1231 Encounter for screening mammogram for malignant neoplasm of breast: Secondary | ICD-10-CM

## 2020-07-19 MED ORDER — NICOTINE POLACRILEX 4 MG MT LOZG
LOZENGE | OROMUCOSAL | 4 refills | Status: DC
  Filled 2020-07-19: qty 72, 24d supply, fill #0

## 2020-07-19 MED ORDER — OMEPRAZOLE 20 MG PO CPDR
20.0000 mg | DELAYED_RELEASE_CAPSULE | Freq: Every day | ORAL | 2 refills | Status: DC
  Filled 2020-07-19 – 2020-07-26 (×2): qty 30, 30d supply, fill #0
  Filled 2020-10-25: qty 60, 60d supply, fill #1

## 2020-07-19 MED ORDER — ESTRADIOL 0.1 MG/24HR TD PTTW
1.0000 | MEDICATED_PATCH | TRANSDERMAL | 3 refills | Status: DC
  Filled 2020-07-19: qty 8, 24d supply, fill #0
  Filled 2020-10-25: qty 8, 24d supply, fill #1

## 2020-07-19 MED ORDER — CETIRIZINE HCL 10 MG PO TABS
10.0000 mg | ORAL_TABLET | Freq: Every day | ORAL | 0 refills | Status: DC
  Filled 2020-07-19: qty 90, 90d supply, fill #0

## 2020-07-19 MED ORDER — ALBUTEROL SULFATE HFA 108 (90 BASE) MCG/ACT IN AERS
INHALATION_SPRAY | RESPIRATORY_TRACT | 0 refills | Status: DC
  Filled 2020-07-19: qty 18, 16d supply, fill #0

## 2020-07-19 NOTE — Patient Instructions (Addendum)
COVID-19 Vaccine Information can be found at: [email or web address] For questions related to vaccine distribution or appointments, please email [email or web address] or call [phone].   Refills on all your medications sent to our pharmacy  Begin nicotine lozenge take 3 times daily to quit smoking  Keep your follow-up appointments with your mental health provider  Mammogram will be scheduled  Tetanus vaccine was given  Complete set of screening labs will be obtained today  Return to see Dr. Delford Field in 3 months  Pick up a financial assistance packet  Td (Tetanus, Diphtheria) Vaccine: What You Need to Know 1. Why get vaccinated? Td vaccine can prevent tetanus and diphtheria. Tetanus enters the body through cuts or wounds. Diphtheria spreads from person to person.  TETANUS (T) causes painful stiffening of the muscles. Tetanus can lead to serious health problems, including being unable to open the mouth, having trouble swallowing and breathing, or death.  DIPHTHERIA (D) can lead to difficulty breathing, heart failure, paralysis, or death. 2. Td vaccine Td is only for children 7 years and older, adolescents, and adults.  Td is usually given as a booster dose every 10 years, or after 5 years in the case of a severe or dirty wound or burn. Another vaccine, called "Tdap," may be used instead of Td. Tdap protects against pertussis, also known as "whooping cough," in addition to tetanus and diphtheria. Td may be given at the same time as other vaccines. 3. Talk with your health care provider Tell your vaccination provider if the person getting the vaccine:  Has had an allergic reaction after a previous dose of any vaccine that protects against tetanus or diphtheria, or has any severe, life-threatening allergies  Has ever had Guillain-Barr Syndrome (also called "GBS")  Has had severe pain or swelling after a previous dose of any  vaccine that protects against tetanus or diphtheria In some cases, your health care provider may decide to postpone Td vaccination until a future visit. People with minor illnesses, such as a cold, may be vaccinated. People who are moderately or severely ill should usually wait until they recover before getting Td vaccine.  Your health care provider can give you more information. 4. Risks of a vaccine reaction  Pain, redness, or swelling where the shot was given, mild fever, headache, feeling tired, and nausea, vomiting, diarrhea, or stomachache sometimes happen after Td vaccination. People sometimes faint after medical procedures, including vaccination. Tell your provider if you feel dizzy or have vision changes or ringing in the ears.  As with any medicine, there is a very remote chance of a vaccine causing a severe allergic reaction, other serious injury, or death. 5. What if there is a serious problem? An allergic reaction could occur after the vaccinated person leaves the clinic. If you see signs of a severe allergic reaction (hives, swelling of the face and throat, difficulty breathing, a fast heartbeat, dizziness, or weakness), call 9-1-1 and get the person to the nearest hospital.  For other signs that concern you, call your health care provider.  Adverse reactions should be reported to the Vaccine Adverse Event Reporting System (VAERS). Your health care provider will usually file this report, or you can do it yourself. Visit the VAERS website at www.vaers.LAgents.no or call 249-631-0632. VAERS is only for reporting reactions, and VAERS staff members do not give medical advice. 6. The National Vaccine Injury Compensation Program The National Vaccine Injury Compensation Program (VICP) is a federal program that was created to compensate people who may have been  injured by certain vaccines. Claims regarding alleged injury or death due to vaccination have a time limit for filing, which may be as  short as two years. Visit the VICP website at SpiritualWord.at or call 838-786-1103 to learn about the program and about filing a claim. 7. How can I learn more?  Ask your health care provider.  Call your local or state health department.  Visit the website of the Food and Drug Administration (FDA) for vaccine package inserts and additional information at FinderList.no.  Contact the Centers for Disease Control and Prevention (CDC): ? Call 6471647384 (1-800-CDC-INFO) or ? Visit CDC's website at PicCapture.uy. Vaccine Information Statement Td (Tetanus, Diphtheria) Vaccine (09/24/2019) This information is not intended to replace advice given to you by your health care provider. Make sure you discuss any questions you have with your health care provider. Document Revised: 11/11/2019 Document Reviewed: 11/11/2019 Elsevier Patient Education  2021 [employer].

## 2020-07-19 NOTE — Assessment & Plan Note (Signed)
Bipolar disorder stable at this time patient continue follow-up with mental health and continue Lamictal

## 2020-07-19 NOTE — Assessment & Plan Note (Signed)
History of breast cancer with lumpectomy in the past  Follow-up with screening mammogram

## 2020-07-19 NOTE — Assessment & Plan Note (Signed)
  .   Current smoking consumption amount: 1 pack a day  . Dicsussion on advise to quit smoking and smoking impacts: Pulmonary impacts  . Patient's willingness to quit: Willing to quit  . Methods to quit smoking discussed: Use of nicotine replacement behavioral modification  . Medication management of smoking session drugs discussed: Nicotine lozenges 4 mg 3 times daily  . Resources provided:  AVS   . Setting quit date not established  . Follow-up arranged 2 months   Time spent counseling the patient: 5 minutes

## 2020-07-19 NOTE — Progress Notes (Signed)
New Patient Office Visit  Subjective:  Patient ID: Chloe [name], female    DOB: 1965-08-25  Age: 55 y.o. MRN: [id number]  CC:  Chief Complaint  Patient presents with  . New Patient (Initial Visit)    HPI Chloe [name] presents for PCP to est. This patient is here to establish for primary care.  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  She is in the process of trying to get disability with Medicaid but is also interested in getting financial assistance with the [facility] health program.  Patient has history of bipolar disorder and is followed at the behavioral health center now on Lamictal 200 mg twice daily.  Patient also is taking omega-3 capsules as she has not had access to any statin therapy.  She does have history of hyperlipidemia.  Also history of reflux disease off all medications but having active symptoms.  Patient also history of active tobacco use she is smoking about a pack a day of cigarettes and has tried nicotine patches in the past which she states partially helped.  Patient has postmenopausal symptoms and is requesting refill on her Vivelle patch.  Patient does have chronic allergies and mild intermittent asthma symptoms for which she uses the albuterol.  The patient is due a tetanus vaccine she will agree to receive this.  Patient has history of lumpectomy for breast cancer in remission this was in 2018 she needs another screening mammogram.  Patient's had a total hysterectomy in the past  Patient is also status postcholecystectomy  Patient did complete 2 COVID-vaccine series with Pfizer in 2021 she is not interested in receiving the booster vaccine  Patient's been less active and has gained weight her BMI is now up to 26.8 but on arrival blood pressure is 124/78    Past Medical History:  Diagnosis Date  . Anxiety   . Bipolar disorder (HCC)   . Breast mass, right    . GERD (gastroesophageal reflux disease)   . Hyperlipidemia   . Post-operative nausea and vomiting   . Smoker     Past Surgical History:  Procedure Laterality Date  . ABDOMINAL HYSTERECTOMY     had nausea and vomiting post-op  . BREAST LUMPECTOMY WITH RADIOACTIVE SEED LOCALIZATION Right 11/12/2016   Procedure: RIGHT BREAST LUMPECTOMY WITH RADIOACTIVE SEED LOCALIZATION;  Surgeon: Harriette Bouillon, MD;  Location: [facility];  Service: General;  Laterality: Right;  . CHOLECYSTECTOMY    . LAPAROSCOPIC LYSIS OF ADHESIONS    . MYOMECTOMY    . PLANTAR FASCIA SURGERY Left     Family History  Problem Relation Age of Onset  . Lung cancer Mother   . Hyperlipidemia Brother   . Colon cancer Neg Hx   . Esophageal cancer Neg Hx   . Stomach cancer Neg Hx     Social History   Socioeconomic History  . Marital status: Divorced    Spouse name: Not on file  . Number of children: Not on file  . Years of education: Not on file  . Highest education level: Not on file  Occupational History  . Not on file  Tobacco Use  . Smoking status: Current Some Day Smoker    Packs/day: 0.50    Types: Cigarettes  . Smokeless tobacco: Never Used  Vaping Use  . Vaping Use: Never used  Substance and Sexual Activity  . Alcohol use: Not Currently  Comment: social  . Drug use: No  . Sexual activity: Not Currently  Other Topics Concern  . Not on file  Social History Narrative  . Not on file   Social Determinants of Health   Financial Resource Strain: Not on file  Food Insecurity: Not on file  Transportation Needs: Not on file  Physical Activity: Not on file  Stress: Not on file  Social Connections: Not on file  Intimate Partner Violence: Not on file    ROS Review of Systems  Constitutional: Positive for fatigue.  HENT: Negative.   Respiratory: Negative.   Cardiovascular: Negative.   Gastrointestinal: Negative.   Genitourinary: Negative.   Musculoskeletal: Negative.    Neurological: Negative.   Psychiatric/Behavioral: The patient is nervous/anxious.     Objective:   Today's Vitals: BP 124/78   Pulse 75   Resp 16   Ht 5' (1.524 m)   Wt 137 lb 3.2 oz (62.2 kg)   SpO2 95%   BMI 26.80 kg/m   Physical Exam Constitutional:      Appearance: Normal appearance.  HENT:     Head: Normocephalic and atraumatic.     Nose: Nose normal.     Mouth/Throat:     Mouth: Mucous membranes are dry.     Pharynx: Oropharynx is clear.  Eyes:     Extraocular Movements: Extraocular movements intact.     Conjunctiva/sclera: Conjunctivae normal.     Pupils: Pupils are equal, round, and reactive to light.  Cardiovascular:     Rate and Rhythm: Normal rate.     Pulses: Normal pulses.     Heart sounds: Normal heart sounds. No murmur heard. No gallop.   Pulmonary:     Effort: Pulmonary effort is normal.     Breath sounds: Normal breath sounds.  Abdominal:     General: Abdomen is flat. Bowel sounds are normal.     Palpations: Abdomen is soft.  Musculoskeletal:     Cervical back: Normal range of motion and neck supple.  Skin:    General: Skin is warm.  Neurological:     General: No focal deficit present.     Mental Status: She is alert and oriented to person, place, and time. Mental status is at baseline.  Psychiatric:        Mood and Affect: Mood normal.        Behavior: Behavior normal.        Thought Content: Thought content normal.        Judgment: Judgment normal.     Assessment & Plan:   Problem List Items Addressed This Visit      Other   Bipolar 1 disorder (HCC)    Bipolar disorder stable at this time patient continue follow-up with mental health and continue Lamictal      Mixed hyperlipidemia - Primary   Relevant Medications   aspirin EC 81 MG tablet   Other Relevant Orders   Lipid panel   Encounter for health-related screening    Patient will have follow-up labs for health screening       Relevant Orders   Comprehensive metabolic  panel   CBC with Differential/Platelet   Thyroid Panel With TSH   History of breast cancer    History of breast cancer with lumpectomy in the past  Follow-up with screening mammogram      Tobacco dependency       . Current smoking consumption amount: 1 pack a day  . Dicsussion on advise to quit smoking  and smoking impacts: Pulmonary impacts  . Patient's willingness to quit: Willing to quit  . Methods to quit smoking discussed: Use of nicotine replacement behavioral modification  . Medication management of smoking session drugs discussed: Nicotine lozenges 4 mg 3 times daily  . Resources provided:  AVS   . Setting quit date not established  . Follow-up arranged 2 months   Time spent counseling the patient: 5 minutes       Relevant Medications   nicotine polacrilex (NICORETTE MINI) 4 MG lozenge    Other Visit Diagnoses    Need for hepatitis C screening test       Relevant Orders   HCV Ab w Reflex to Quant PCR   Encounter for screening for HIV       Relevant Orders   HIV Antibody (routine testing w rflx)   Encounter for screening mammogram for malignant neoplasm of breast       Relevant Orders   MM DIGITAL SCREENING BILATERAL      Outpatient Encounter Medications as of 07/19/2020  Medication Sig  . aspirin EC 81 MG tablet Take 81 mg by mouth daily. Swallow whole.  . lamoTRIgine (LAMICTAL) 200 MG tablet TAKE 1 TABLET (200 MG TOTAL) BY MOUTH 2 (TWO) TIMES DAILY.  . nicotine polacrilex (NICORETTE MINI) 4 MG lozenge Take one three times daily  . Omega-3 1000 MG CAPS Take 1 capsule by mouth daily.  Marland Kitchen albuterol (PROAIR HFA) 108 (90 Base) MCG/ACT inhaler INHALE 2 PUFFS EVERY 4-6 HOURS AS NEEDED  . cetirizine (ZYRTEC ALLERGY) 10 MG tablet Take 1 tablet (10 mg total) by mouth daily.  Marland Kitchen estradiol (VIVELLE-DOT) 0.1 MG/24HR patch Place 1 patch (0.1 mg total) onto the skin every 3 (three) days.  Marland Kitchen omeprazole (PRILOSEC) 20 MG capsule Take 1 capsule (20 mg total) by mouth  daily.  . [DISCONTINUED] budesonide-formoterol (SYMBICORT) 160-4.5 MCG/ACT inhaler Inhale into the lungs. (Patient not taking: Reported on 07/19/2020)  . [DISCONTINUED] cetirizine (ZYRTEC ALLERGY) 10 MG tablet Take 1 tablet (10 mg total) by mouth daily. (Patient not taking: Reported on 07/19/2020)  . [DISCONTINUED] estradiol (VIVELLE-DOT) 0.1 MG/24HR patch Place 0.1 mg onto the skin every 3 (three) days.  (Patient not taking: Reported on 07/19/2020)  . [DISCONTINUED] omeprazole (PRILOSEC) 20 MG capsule Take 20 mg by mouth daily. (Patient not taking: Reported on 07/19/2020)  . [DISCONTINUED] pravastatin (PRAVACHOL) 20 MG tablet Take 20 mg by mouth daily. (Patient not taking: Reported on 07/19/2020)  . [DISCONTINUED] predniSONE (DELTASONE) 20 MG tablet Take 2 tablets daily with breakfast. (Patient not taking: Reported on 07/19/2020)  . [DISCONTINUED] PROAIR HFA 108 (90 Base) MCG/ACT inhaler INHALE 2 PUFFS EVERY 4-6 HOURS AS NEEDED   No facility-administered encounter medications on file as of 07/19/2020.    Follow-up: Return in about 2 months (around 09/18/2020).   Shan Levans, MD

## 2020-07-19 NOTE — Assessment & Plan Note (Signed)
Patient will have follow-up labs for health screening

## 2020-07-20 ENCOUNTER — Telehealth: Payer: Self-pay

## 2020-07-20 ENCOUNTER — Other Ambulatory Visit: Payer: Self-pay

## 2020-07-20 LAB — COMPREHENSIVE METABOLIC PANEL
ALT: 28 IU/L (ref 0–32)
AST: 24 IU/L (ref 0–40)
Albumin/Globulin Ratio: 1.7 (ref 1.2–2.2)
Albumin: 4.8 g/dL (ref 3.8–4.9)
Alkaline Phosphatase: 150 IU/L — ABNORMAL HIGH (ref 44–121)
BUN/Creatinine Ratio: 15 (ref 9–23)
BUN: 9 mg/dL (ref 6–24)
Bilirubin Total: 0.3 mg/dL (ref 0.0–1.2)
CO2: 23 mmol/L (ref 20–29)
Calcium: 10.1 mg/dL (ref 8.7–10.2)
Chloride: 101 mmol/L (ref 96–106)
Creatinine, Ser: 0.61 mg/dL (ref 0.57–1.00)
Globulin, Total: 2.9 g/dL (ref 1.5–4.5)
Glucose: 85 mg/dL (ref 65–99)
Potassium: 4.9 mmol/L (ref 3.5–5.2)
Sodium: 139 mmol/L (ref 134–144)
Total Protein: 7.7 g/dL (ref 6.0–8.5)
eGFR: 107 mL/min/{1.73_m2} (ref >59)

## 2020-07-20 LAB — LIPID PANEL
Chol/HDL Ratio: 4.1 ratio (ref 0.0–4.4)
Cholesterol, Total: 220 mg/dL — ABNORMAL HIGH (ref 100–199)
HDL: 54 mg/dL (ref >39)
LDL Chol Calc (NIH): 142 mg/dL — ABNORMAL HIGH (ref 0–99)
Triglycerides: 134 mg/dL (ref 0–149)
VLDL Cholesterol Cal: 24 mg/dL (ref 5–40)

## 2020-07-20 LAB — HCV AB W REFLEX TO QUANT PCR: HCV Ab: <0.1 s/co ratio (ref 0.0–0.9)

## 2020-07-20 LAB — CBC WITH DIFFERENTIAL/PLATELET
Basophils Absolute: 0.1 10*3/uL (ref 0.0–0.2)
Basos: 1 %
EOS (ABSOLUTE): 0.3 10*3/uL (ref 0.0–0.4)
Eos: 3 %
Hematocrit: 46.4 % (ref 34.0–46.6)
Hemoglobin: 16 g/dL — ABNORMAL HIGH (ref 11.1–15.9)
Immature Grans (Abs): 0.1 10*3/uL (ref 0.0–0.1)
Immature Granulocytes: 1 %
Lymphocytes Absolute: 3.5 10*3/uL — ABNORMAL HIGH (ref 0.7–3.1)
Lymphs: 35 %
MCH: 30.7 pg (ref 26.6–33.0)
MCHC: 34.5 g/dL (ref 31.5–35.7)
MCV: 89 fL (ref 79–97)
Monocytes Absolute: 0.7 10*3/uL (ref 0.1–0.9)
Monocytes: 7 %
Neutrophils Absolute: 5.4 10*3/uL (ref 1.4–7.0)
Neutrophils: 53 %
Platelets: 450 10*3/uL (ref 150–450)
RBC: 5.22 x10E6/uL (ref 3.77–5.28)
RDW: 12.5 % (ref 11.7–15.4)
WBC: 10.2 10*3/uL (ref 3.4–10.8)

## 2020-07-20 LAB — THYROID PANEL WITH TSH
Free Thyroxine Index: 2.5 (ref 1.2–4.9)
T3 Uptake Ratio: 23 % — ABNORMAL LOW (ref 24–39)
T4, Total: 10.9 ug/dL (ref 4.5–12.0)
TSH: 1.29 u[IU]/mL (ref 0.450–4.500)

## 2020-07-20 LAB — HCV INTERPRETATION

## 2020-07-20 LAB — HIV ANTIBODY (ROUTINE TESTING W REFLEX): HIV Screen 4th Generation wRfx: NONREACTIVE

## 2020-07-20 NOTE — Telephone Encounter (Signed)
Contacted pt to go over lab results pt didn't answer. Per pt when she was in the office she stated she didn't have a phone

## 2020-07-26 ENCOUNTER — Other Ambulatory Visit: Payer: Self-pay

## 2020-08-08 ENCOUNTER — Ambulatory Visit (INDEPENDENT_AMBULATORY_CARE_PROVIDER_SITE_OTHER): Payer: No Payment, Other | Admitting: Psychiatry

## 2020-08-08 ENCOUNTER — Other Ambulatory Visit: Payer: Self-pay

## 2020-08-08 ENCOUNTER — Encounter (HOSPITAL_COMMUNITY): Payer: Self-pay | Admitting: Psychiatry

## 2020-08-08 VITALS — BP 121/83 | HR 92 | Ht 61.0 in | Wt 140.0 lb

## 2020-08-08 DIAGNOSIS — F319 Bipolar disorder, unspecified: Secondary | ICD-10-CM | POA: Diagnosis not present

## 2020-08-08 DIAGNOSIS — Z599 Problem related to housing and economic circumstances, unspecified: Secondary | ICD-10-CM | POA: Diagnosis not present

## 2020-08-08 MED ORDER — LAMOTRIGINE 200 MG PO TABS
ORAL_TABLET | Freq: Two times a day (BID) | ORAL | 2 refills | Status: DC
  Filled 2020-08-08: qty 60, fill #0
  Filled 2020-08-29: qty 60, 30d supply, fill #0
  Filled 2020-10-25: qty 120, 60d supply, fill #1

## 2020-08-08 NOTE — Progress Notes (Signed)
BH OP Progress Note   Patient Identification: Chloe [name] MRN:  [id number] Date of Evaluation:  08/08/2020    Chief Complaint:  " I am okay."  Visit Diagnosis:    ICD-10-CM   1. Bipolar 1 disorder (HCC)  F31.9     2. Financial difficulties  Z59.9       History of Present Illness: Patient ports she is doing okay.  She stated that she has somehow managed to get her car back and she is happy about that.  She stated that she is able to use her car to get to the local food store as she got her food stamps approved.  She stated that she is eating much better now.  She stated that she is not as irritable or agitated as she was. She attended a wedding recently and she was happy to be a part of it as it was a good get away for her. She still is struggling to keep her phone active, she has 3 main bills to pay off and she is trying to take care of them before she gets her phone activated.  Her brother has been helping her.  He has been coming back to mow her yard because her lawnmower battery is dead. She stated that being in the situation is very difficult for her but she is trying to get by. She has applied for disability and right now she is just waiting to hear back.  She is hoping that things will be in her favor.  She informed that she has reached out to her therapist who used to work with her in the past.  She stated that she may be able to set up virtual visits with them and she mentioned that they had told her something about volunteering opportunities at this clinic.  She stated she was not aware of the full details that she will reach out to them and then contact the clinic.  Past Psychiatric History: Bipolar disorder, several psychiatric hospitalizations in the past. She was last admitted 25 November 2017 and was noted to have psychotic symptoms along with manic features during that hospital stay. She was discharged on risperidone, carbamazepine and trazodone at that time.  Previous  Psychotropic Medications: Yes  -Risperdal, Depakote, trazodone, carbamazepine, Lamictal   Substance Abuse History in the last 12 months:  No.  Consequences of Substance Abuse: NA  Past Medical History:  Past Medical History:  Diagnosis Date   Anxiety    Bipolar disorder (HCC)    Breast mass, right    GERD (gastroesophageal reflux disease)    Hyperlipidemia    Post-operative nausea and vomiting    Smoker     Past Surgical History:  Procedure Laterality Date   ABDOMINAL HYSTERECTOMY     had nausea and vomiting post-op   BREAST LUMPECTOMY WITH RADIOACTIVE SEED LOCALIZATION Right 11/12/2016   Procedure: RIGHT BREAST LUMPECTOMY WITH RADIOACTIVE SEED LOCALIZATION;  Surgeon: Harriette Bouillon, MD;  Location: [facility];  Service: General;  Laterality: Right;   CHOLECYSTECTOMY     LAPAROSCOPIC LYSIS OF ADHESIONS     MYOMECTOMY     PLANTAR FASCIA SURGERY Left     Family Psychiatric History: refused to answer  Family History:  Family History  Problem Relation Age of Onset   Lung cancer Mother    Hyperlipidemia Brother    Colon cancer Neg Hx    Esophageal cancer Neg Hx    Stomach cancer Neg Hx  Social History:   Social History   Socioeconomic History   Marital status: Divorced    Spouse name: Not on file   Number of children: Not on file   Years of education: Not on file   Highest education level: Not on file  Occupational History   Not on file  Tobacco Use   Smoking status: Some Days    Packs/day: 0.50    Pack years: 0.00    Types: Cigarettes   Smokeless tobacco: Never  Vaping Use   Vaping Use: Never used  Substance and Sexual Activity   Alcohol use: Not Currently    Comment: social   Drug use: No   Sexual activity: Not Currently  Other Topics Concern   Not on file  Social History Narrative   Not on file   Social Determinants of Health   Financial Resource Strain: Not on file  Food Insecurity: Not on file  Transportation Needs: Not on  file  Physical Activity: Not on file  Stress: Not on file  Social Connections: Not on file    Additional Social History: Unemployed, lives by herself, no children  Allergies:   Allergies  Allergen Reactions   Penicillins Hives    Has patient had a PCN reaction causing immediate rash, facial/tongue/throat swelling, SOB or lightheadedness with hypotension: Yes Has patient had a PCN reaction causing severe rash involving mucus membranes or skin necrosis: No Has patient had a PCN reaction that required hospitalization: No Has patient had a PCN reaction occurring within the last 10 years: Yes If all of the above answers are "NO", then may proceed with Cephalosporin use.   Demerol [Meperidine]     Metabolic Disorder Labs: Lab Results  Component Value Date   HGBA1C 5.2 11/30/2017   MPG 102.54 11/30/2017   Lab Results  Component Value Date   PROLACTIN 12.4 11/30/2017   Lab Results  Component Value Date   CHOL 220 (H) 07/19/2020   TRIG 134 07/19/2020   HDL 54 07/19/2020   CHOLHDL 4.1 07/19/2020   VLDL 18 11/30/2017   LDLCALC 142 (H) 07/19/2020   LDLCALC 100 (H) 11/30/2017   Lab Results  Component Value Date   TSH 1.290 07/19/2020    Therapeutic Level Labs: No results found for: LITHIUM Lab Results  Component Value Date   CBMZ 8.3 11/30/2017   No results found for: VALPROATE  Current Medications: Current Outpatient Medications  Medication Sig Dispense Refill   albuterol (PROAIR HFA) 108 (90 Base) MCG/ACT inhaler INHALE 2 PUFFS EVERY 4-6 HOURS AS NEEDED 18 g 0   aspirin EC 81 MG tablet Take 81 mg by mouth daily. Swallow whole.     cetirizine (ZYRTEC ALLERGY) 10 MG tablet Take 1 tablet (10 mg total) by mouth daily. 90 tablet 0   estradiol (VIVELLE-DOT) 0.1 MG/24HR patch Place 1 patch (0.1 mg total) onto the skin every 3 (three) days. 10 patch 3   lamoTRIgine (LAMICTAL) 200 MG tablet TAKE 1 TABLET (200 MG TOTAL) BY MOUTH 2 (TWO) TIMES DAILY. 60 tablet 1   nicotine  polacrilex (NICORETTE MINI) 4 MG lozenge Take one three times daily 100 tablet 4   Omega-3 1000 MG CAPS Take 1 capsule by mouth daily.     omeprazole (PRILOSEC) 20 MG capsule Take 1 capsule (20 mg total) by mouth daily. 30 capsule 2   No current facility-administered medications for this visit.    Musculoskeletal: Strength & Muscle Tone: within normal limits Gait & Station: normal Patient leans: N/A  Psychiatric Specialty Exam: Review of Systems  Blood pressure 121/83, pulse 92, height 5\' 1"  (1.549 m), weight 140 lb (63.5 kg), SpO2 98 %.Body mass index is 26.45 kg/m.  General Appearance: Fairly Groomed  Eye Contact:  Good  Speech:  Clear and Coherent and Normal Rate  Volume:  Normal  Mood:  Euthymic  Affect:  Congruent  Thought Process:  Goal Directed and Descriptions of Associations: Intact  Orientation:  Full (Time, Place, and Person)  Thought Content:  Logical and Rumination  Suicidal Thoughts:  No  Homicidal Thoughts:  No  Memory:  Immediate;   Good Recent;   Good  Judgement:  Fair  Insight:  Fair  Psychomotor Activity:  Normal  Concentration:  Concentration: Good and Attention Span: Good  Recall:  Good  Fund of Knowledge:Good  Language: Good  Akathisia:  No  Handed:  Right  AIMS (if indicated):  0  Assets:  Communication Skills Desire for Improvement Housing Talents/Skills  ADL's:  Intact  Cognition: WNL  Sleep:  Fair   Screenings: AUDIT    Flowsheet Row [facility]  Alcohol Use Disorder Identification Test Final Score (AUDIT) 0      GAD-7    Flowsheet Row Office Visit from 07/19/2020 in [facility]  Total GAD-7 Score 13      PHQ2-9    Flowsheet Row Office Visit from 07/19/2020 in [facility] Clinical Support from [facility]/12/2020 in [facility] from 03/16/2020 in [facility]  PHQ-2 Total Score 2 2 2   PHQ-9 Total Score 11 8 7       Flowsheet Row Clinical Support from 04/12/2020 in [facility] from 03/16/2020 in [facility] ([facility]  C-SSRS RISK CATEGORY No Risk No Risk No Risk       Assessment and Plan: Patient is stable in terms of her mood symptoms however is still dealing with a lot of financial constraints.  Her brother has been helping her but she still feels quite limited.  We will continue the same regimen for now.  1. Bipolar 1 disorder (HCC)  -Continue  lamoTRIgine (LAMICTAL) 200 MG tablet; TAKE 1 TABLET (200 MG TOTAL) BY MOUTH 2 (TWO) TIMES DAILY.  Dispense: 60 tablet; Refill: 2  Continue same regimen. Follow-up in 3 months. Patient wants to restart therapy with her old therapist from a different agency.  Patient was made aware that writer is leaving the office therefore her care will be transferred to a different provider at this clinic.    03/18/2020, MD 6/21/20221:47 PM

## 2020-08-29 ENCOUNTER — Other Ambulatory Visit: Payer: Self-pay

## 2020-09-06 ENCOUNTER — Emergency Department (HOSPITAL_COMMUNITY)
Admission: EM | Admit: 2020-09-06 | Discharge: 2020-09-07 | Disposition: A | Discharge status: Home / Self Care | Payer: Self-pay | Attending: Emergency Medicine | Admitting: Emergency Medicine

## 2020-09-06 ENCOUNTER — Other Ambulatory Visit: Payer: Self-pay

## 2020-09-06 DIAGNOSIS — R6883 Chills (without fever): Secondary | ICD-10-CM | POA: Insufficient documentation

## 2020-09-06 DIAGNOSIS — K219 Gastro-esophageal reflux disease without esophagitis: Secondary | ICD-10-CM | POA: Insufficient documentation

## 2020-09-06 DIAGNOSIS — Z7982 Long term (current) use of aspirin: Secondary | ICD-10-CM | POA: Insufficient documentation

## 2020-09-06 DIAGNOSIS — F17210 Nicotine dependence, cigarettes, uncomplicated: Secondary | ICD-10-CM | POA: Insufficient documentation

## 2020-09-06 DIAGNOSIS — R109 Unspecified abdominal pain: Secondary | ICD-10-CM

## 2020-09-06 DIAGNOSIS — K5900 Constipation, unspecified: Secondary | ICD-10-CM | POA: Insufficient documentation

## 2020-09-06 DIAGNOSIS — Z853 Personal history of malignant neoplasm of breast: Secondary | ICD-10-CM | POA: Insufficient documentation

## 2020-09-06 DIAGNOSIS — R112 Nausea with vomiting, unspecified: Secondary | ICD-10-CM | POA: Insufficient documentation

## 2020-09-06 LAB — CBC
HCT: 49.8 % — ABNORMAL HIGH (ref 36.0–46.0)
Hemoglobin: 17.3 g/dL — ABNORMAL HIGH (ref 12.0–15.0)
MCH: 31.9 pg (ref 26.0–34.0)
MCHC: 34.7 g/dL (ref 30.0–36.0)
MCV: 91.7 fL (ref 80.0–100.0)
Platelets: 452 10*3/uL — ABNORMAL HIGH (ref 150–400)
RBC: 5.43 MIL/uL — ABNORMAL HIGH (ref 3.87–5.11)
RDW: 12.5 % (ref 11.5–15.5)
WBC: 13.1 10*3/uL — ABNORMAL HIGH (ref 4.0–10.5)
nRBC: 0 % (ref 0.0–0.2)

## 2020-09-06 LAB — COMPREHENSIVE METABOLIC PANEL
ALT: 33 U/L (ref 0–44)
AST: 25 U/L (ref 15–41)
Albumin: 4.1 g/dL (ref 3.5–5.0)
Alkaline Phosphatase: 121 U/L (ref 38–126)
Anion gap: 10 (ref 5–15)
BUN: 5 mg/dL — ABNORMAL LOW (ref 6–20)
CO2: 25 mmol/L (ref 22–32)
Calcium: 9.6 mg/dL (ref 8.9–10.3)
Chloride: 100 mmol/L (ref 98–111)
Creatinine, Ser: 0.5 mg/dL (ref 0.44–1.00)
GFR, Estimated: >60 mL/min (ref >60)
Glucose, Bld: 100 mg/dL — ABNORMAL HIGH (ref 70–99)
Potassium: 3.9 mmol/L (ref 3.5–5.1)
Sodium: 135 mmol/L (ref 135–145)
Total Bilirubin: 0.4 mg/dL (ref 0.3–1.2)
Total Protein: 7.7 g/dL (ref 6.5–8.1)

## 2020-09-06 LAB — URINALYSIS, ROUTINE W REFLEX MICROSCOPIC
Bilirubin Urine: NEGATIVE
Glucose, UA: NEGATIVE mg/dL
Hgb urine dipstick: NEGATIVE
Ketones, ur: NEGATIVE mg/dL
Leukocytes,Ua: NEGATIVE
Nitrite: NEGATIVE
Protein, ur: NEGATIVE mg/dL
Specific Gravity, Urine: 1.009 (ref 1.005–1.030)
pH: 6 (ref 5.0–8.0)

## 2020-09-06 LAB — LIPASE, BLOOD: Lipase: 29 U/L (ref 11–51)

## 2020-09-06 LAB — I-STAT BETA HCG BLOOD, ED (MC, WL, AP ONLY): I-stat hCG, quantitative: <5 m[IU]/mL (ref <5)

## 2020-09-06 NOTE — ED Triage Notes (Signed)
RLQ pain radiating to umbilical area and constipation x 3 days.

## 2020-09-06 NOTE — ED Provider Notes (Signed)
Emergency Medicine Provider Triage Evaluation Note  Chloe [name] , a 55 y.o. female  was evaluated in triage.  Pt complains of right-sided abdominal pain radiating to the umbilicus and right lower back beginning 3 days ago.  Accompanied by constipation.  Review of Systems  Positive: Abdominal pain, back pain, constipation Negative: Fever, urinary symptoms, vomiting  Physical Exam  BP (!) 161/104 (BP Location: Left Arm)   Pulse 74   Temp 98.6 F (37 C)   Resp 18   Ht 5\' 1"  (1.549 m)   Wt 63.5 kg   SpO2 95%   BMI 26.45 kg/m  Gen:   Awake, no distress   Resp:  Normal effort  MSK:   Moves extremities without difficulty  Other:  Periumbilical tenderness  Medical Decision Making  Medically screening exam initiated at 7:30 PM.  Appropriate orders placed.  Chloe [name] was informed that the remainder of the evaluation will be completed by another provider, this initial triage assessment does not replace that evaluation, and the importance of remaining in the ED until their evaluation is complete.     [name], PA-C 09/06/20 1942    09/08/20, MD 09/06/20 [phone]

## 2020-09-07 ENCOUNTER — Other Ambulatory Visit: Payer: Self-pay

## 2020-09-07 ENCOUNTER — Emergency Department (HOSPITAL_COMMUNITY): Payer: Self-pay

## 2020-09-07 MED ORDER — IOHEXOL 300 MG/ML  SOLN
100.0000 mL | Freq: Once | INTRAMUSCULAR | Status: AC | PRN
  Administered 2020-09-07: 100 mL via INTRAVENOUS

## 2020-09-07 MED ORDER — POLYETHYLENE GLYCOL 3350 17 G PO PACK
17.0000 g | PACK | Freq: Every day | ORAL | 0 refills | Status: DC
  Filled 2020-09-07: qty 14, 14d supply, fill #0

## 2020-09-07 NOTE — ED Provider Notes (Signed)
[facility] EMERGENCY DEPARTMENT Provider Note   CSN: [id number] Arrival date & time: 09/06/20  1637     History Chief Complaint  Patient presents with   Abdominal Pain    Chloe [name] is a 55 y.o. female with a history of bipolar disorder, hyperlipidemia, GERD, anxiety, cholecystectomy, hysterectomy.  Patient presents emerged department with a chief right flank pain radiates to right lower quadrant.  Patient reports her pain has been constant over the last 3 to 4 years.  Patient was sent from [place] for this time.  He has gotten progressively worse worse.  No aggravating or relieving factors.  Patient states that pain has gotten better in the last 12 hours.  At present patient rates pain 4/10 on pain scale.  She reports that she had 1 episode of nausea vomiting over the last 4 days.  Patient describes emesis as bile contents.  No hematemesis or coffee-ground emesis.  Patient also endorses constipation.  States that she has not had a bowel movement in the last 4 days.  Patient has been able to pass flatus.  Patient denies taking any opiate pain medication or illicit opiate drug.  Endorses chills.  Patient denies any dysuria, hematuria, urinary frequency, vaginal pain, vaginal discharge, vaginal bleeding, fevers.     Abdominal Pain Associated symptoms: chills, constipation, nausea and vomiting   Associated symptoms: no chest pain, no diarrhea, no dysuria, no fever, no hematuria, no shortness of breath, no vaginal bleeding and no vaginal discharge       Past Medical History:  Diagnosis Date   Anxiety    Bipolar disorder (HCC)    Breast mass, right    GERD (gastroesophageal reflux disease)    Hyperlipidemia    Post-operative nausea and vomiting    Smoker     Patient Active Problem List   Diagnosis Date Noted   Mixed hyperlipidemia 07/19/2020   Encounter for health-related screening 07/19/2020   History of breast cancer 07/19/2020   Tobacco dependency  07/19/2020   Financial difficulties 04/12/2020   Bipolar affective disorder, depressed, severe, with psychotic behavior (HCC) 11/24/2017   Bipolar 1 disorder (HCC) 11/24/2017    Past Surgical History:  Procedure Laterality Date   ABDOMINAL HYSTERECTOMY     had nausea and vomiting post-op   BREAST LUMPECTOMY WITH RADIOACTIVE SEED LOCALIZATION Right 11/12/2016   Procedure: RIGHT BREAST LUMPECTOMY WITH RADIOACTIVE SEED LOCALIZATION;  Surgeon: Harriette Bouillon, MD;  Location: [facility];  Service: General;  Laterality: Right;   CHOLECYSTECTOMY     LAPAROSCOPIC LYSIS OF ADHESIONS     MYOMECTOMY     PLANTAR FASCIA SURGERY Left      OB History   No obstetric history on file.     Family History  Problem Relation Age of Onset   Lung cancer Mother    Hyperlipidemia Brother    Colon cancer Neg Hx    Esophageal cancer Neg Hx    Stomach cancer Neg Hx     Social History   Tobacco Use   Smoking status: Some Days    Packs/day: 0.50    Types: Cigarettes   Smokeless tobacco: Never  Vaping Use   Vaping Use: Never used  Substance Use Topics   Alcohol use: Not Currently    Comment: social   Drug use: No    Home Medications Prior to Admission medications   Medication Sig Start Date End Date Taking? Authorizing Provider  albuterol (PROAIR HFA) 108 (90 Base) MCG/ACT inhaler INHALE  2 PUFFS EVERY 4-6 HOURS AS NEEDED 07/19/20   Storm Frisk, MD  aspirin EC 81 MG tablet Take 81 mg by mouth daily. Swallow whole.    [provider]  cetirizine (ZYRTEC ALLERGY) 10 MG tablet Take 1 tablet (10 mg total) by mouth daily. 07/19/20   Storm Frisk, MD  estradiol (VIVELLE-DOT) 0.1 MG/24HR patch Place 1 patch (0.1 mg total) onto the skin every 3 (three) days. 07/19/20 08/19/20  Storm Frisk, MD  lamoTRIgine (LAMICTAL) 200 MG tablet TAKE 1 TABLET (200 MG TOTAL) BY MOUTH 2 (TWO) TIMES DAILY. 08/08/20 08/08/21  Zena Amos, MD  nicotine polacrilex (NICORETTE MINI) 4 MG  lozenge Take one three times daily 07/19/20   Storm Frisk, MD  Omega-3 1000 MG CAPS Take 1 capsule by mouth daily.    [provider]  omeprazole (PRILOSEC) 20 MG capsule Take 1 capsule (20 mg total) by mouth daily. 07/19/20   Storm Frisk, MD    Allergies    Penicillins and Demerol [meperidine]  Review of Systems   Review of Systems  Constitutional:  Positive for chills. Negative for fever.  Eyes:  Negative for visual disturbance.  Respiratory:  Negative for shortness of breath.   Cardiovascular:  Negative for chest pain.  Gastrointestinal:  Positive for abdominal pain, constipation, nausea and vomiting. Negative for abdominal distention, anal bleeding, blood in stool, diarrhea and rectal pain.  Genitourinary:  Positive for flank pain. Negative for difficulty urinating, dysuria, frequency, hematuria, vaginal bleeding, vaginal discharge and vaginal pain.  Musculoskeletal:  Negative for back pain and neck pain.  Skin:  Negative for color change and rash.  Neurological:  Negative for dizziness, syncope, light-headedness and headaches.  Psychiatric/Behavioral:  Negative for confusion.    Physical Exam Updated Vital Signs BP (!) 137/101 (BP Location: Right Arm)   Pulse 79   Temp 98.6 F (37 C) (Oral)   Resp 16   Ht 5\' 1"  (1.549 m)   Wt 63.5 kg   SpO2 96%   BMI 26.45 kg/m   Physical Exam Vitals and nursing note reviewed.  Constitutional:      General: She is not in acute distress.    Appearance: She is not ill-appearing, toxic-appearing or diaphoretic.  HENT:     Head: Normocephalic.  Eyes:     General: No scleral icterus.       Right eye: No discharge.        Left eye: No discharge.  Cardiovascular:     Rate and Rhythm: Normal rate.  Pulmonary:     Effort: Pulmonary effort is normal. No tachypnea, bradypnea or respiratory distress.     Breath sounds: Normal breath sounds. No stridor.  Abdominal:     General: Bowel sounds are decreased. There is no  distension. There are no signs of injury.     Palpations: Abdomen is soft. There is no mass or pulsatile mass.     Tenderness: There is no abdominal tenderness. There is no right CVA tenderness, left CVA tenderness, guarding or rebound.     Hernia: There is no hernia in the umbilical area or ventral area.  Musculoskeletal:     Right lower leg: No swelling or tenderness. No edema.     Left lower leg: No swelling or tenderness. No edema.  Skin:    General: Skin is warm and dry.  Neurological:     General: No focal deficit present.     Mental Status: She is alert.  Psychiatric:  Behavior: Behavior is cooperative.    ED Results / Procedures / Treatments   Labs (all labs ordered are listed, but only abnormal results are displayed) Labs Reviewed  COMPREHENSIVE METABOLIC PANEL - Abnormal; Notable for the following components:      Result Value   Glucose, Bld 100 (*)    BUN 5 (*)    All other components within normal limits  CBC - Abnormal; Notable for the following components:   WBC 13.1 (*)    RBC 5.43 (*)    Hemoglobin 17.3 (*)    HCT 49.8 (*)    Platelets 452 (*)    All other components within normal limits  URINALYSIS, ROUTINE W REFLEX MICROSCOPIC - Abnormal; Notable for the following components:   APPearance HAZY (*)    All other components within normal limits  LIPASE, BLOOD  I-STAT BETA HCG BLOOD, ED ([facility], [facility], [facility] ONLY)    EKG None  Radiology CT ABDOMEN PELVIS W CONTRAST  Result Date: 09/07/2020 CLINICAL DATA:  Right lower quadrant abdominal pain EXAM: CT ABDOMEN AND PELVIS WITH CONTRAST TECHNIQUE: Multidetector CT imaging of the abdomen and pelvis was performed using the standard protocol following bolus administration of intravenous contrast. CONTRAST:  100mL OMNIPAQUE IOHEXOL 300 MG/ML  SOLN COMPARISON:  None. FINDINGS: Lower chest: No acute abnormality. Hepatobiliary: Mild hepatic steatosis. No enhancing intrahepatic mass. No intra or extrahepatic biliary ductal  dilation. Cholecystectomy has been performed. Pancreas: Unremarkable Spleen: Unremarkable Adrenals/Urinary Tract: Adrenal glands are unremarkable. Kidneys are normal, without renal calculi, focal lesion, or hydronephrosis. Bladder is unremarkable. Stomach/Bowel: Stomach is within normal limits. Appendix appears normal. No evidence of bowel wall thickening, distention, or inflammatory changes. Vascular/Lymphatic: Mild aortoiliac atherosclerotic calcification. No aortic aneurysm. No pathologic adenopathy within the abdomen and pelvis. Reproductive: Status post hysterectomy. No adnexal masses. Other: No abdominal wall hernia. Musculoskeletal: No acute bone abnormality. No lytic or blastic bone lesion. Degenerative changes are seen within the lumbar spine. IMPRESSION: No acute intra-abdominal pathology identified. Normal appendix. No radiographic explanation for the patient's reported symptoms. Mild hepatic steatosis. Aortic Atherosclerosis (ICD10-I70.0). Electronically Signed   By: Helyn NumbersAshesh  Parikh MD   On: 09/07/2020 00:27    Procedures Procedures   Medications Ordered in ED Medications  iohexol (OMNIPAQUE) 300 MG/ML solution 100 mL (100 mLs Intravenous Contrast Given 09/07/20 0021)    ED Course  I have reviewed the triage vital signs and the nursing notes.  Pertinent labs & imaging results that were available during my care of the patient were reviewed by me and considered in my medical decision making (see chart for details).    MDM Rules/Calculators/A&P                           Alert 55 year old female no acute stress, nontoxic-appearing.  Patient presents with chief complaint of right flank pain that radiates to right lower quadrant.  Patient endorses associated constipation.  Patient states that she has had improvement in pain over the last 12 hours.  At present rates pain 4/10 on pain scale.  Patient reports passing flatus within the last 12 hours.  Abdomen soft, nondistended, nontender.   No guarding, rebound tenderness, or CVA tenderness.  Lab work and CT abdomen and pelvis obtained while patient was in triage.  Pregnancy test negative, Urinalysis shows no signs of dehydration or infection CMP unremarkable Lipase within normal limits CBC shows leukocytosis at 13.1; and hemoconcentration.  CT abdomen pelvis showed no acute intra-abdominal pathology identified.  Mild hepatic steatosis.  Will start patient on MiraLAX.  To follow-up with [facility] [facility] and wellness clinic.  Patient given strict return precautions.  Patient expressed understanding of all instructions and is agreeable with this plan.  Final Clinical Impression(s) / ED Diagnoses Final diagnoses:  Right flank pain  Constipation, unspecified constipation type    Rx / DC Orders ED Discharge Orders          Ordered    polyethylene glycol (MIRALAX / GLYCOLAX) 17 g packet  Daily        09/07/20 1247             Haskel Schroeder, [place] 09/07/20 1400    Tegeler, Canary Brim, MD 09/07/20 [phone]

## 2020-09-07 NOTE — Discharge Instructions (Addendum)
You came to the emergency department today to be evaluated for your abdominal pain and constipation.  Your lab work was reassuring.  The CT scan of your abdomen showed no acute problems.  I have given you prescription for MiraLAX to help with your constipation.  Please take this as prescribed.  Please follow-up with the [place] and wellness clinic.  Get help right away if: Your pain does not go away as soon as your health care provider told you to expect. You cannot stop vomiting. Your pain is only in areas of the abdomen, such as the right side or the left lower portion of the abdomen. Pain on the right side could be caused by appendicitis. You have bloody or black stools, or stools that look like tar. You have severe pain, cramping, or bloating in your abdomen. You have signs of dehydration, such as: Dark urine, very little urine, or no urine. Cracked lips. Dry mouth. Sunken eyes. Sleepiness. Weakness. You have trouble breathing or chest pain.

## 2020-10-23 ENCOUNTER — Ambulatory Visit: Payer: Self-pay | Admitting: Critical Care Medicine

## 2020-10-25 ENCOUNTER — Other Ambulatory Visit: Payer: Self-pay

## 2020-11-06 ENCOUNTER — Encounter (HOSPITAL_COMMUNITY): Payer: No Payment, Other | Admitting: Psychiatry

## 2020-11-23 ENCOUNTER — Other Ambulatory Visit: Payer: Self-pay

## 2020-11-23 ENCOUNTER — Ambulatory Visit (HOSPITAL_COMMUNITY)
Admission: EM | Admit: 2020-11-23 | Discharge: 2020-11-23 | Disposition: A | Discharge status: Home / Self Care | Payer: No Payment, Other

## 2020-11-23 DIAGNOSIS — F319 Bipolar disorder, unspecified: Secondary | ICD-10-CM

## 2020-11-23 NOTE — BH Assessment (Signed)
Pt reports that her sister-in-law told her to come here today for evaluation. Sister-in-law reports that on 10/10/20 a judge found her "incompetent" and now she needs evaluation to get disability. Pt reports she was filing for disability but was having difficulty keeping up with paper work.  TTS informed pt and family that [facility] will not be able to give her documentation she is looking for and that pt will get a crisis assessment. TTS asked pt was she in crisis and sister-in-law said yes reports that pt has no income and has diagnosis of schizophrenia and depression. Pt denies SI, HI, AVH and substance use. Pt reports feeling depressed because she has been under a lot of stress. Pt also reports she needs medications refill. Pt was seen at [facility] for outpatient services.    Pt is routine.

## 2020-11-23 NOTE — ED Notes (Signed)
Pt discharged with  AVS.  AVS reviewed prior to discharge.  Pt alert, oriented, and ambulatory.  Safety maintained.  °

## 2020-11-23 NOTE — Discharge Instructions (Addendum)

## 2020-11-23 NOTE — ED Provider Notes (Signed)
Behavioral Health Urgent Care Medical Screening Exam  Patient Name: Chloe [name] MRN: [id number] Date of Evaluation: 11/23/20 Chief Complaint:   Diagnosis:  Final diagnoses:  Bipolar 1 disorder (HCC)    History of Present illness: Chloe [name] is a 55 y.o. female.  Patient presents voluntarily to [facility] behavioral health for walk-in assessment.  She is accompanied by her sister-in-law, [name], at patient's request sister-in-law named present during assessment.  [name] reports she presents today for an assessment after she applied for disability benefit, court appointed attorney directed her to "have an assessment."  She denies any acute crisis currently.  She has been diagnosed with bipolar disorder.  She has been followed by [facility] behavioral health outpatient, Dr. Donell Beers in the past.  She reports she is no longer covered by health insurance and is now seen by outpatient psychiatry at [facility] behavioral health.  She is compliant with current medications including Lamictal.  Patient is assessed face-to-face by nurse practitioner.  She is seated in assessment area, no acute distress.  She is alert and oriented, pleasant and cooperative during assessment.  She presents with euthymic mood, congruent affect. She denies suicidal and homicidal ideations.  She denies any history of suicide attempts, denies history of self-harm.  She contracts verbally for safety with this Clinical research associate.   She has normal speech and behavior.  She denies both auditory and visual hallucinations.  Patient is able to converse coherently with goal-directed thoughts and no distractibility or preoccupation.  She denies paranoia.  Objectively there is no evidence of psychosis/mania or delusional thinking.  Patient resides in [place], denies access to weapons.  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  She endorses average sleep and appetite.  She denies alcohol or substance  use.  Patient is primarily supported by her brother. Patient offered support and encouragement.  Patient's sister-in-law agrees with treatment plan to include follow-up with established outpatient psychiatry.  Psychiatric Specialty Exam  Presentation  General Appearance:Appropriate for Environment; Casual  Eye Contact:Good  Speech:Clear and Coherent; Normal Rate  Speech Volume:Normal  Handedness:Right   Mood and Affect  Mood:Euthymic  Affect:Appropriate; Congruent   Thought Process  Thought Processes:Coherent; Goal Directed; Linear  Descriptions of Associations:Intact  Orientation:Full (Time, Place and Person)  Thought Content:Logical; WDL    Hallucinations:None  Ideas of Reference:None  Suicidal Thoughts:No  Homicidal Thoughts:No   Sensorium  Memory:Immediate Good; Recent Good; Remote Good  Judgment:Good  Insight:Good   Executive Functions  Concentration:Good  Attention Span:Good  Recall:Good  Fund of Knowledge:Good  Language:Good   Psychomotor Activity  Psychomotor Activity:Normal   Assets  Assets:Communication Skills; Desire for Improvement; Financial Resources/Insurance; Housing; Intimacy; Leisure Time; Physical Health; Resilience; Social Support; Talents/Skills; Transportation   Sleep  Sleep:Good  Number of hours:  No data recorded  Nutritional Assessment (For OBS and FBC admissions only) Has the patient had a weight loss or gain of 10 pounds or more in the last 3 months?: No Has the patient had a decrease in food intake/or appetite?: No Does the patient have dental problems?: No Does the patient have eating habits or behaviors that may be indicators of an eating disorder including binging or inducing vomiting?: No Has the patient recently lost weight without trying?: 0 Has the patient been eating poorly because of a decreased appetite?: 0 Malnutrition Screening Tool Score: 0   Physical Exam: Physical Exam Vitals and nursing  note reviewed.  Constitutional:      Appearance: Normal appearance. She  is well-developed.  HENT:     Head: Normocephalic and atraumatic.     Nose: Nose normal.  Cardiovascular:     Rate and Rhythm: Normal rate.  Pulmonary:     Effort: Pulmonary effort is normal.  Musculoskeletal:        General: Normal range of motion.     Cervical back: Normal range of motion.  Neurological:     Mental Status: She is alert and oriented to person, place, and time.  Psychiatric:        Attention and Perception: Attention and perception normal.        Mood and Affect: Mood and affect normal.        Speech: Speech normal.        Behavior: Behavior normal. Behavior is cooperative.        Thought Content: Thought content normal.        Cognition and Memory: Cognition and memory normal.        Judgment: Judgment normal.   Review of Systems  Constitutional: Negative.   HENT: Negative.    Eyes: Negative.   Respiratory: Negative.    Cardiovascular: Negative.   Gastrointestinal: Negative.   Genitourinary: Negative.   Musculoskeletal: Negative.   Skin: Negative.   Neurological: Negative.   Endo/Heme/Allergies: Negative.   Psychiatric/Behavioral: Negative.    Blood pressure 123/78, pulse 91, temperature 98 F (36.7 C), temperature source Oral, resp. rate 16, SpO2 97 %. There is no height or weight on file to calculate BMI.  Musculoskeletal: Strength & Muscle Tone: within normal limits Gait & Station: normal Patient leans: N/A   [facility] MSE Discharge Disposition for Follow up and Recommendations: Based on my evaluation the patient does not appear to have an emergency medical condition and can be discharged with resources and follow up care in outpatient services for Medication Management and Individual Therapy Patient reviewed with Dr. Nelly Rout. Follow-up with established outpatient psychiatry. Continue current medication.   Lenard Lance, FNP 11/23/2020, 10:45 AM

## 2020-12-06 ENCOUNTER — Other Ambulatory Visit: Payer: Self-pay

## 2020-12-06 ENCOUNTER — Encounter: Payer: Self-pay | Admitting: Critical Care Medicine

## 2020-12-06 ENCOUNTER — Ambulatory Visit: Payer: Self-pay | Attending: Critical Care Medicine | Admitting: Critical Care Medicine

## 2020-12-06 ENCOUNTER — Telehealth (HOSPITAL_COMMUNITY): Payer: Self-pay | Admitting: *Deleted

## 2020-12-06 DIAGNOSIS — F315 Bipolar disorder, current episode depressed, severe, with psychotic features: Secondary | ICD-10-CM

## 2020-12-06 DIAGNOSIS — F17200 Nicotine dependence, unspecified, uncomplicated: Secondary | ICD-10-CM

## 2020-12-06 DIAGNOSIS — E782 Mixed hyperlipidemia: Secondary | ICD-10-CM

## 2020-12-06 DIAGNOSIS — F319 Bipolar disorder, unspecified: Secondary | ICD-10-CM

## 2020-12-06 DIAGNOSIS — Z5181 Encounter for therapeutic drug level monitoring: Secondary | ICD-10-CM

## 2020-12-06 MED ORDER — ESTRADIOL 0.1 MG/24HR TD PTTW
1.0000 | MEDICATED_PATCH | TRANSDERMAL | 3 refills | Status: DC
  Filled 2020-12-06: qty 10, 30d supply, fill #0
  Filled 2021-01-08: qty 8, 24d supply, fill #1
  Filled 2021-03-29: qty 8, 24d supply, fill #0
  Filled 2021-06-13: qty 8, 24d supply, fill #1

## 2020-12-06 MED ORDER — CETIRIZINE HCL 10 MG PO TABS
10.0000 mg | ORAL_TABLET | Freq: Every day | ORAL | 0 refills | Status: DC | PRN
  Filled 2020-12-06: qty 60, 60d supply, fill #0

## 2020-12-06 MED ORDER — LAMOTRIGINE 200 MG PO TABS
ORAL_TABLET | Freq: Two times a day (BID) | ORAL | 2 refills | Status: DC
  Filled 2020-12-06: qty 60, fill #0
  Filled 2020-12-26: qty 60, 30d supply, fill #0

## 2020-12-06 MED ORDER — CARBAMAZEPINE 200 MG PO TABS
200.0000 mg | ORAL_TABLET | Freq: Two times a day (BID) | ORAL | 1 refills | Status: DC
  Filled 2020-12-06: qty 60, 30d supply, fill #0
  Filled 2021-01-08 – 2021-02-07 (×2): qty 60, 30d supply, fill #1

## 2020-12-06 MED ORDER — OMEPRAZOLE 20 MG PO CPDR
20.0000 mg | DELAYED_RELEASE_CAPSULE | Freq: Every day | ORAL | 2 refills | Status: DC
  Filled 2020-12-06 – 2020-12-26 (×2): qty 30, 30d supply, fill #0
  Filled 2021-02-07: qty 30, 30d supply, fill #1
  Filled 2021-03-29: qty 30, 30d supply, fill #0

## 2020-12-06 NOTE — Assessment & Plan Note (Addendum)
Bipolar affective disorder depressed severe but not schizophrenic however psychotic behavior observed  Patient is not currently suicidal  I called the behavioral health center and spoke to nurse practitioner Doran Heater and also the nurse in the outpatient clinic.  They were not able to provide medication management advice over the phone at this visit.  The patient's been on Tegretol in the past with good success and my plan is to start the Tegretol 200 mg twice daily however there is a drug interaction with Lamictal we will have to watch her liver function and renal function along with blood counts closely.  I will obtain baseline liver functions and blood count today she will go to the lab for this.  Note her labs from the summer in July and in October were normal.  Patient was told to keep her November 14 appointment at the outpatient center and as well I spoke to the nurse at the outpatient center they will try to work the patient in sooner and call her.  They were told to call the patient's sister Ms. [name] as she is organized in the patient's care.  Also I told the patient and sister-in-law that they can go to the walk-in clinic Monday through Fridays at 8 AM as well to be seen sooner

## 2020-12-06 NOTE — Assessment & Plan Note (Signed)
Continue to counsel against further smoking

## 2020-12-06 NOTE — Progress Notes (Signed)
New Patient Office Visit  Subjective:  Patient ID: Chloe [name], female    DOB: 02-26-1965  Age: 55 y.o. MRN: [id number] Virtual Visit via Telephone Note  I connected with Chloe [name] on 12/06/20 at  9:00 AM EDT by telephone and verified that I am speaking with the correct person using two identifiers.   Consent:  I discussed the limitations, risks, security and privacy concerns of performing an evaluation and management service by telephone and the availability of in person appointments. I also discussed with the patient that there may be a patient responsible charge related to this service. The patient expressed understanding and agreed to proceed.  Location of patient: Patient is at home  Location of provider: I am in my office  Persons participating in the televisit with the patient.   Chloe [name] the patient's sister-in-law joined the call as a conference call per patient request as she is assisting patient with her medical care and other matters    History of Present Illness: CC:  PCP f/u severe Bipolar disorder  HPI 07/19/20 Chloe [name] presents for PCP to est. This patient is here to establish for primary care.  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  She is in the process of trying to get disability with Medicaid but is also interested in getting financial assistance with the [facility] health program.  Patient has history of bipolar disorder and is followed at the behavioral health center now on Lamictal 200 mg twice daily.  Patient also is taking omega-3 capsules as she has not had access to any statin therapy.  She does have history of hyperlipidemia.  Also history of reflux disease off all medications but having active symptoms.  Patient also history of active tobacco use she is smoking about a pack a day of cigarettes and has tried nicotine patches in the past  which she states partially helped.  Patient has postmenopausal symptoms and is requesting refill on her Vivelle patch.  Patient does have chronic allergies and mild intermittent asthma symptoms for which she uses the albuterol.  The patient is due a tetanus vaccine she will agree to receive this.  Patient has history of lumpectomy for breast cancer in remission this was in 2018 she needs another screening mammogram.  Patient's had a total hysterectomy in the past  Patient is also status postcholecystectomy  Patient did complete 2 COVID-vaccine series with Pfizer in 2021 she is not interested in receiving the booster vaccine  Patient's been less active and has gained weight her BMI is now up to 26.8 but on arrival blood pressure is 124/78  10/19 The patient is seen today by way of a telephone visit.  Visit is accomplished with the patient's sister-in-law Ms. Chloe [name] who participated.  This patient has severe bipolar depressive disorder and has not been improving despite being on Lamictal 200 mg twice daily.  She went to the urgent care in October earlier this month question of suicidality however they screened her out for any psychiatric emergency and discharge the patient without medication adjustments.  Patient was told to keep her upcoming appointment which she has on November 14 at the outpatient clinic.  Patient states she has been on Tegretol in the past and this was beneficial to her.  She is interested in seeing if she can resume this as well.  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  She states at home all the time she does not have organization to her thoughts and is easily triggered with anger.  The patient does maintain her and as needed allergy medications.  Patient is still actively smoking and is trying to cut back with nicotine patches.  Patient does continue her Vivelle patch.  Blood pressure  has not been an issue she does not have diabetes either.  Health screening labs the last visit were negative.  She has normal liver functions normal renal functions.  Patient does take a baby aspirin daily along with omega-3 fish oil.  She does have hypercholesterolemia but were treating this with diet and omega-3 only.  She does take omeprazole for proton pump inhibitor reflux symptoms.  Patient currently is not suicidal  Past Medical History:  Diagnosis Date   Anxiety    Bipolar disorder (HCC)    Breast mass, right    GERD (gastroesophageal reflux disease)    Hyperlipidemia    Post-operative nausea and vomiting    Smoker     Past Surgical History:  Procedure Laterality Date   ABDOMINAL HYSTERECTOMY     had nausea and vomiting post-op   BREAST LUMPECTOMY WITH RADIOACTIVE SEED LOCALIZATION Right 11/12/2016   Procedure: RIGHT BREAST LUMPECTOMY WITH RADIOACTIVE SEED LOCALIZATION;  Surgeon: Harriette Bouillon, MD;  Location:  SURGERY CENTER;  Service: General;  Laterality: Right;   CHOLECYSTECTOMY     LAPAROSCOPIC LYSIS OF ADHESIONS     MYOMECTOMY     PLANTAR FASCIA SURGERY Left     Family History  Problem Relation Age of Onset   Lung cancer Mother    Hyperlipidemia Brother    Colon cancer Neg Hx    Esophageal cancer Neg Hx    Stomach cancer Neg Hx     Social History   Socioeconomic History   Marital status: Divorced    Spouse name: Not on file   Number of children: Not on file   Years of education: Not on file   Highest education level: Not on file  Occupational History   Not on file  Tobacco Use   Smoking status: Some Days    Packs/day: 0.50    Types: Cigarettes   Smokeless tobacco: Never  Vaping Use   Vaping Use: Never used  Substance and Sexual Activity   Alcohol use: Not Currently    Comment: social   Drug use: No   Sexual activity: Not Currently  Other Topics Concern   Not on file  Social History Narrative   Not on file   Social Determinants of  Health   Financial Resource Strain: Not on file  Food Insecurity: Not on file  Transportation Needs: Not on file  Physical Activity: Not on file  Stress: Not on file  Social Connections: Not on file  Intimate Partner Violence: Not on file    ROS Review of Systems  Constitutional:  Positive for fatigue. Negative for chills, diaphoresis and fever.  HENT: Negative.  Negative for congestion, hearing loss, nosebleeds, sore throat and tinnitus.   Eyes:  Negative for photophobia and redness.  Respiratory: Negative.  Negative for cough, shortness of breath, wheezing and stridor.   Cardiovascular: Negative.  Negative for chest pain, palpitations and leg swelling.  Gastrointestinal: Negative.  Negative for abdominal pain, blood in stool, constipation, diarrhea, nausea and vomiting.  Endocrine: Negative for polydipsia.  Genitourinary:  Negative.  Negative for dysuria, flank pain, frequency, hematuria and urgency.  Musculoskeletal: Negative.  Negative for back pain, myalgias and neck pain.  Skin:  Negative for rash.  Allergic/Immunologic: Negative for environmental allergies.  Neurological: Negative.  Negative for dizziness, tremors, seizures, weakness and headaches.  Hematological:  Does not bruise/bleed easily.  Psychiatric/Behavioral:  Positive for behavioral problems, confusion, decreased concentration and dysphoric mood. Negative for hallucinations, self-injury, sleep disturbance and suicidal ideas. The patient is nervous/anxious. The patient is not hyperactive.    Objective:   Today's Vitals: There were no vitals taken for this visit. No exam phone visit   Assessment & Plan:   Problem List Items Addressed This Visit       Other   Bipolar affective disorder, depressed, severe, with psychotic behavior (HCC) - Primary    Bipolar affective disorder depressed severe but not schizophrenic however psychotic behavior observed  Patient is not currently suicidal  I called the behavioral  health center and spoke to nurse practitioner Doran Heater and also the nurse in the outpatient clinic.  They were not able to provide medication management advice over the phone at this visit.  The patient's been on Tegretol in the past with good success and my plan is to start the Tegretol 200 mg twice daily however there is a drug interaction with Lamictal we will have to watch her liver function and renal function along with blood counts closely.  I will obtain baseline liver functions and blood count today she will go to the lab for this.  Note her labs from the summer in July and in October were normal.  Patient was told to keep her November 14 appointment at the outpatient center and as well I spoke to the nurse at the outpatient center they will try to work the patient in sooner and call her.  They were told to call the patient's sister Ms. Chloe [name] as she is organized in the patient's care.  Also I told the patient and sister-in-law that they can go to the walk-in clinic Monday through Fridays at 8 AM as well to be seen sooner      Relevant Orders   CBC with Differential/Platelet   Comprehensive metabolic panel   Bipolar 1 disorder (HCC)   Relevant Medications   lamoTRIgine (LAMICTAL) 200 MG tablet   Mixed hyperlipidemia    Mild hyperlipidemia patient continue omega-3 and healthy diet      Tobacco dependency    Continue to counsel against further smoking      Other Visit Diagnoses     Therapeutic drug monitoring       Relevant Orders   CBC with Differential/Platelet   Comprehensive metabolic panel      Outpatient Encounter Medications as of 12/06/2020  Medication Sig   albuterol (PROAIR HFA) 108 (90 Base) MCG/ACT inhaler INHALE 2 PUFFS EVERY 4-6 HOURS AS NEEDED   aspirin EC 81 MG tablet Take 81 mg by mouth daily. Swallow whole.   carbamazepine (TEGRETOL) 200 MG tablet Take 1 tablet (200 mg total) by mouth 2 (two) times daily.   nicotine polacrilex (NICORETTE MINI) 4 MG lozenge  Take one three times daily   Omega-3 1000 MG CAPS Take 1 capsule by mouth daily.   polyethylene glycol (MIRALAX / GLYCOLAX) 17 g packet Take 17 g by mouth daily.   [DISCONTINUED] cetirizine (ZYRTEC ALLERGY) 10 MG tablet Take 1 tablet (10 mg total) by mouth daily.   [DISCONTINUED] estradiol (VIVELLE-DOT) 0.1 MG/24HR patch Place 1 patch (  0.1 mg total) onto the skin every 3 (three) days.   [DISCONTINUED] lamoTRIgine (LAMICTAL) 200 MG tablet TAKE 1 TABLET (200 MG TOTAL) BY MOUTH 2 (TWO) TIMES DAILY.   [DISCONTINUED] omeprazole (PRILOSEC) 20 MG capsule Take 1 capsule (20 mg total) by mouth daily.   cetirizine (ZYRTEC ALLERGY) 10 MG tablet Take 1 tablet (10 mg total) by mouth daily as needed for allergies.   estradiol (VIVELLE-DOT) 0.1 MG/24HR patch Place 1 patch (0.1 mg total) onto the skin every 3 (three) days.   lamoTRIgine (LAMICTAL) 200 MG tablet TAKE 1 TABLET (200 MG TOTAL) BY MOUTH 2 (TWO) TIMES DAILY.   omeprazole (PRILOSEC) 20 MG capsule Take 1 capsule (20 mg total) by mouth daily.   No facility-administered encounter medications on file as of 12/06/2020.    Follow-up: Return in about 3 weeks (around 12/27/2020).  Follow Up Instructions: Patient knows a flu vaccine visit will be arranged refills on medicine sent to our pharmacy follow-up appointment will be made with me within the next 6 weeks.  Also I will be in contact with her mental health provider regarding her condition to see if additional medication adjustments can be made or else get her in for medication management as soon as possible as I do not see an existing appointment Patient given information as to how to achieve a mammogram I discussed the assessment and treatment plan with the patient. The patient was provided an opportunity to ask questions and all were answered. The patient agreed with the plan and demonstrated an understanding of the instructions.   The patient was advised to call back or seek an in-person evaluation if  the symptoms worsen or if the condition fails to improve as anticipated.  I provided 31 minutes of non-face-to-face time during this encounter  including  median intraservice time , review of notes, labs, imaging, medications  and explaining diagnosis and management to the patient .       Shan Levans, MD

## 2020-12-06 NOTE — Assessment & Plan Note (Signed)
Mild hyperlipidemia patient continue omega-3 and healthy diet

## 2020-12-06 NOTE — Telephone Encounter (Signed)
Spoke to Dr. Luisa Hart (primary care)--stated already spoken to [facility] regarding the pt, but needed appt sooner than Nov.14 with Dr. Kathlene November and gave the information  Pt sister -in -law Abran Cantor) [phone] to assist the with schedule--advise the sister-in-law can bring the pt for a walk-in appt on Monday and Tuesday from 8;00-11:00 first come first serve only, if pt needed to be seen sooner. Pt sister-in-law understood without questions.

## 2020-12-06 NOTE — Patient Instructions (Signed)
Call the cancer control program to apply for free mammogram and pap smear, they will order the studies once approved The number is:   [phone]

## 2020-12-07 ENCOUNTER — Other Ambulatory Visit: Payer: Self-pay

## 2020-12-08 ENCOUNTER — Other Ambulatory Visit: Payer: Self-pay

## 2020-12-15 ENCOUNTER — Other Ambulatory Visit: Payer: Self-pay

## 2020-12-15 ENCOUNTER — Ambulatory Visit: Payer: Self-pay | Attending: Critical Care Medicine

## 2020-12-15 DIAGNOSIS — Z23 Encounter for immunization: Secondary | ICD-10-CM

## 2020-12-15 DIAGNOSIS — F315 Bipolar disorder, current episode depressed, severe, with psychotic features: Secondary | ICD-10-CM

## 2020-12-15 DIAGNOSIS — Z5181 Encounter for therapeutic drug level monitoring: Secondary | ICD-10-CM

## 2020-12-16 LAB — COMPREHENSIVE METABOLIC PANEL
ALT: 38 IU/L — ABNORMAL HIGH (ref 0–32)
AST: 22 IU/L (ref 0–40)
Albumin/Globulin Ratio: 1.8 (ref 1.2–2.2)
Albumin: 4.7 g/dL (ref 3.8–4.9)
Alkaline Phosphatase: 130 IU/L — ABNORMAL HIGH (ref 44–121)
BUN/Creatinine Ratio: 14 (ref 9–23)
BUN: 8 mg/dL (ref 6–24)
Bilirubin Total: 0.3 mg/dL (ref 0.0–1.2)
CO2: 23 mmol/L (ref 20–29)
Calcium: 9.8 mg/dL (ref 8.7–10.2)
Chloride: 100 mmol/L (ref 96–106)
Creatinine, Ser: 0.56 mg/dL — ABNORMAL LOW (ref 0.57–1.00)
Globulin, Total: 2.6 g/dL (ref 1.5–4.5)
Glucose: 97 mg/dL (ref 70–99)
Potassium: 4.4 mmol/L (ref 3.5–5.2)
Sodium: 140 mmol/L (ref 134–144)
Total Protein: 7.3 g/dL (ref 6.0–8.5)
eGFR: 108 mL/min/{1.73_m2} (ref >59)

## 2020-12-16 LAB — CBC WITH DIFFERENTIAL/PLATELET
Basophils Absolute: 0.1 10*3/uL (ref 0.0–0.2)
Basos: 1 %
EOS (ABSOLUTE): 0.4 10*3/uL (ref 0.0–0.4)
Eos: 3 %
Hematocrit: 47.5 % — ABNORMAL HIGH (ref 34.0–46.6)
Hemoglobin: 16.6 g/dL — ABNORMAL HIGH (ref 11.1–15.9)
Immature Grans (Abs): 0 10*3/uL (ref 0.0–0.1)
Immature Granulocytes: 0 %
Lymphocytes Absolute: 4.2 10*3/uL — ABNORMAL HIGH (ref 0.7–3.1)
Lymphs: 38 %
MCH: 31.1 pg (ref 26.6–33.0)
MCHC: 34.9 g/dL (ref 31.5–35.7)
MCV: 89 fL (ref 79–97)
Monocytes Absolute: 0.8 10*3/uL (ref 0.1–0.9)
Monocytes: 7 %
Neutrophils Absolute: 5.6 10*3/uL (ref 1.4–7.0)
Neutrophils: 51 %
Platelets: 439 10*3/uL (ref 150–450)
RBC: 5.34 x10E6/uL — ABNORMAL HIGH (ref 3.77–5.28)
RDW: 12 % (ref 11.7–15.4)
WBC: 11.1 10*3/uL — ABNORMAL HIGH (ref 3.4–10.8)

## 2020-12-18 ENCOUNTER — Telehealth: Payer: Self-pay | Admitting: Critical Care Medicine

## 2020-12-18 NOTE — Telephone Encounter (Signed)
-----   Message from Storm Frisk, MD sent at 12/16/2020  6:39 AM EDT ----- Let pt know kidney liver blood counts normal

## 2020-12-18 NOTE — Telephone Encounter (Signed)
Pt was called and vm was left. Information was sent to the nurse pool. 

## 2020-12-26 ENCOUNTER — Other Ambulatory Visit: Payer: Self-pay

## 2021-01-01 ENCOUNTER — Ambulatory Visit (INDEPENDENT_AMBULATORY_CARE_PROVIDER_SITE_OTHER): Payer: No Payment, Other | Admitting: Psychiatry

## 2021-01-01 ENCOUNTER — Other Ambulatory Visit: Payer: Self-pay

## 2021-01-01 ENCOUNTER — Encounter (HOSPITAL_COMMUNITY): Payer: Self-pay | Admitting: Psychiatry

## 2021-01-01 DIAGNOSIS — F319 Bipolar disorder, unspecified: Secondary | ICD-10-CM | POA: Diagnosis not present

## 2021-01-01 MED ORDER — LAMOTRIGINE 200 MG PO TABS
ORAL_TABLET | Freq: Two times a day (BID) | ORAL | 3 refills | Status: DC
  Filled 2021-01-01: qty 60, fill #0
  Filled 2021-01-24: qty 60, 30d supply, fill #0
  Filled 2021-02-27: qty 60, 30d supply, fill #1
  Filled 2021-02-27 (×2): qty 60, 30d supply, fill #0

## 2021-01-01 MED ORDER — CARBAMAZEPINE ER 100 MG PO TB12
100.0000 mg | ORAL_TABLET | Freq: Two times a day (BID) | ORAL | 3 refills | Status: DC
  Filled 2021-01-01: qty 60, 30d supply, fill #0

## 2021-01-01 NOTE — Progress Notes (Signed)
BH MD/PA/NP OP Progress Note  01/01/2021 4:10 PM Chloe [name]  MRN:  [id number]  Chief Complaint: "  Dr. [name] started me on Tegretol and I am feeling better" Chief Complaint   Medication Management     HPI: 55 year old female seen today for follow-up psychiatric evaluation.  She is a former patient of Dr. Jerilynn Mages Dr. [facility] who is being transferred to writer for medication management.  She has a psychiatric history of bipolar affective disorder and tobacco dependence.  She is currently managed on Lamictal 200 mg twice daily, Nicorette mini 4 mg, and Tegretol 200 mg 2 times daily (prescribed by PCP). Patient was recently [facility] where she presented after being directed to have an assessment done by her disability hearing board.  At that time his medications were not adjusted. She notes Tegretol made her feel dizzy and reports that she reduced her dose to 100 mg twice a day.  She informed Probation officer that she does not take the Nicorette gum but reports her other medications and combinations are effective in managing her psychiatric condition.  Patient also reports that she has been take Estradiol which is prescribed by her OB/GYN and notes that it is drastically improved her mood.  Today she is well-groomed, pleasant, engaged in conversation, and maintains eye contact. Patient informed Probation officer that she has been on Tegretol for the last few weeks.  She notes that it helps stabilize her mood as she was more irritable, distractible, and having racing thoughts.  She notes now her mood is stable and denies symptoms of mania.  She informed Probation officer that her anxiety and depression are well managed.  Provider conducted a GAD-7 and patient scored a 9.  She notes that at times she worries about her finances and her disability.  She also informed Probation officer at times she becomes antsy.  She notes that she does not have a car and is unemployed.  She informed Probation officer that she tries to stay active at home and play  with her animal however notes that at times this becomes boring.  Provider recommended patient looking up free activities in her area.  She endorsed understanding and agreed.. She informed Probation officer of her she was directed to have an assessment for her disability hearing and requested provider print off her assessment from today.  Provider was agreeable to this request.  Patient notes that her appetite has increased and notes that she has gained over 20 pounds in the last 6 months.  She endorses adequate sleep.  Today she denies SI/HI/VAH, mania, paranoia.  Patient informed writer that at times she is in pain.  She notes that she has hip pain, knee pain, and back pain.  She informed Probation officer that she plans to follow-up with Dr. [name] to help manage this pain.  Patient was seen with her sister-in-law who notes that her mood has been more stable on his current regimen of medication.  Patient request that Tegretol be refilled which provider was agreeable to.  At this time Tegretol reduced to 100 mg twice a day.  Provider informed patient that the recommended dose is 400 mg daily however notes that the dose could be reduced.  Provider informed to discuss with Dr. [name] these changes.  She endorsed understanding and agreed.  She will continue her other medications as prescribed.  Patient referred to outpatient counseling for therapy.  No other concerns noted at this time.  Visit Diagnosis:    ICD-10-CM   1. Bipolar 1  disorder (HCC)  F31.9 lamoTRIgine (LAMICTAL) 200 MG tablet    carbamazepine (TEGRETOL-XR) 100 MG 12 hr tablet    Ambulatory referral to Social Work      Past Psychiatric History: bipolar affective disorder and tobacco dependence, several psychiatric hospitalizations in the past. She was last admitted 25 November 2017 and was noted to have psychotic symptoms along with manic features during that hospital stay. She was discharged on risperidone, carbamazepine and trazodone at that time.  Past  Medical History:  Past Medical History:  Diagnosis Date   Anxiety    Bipolar disorder (HCC)    Breast mass, right    GERD (gastroesophageal reflux disease)    Hyperlipidemia    Post-operative nausea and vomiting    Smoker     Past Surgical History:  Procedure Laterality Date   ABDOMINAL HYSTERECTOMY     had nausea and vomiting post-op   BREAST LUMPECTOMY WITH RADIOACTIVE SEED LOCALIZATION Right 11/12/2016   Procedure: RIGHT BREAST LUMPECTOMY WITH RADIOACTIVE SEED LOCALIZATION;  Surgeon: Harriette Bouillon, MD;  Location: [facility];  Service: General;  Laterality: Right;   CHOLECYSTECTOMY     LAPAROSCOPIC LYSIS OF ADHESIONS     MYOMECTOMY     PLANTAR FASCIA SURGERY Left     Family Psychiatric History: Unknown  Family History:  Family History  Problem Relation Age of Onset   Lung cancer Mother    Hyperlipidemia Brother    Colon cancer Neg Hx    Esophageal cancer Neg Hx    Stomach cancer Neg Hx     Social History:  Social History   Socioeconomic History   Marital status: Divorced    Spouse name: Not on file   Number of children: Not on file   Years of education: Not on file   Highest education level: Not on file  Occupational History   Not on file  Tobacco Use   Smoking status: Some Days    Packs/day: 0.50    Types: Cigarettes   Smokeless tobacco: Never  Vaping Use   Vaping Use: Never used  Substance and Sexual Activity   Alcohol use: Not Currently    Comment: social   Drug use: No   Sexual activity: Not Currently  Other Topics Concern   Not on file  Social History Narrative   Not on file   Social Determinants of Health   Financial Resource Strain: Not on file  Food Insecurity: Not on file  Transportation Needs: Not on file  Physical Activity: Not on file  Stress: Not on file  Social Connections: Not on file    Allergies:  Allergies  Allergen Reactions   Penicillins Hives    Has patient had a PCN reaction causing immediate rash,  facial/tongue/throat swelling, SOB or lightheadedness with hypotension: Yes Has patient had a PCN reaction causing severe rash involving mucus membranes or skin necrosis: No Has patient had a PCN reaction that required hospitalization: No Has patient had a PCN reaction occurring within the last 10 years: Yes If all of the above answers are "NO", then may proceed with Cephalosporin use.   Demerol [Meperidine]     Metabolic Disorder Labs: Lab Results  Component Value Date   HGBA1C 5.2 11/30/2017   MPG 102.54 11/30/2017   Lab Results  Component Value Date   PROLACTIN 12.4 11/30/2017   Lab Results  Component Value Date   CHOL 220 (H) 07/19/2020   TRIG 134 07/19/2020   HDL 54 07/19/2020   CHOLHDL 4.1 07/19/2020  VLDL 18 11/30/2017   LDLCALC 142 (H) 07/19/2020   LDLCALC 100 (H) 11/30/2017   Lab Results  Component Value Date   TSH 1.290 07/19/2020   TSH 1.717 11/30/2017    Therapeutic Level Labs: No results found for: LITHIUM No results found for: VALPROATE No components found for:  CBMZ  Current Medications: Current Outpatient Medications  Medication Sig Dispense Refill   albuterol (PROAIR HFA) 108 (90 Base) MCG/ACT inhaler INHALE 2 PUFFS EVERY 4-6 HOURS AS NEEDED 18 g 0   aspirin EC 81 MG tablet Take 81 mg by mouth daily. Swallow whole.     carbamazepine (TEGRETOL) 200 MG tablet Take 1 tablet (200 mg total) by mouth 2 (two) times daily. 60 tablet 1   carbamazepine (TEGRETOL-XR) 100 MG 12 hr tablet Take 1 tablet (100 mg total) by mouth 2 (two) times daily. 60 tablet 3   cetirizine (ZYRTEC ALLERGY) 10 MG tablet Take 1 tablet (10 mg total) by mouth daily as needed for allergies. 60 tablet 0   estradiol (VIVELLE-DOT) 0.1 MG/24HR patch Place 1 patch (0.1 mg total) onto the skin every 3 (three) days. 10 patch 3   nicotine polacrilex (NICORETTE MINI) 4 MG lozenge Take one three times daily 100 tablet 4   Omega-3 1000 MG CAPS Take 1 capsule by mouth daily.     omeprazole  (PRILOSEC) 20 MG capsule Take 1 capsule (20 mg total) by mouth daily. 30 capsule 2   polyethylene glycol (MIRALAX / GLYCOLAX) 17 g packet Take 17 g by mouth daily. 14 each 0   lamoTRIgine (LAMICTAL) 200 MG tablet TAKE 1 TABLET (200 MG TOTAL) BY MOUTH 2 (TWO) TIMES DAILY. 60 tablet 3   No current facility-administered medications for this visit.     Musculoskeletal: Strength & Muscle Tone: within normal limits Gait & Station: normal Patient leans: N/A  Psychiatric Specialty Exam: Review of Systems  Blood pressure (!) 144/79, pulse 69, height 5\' 1"  (1.549 m), weight 151 lb (68.5 kg).Body mass index is 28.53 kg/m.  General Appearance: Well Groomed  Eye Contact:  Good  Speech:  Clear and Coherent and Normal Rate  Volume:  Normal  Mood:  Euthymic  Affect:  Appropriate and Congruent  Thought Process:  Coherent, Goal Directed, and Linear  Orientation:  Full (Time, Place, and Person)  Thought Content: WDL and Logical   Suicidal Thoughts:  No  Homicidal Thoughts:  No  Memory:  Immediate;   Good Recent;   Good Remote;   Good  Judgement:  Good  Insight:  Good  Psychomotor Activity:  Normal  Concentration:  Concentration: Good and Attention Span: Good  Recall:  Good  Fund of Knowledge: Good  Language: Good  Akathisia:  No  Handed:  Right  AIMS (if indicated): not done  Assets:  Communication Skills Desire for Improvement Housing Leisure Time Physical Health Social Support  ADL's:  Intact  Cognition: WNL  Sleep:  Good   Screenings: AUDIT    Flowsheet Row [facility]  Alcohol Use Disorder Identification Test Final Score (AUDIT) 0      GAD-7    Flowsheet Row [facility]  Total GAD-7 Score 9 13      PHQ2-9    Flowsheet Row [facility] Clinical Support from 04/12/2020 in [facility] ED from 03/16/2020 in [facility]  PHQ-2 Total Score 1 2 2 2   PHQ-9 Total Score 8 11 8 7       Flowsheet Row ED from 09/06/2020 in [facility] from 04/12/2020 in [facility] ED from 03/16/2020 in [place] No Risk No Risk No Risk        Assessment and Plan: Patient notes that her mood is stable on her current dose of Lamictal and Tegretol. Patient request that Tegretol be refilled which provider was agreeable to.  At this time Tegretol reduced to 100 mg twice a day as she reports that 400 mg made her feel dizzy.  Provider informed patient that the recommended dose is 400 mg daily however notes that the dose could be reduced.  Provider informed to discuss with Dr. [name] these changes.  She endorsed understanding and agreed.  She will continue her other medications as prescribed.  Patient referred to outpatient counseling for therapy.   1. Bipolar 1 disorder (HCC)  Continue- lamoTRIgine (LAMICTAL) 200 MG tablet; TAKE 1 TABLET (200 MG TOTAL) BY MOUTH 2 (TWO) TIMES DAILY.  Dispense: 60 tablet; Refill: 3 Reduced- carbamazepine (TEGRETOL-XR) 100 MG 12 hr tablet; Take 1 tablet (100 mg total) by mouth 2 (two) times daily.  Dispense: 60 tablet; Refill: 3 - Ambulatory referral to Social Work  Follow-up in 1-month Follow-up with therapy  Salley Slaughter, NP 01/01/2021, 4:10 PM

## 2021-01-08 ENCOUNTER — Other Ambulatory Visit: Payer: Self-pay

## 2021-01-17 ENCOUNTER — Other Ambulatory Visit: Payer: Self-pay

## 2021-01-17 ENCOUNTER — Ambulatory Visit (INDEPENDENT_AMBULATORY_CARE_PROVIDER_SITE_OTHER): Payer: No Payment, Other | Admitting: Licensed Clinical Social Worker

## 2021-01-17 DIAGNOSIS — F319 Bipolar disorder, unspecified: Secondary | ICD-10-CM

## 2021-01-24 ENCOUNTER — Other Ambulatory Visit: Payer: Self-pay

## 2021-01-26 ENCOUNTER — Other Ambulatory Visit: Payer: Self-pay
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
## 2021-02-07 ENCOUNTER — Other Ambulatory Visit: Payer: Self-pay

## 2021-02-08 ENCOUNTER — Other Ambulatory Visit: Payer: Self-pay

## 2021-02-27 ENCOUNTER — Other Ambulatory Visit: Payer: Self-pay

## 2021-02-28 ENCOUNTER — Other Ambulatory Visit: Payer: Self-pay

## 2021-03-28 ENCOUNTER — Other Ambulatory Visit: Payer: Self-pay

## 2021-03-28 ENCOUNTER — Ambulatory Visit (INDEPENDENT_AMBULATORY_CARE_PROVIDER_SITE_OTHER): Payer: No Payment, Other | Admitting: Licensed Clinical Social Worker

## 2021-03-28 ENCOUNTER — Telehealth (INDEPENDENT_AMBULATORY_CARE_PROVIDER_SITE_OTHER): Payer: No Payment, Other | Admitting: Psychiatry

## 2021-03-28 ENCOUNTER — Encounter (HOSPITAL_COMMUNITY): Payer: Self-pay | Admitting: Psychiatry

## 2021-03-28 DIAGNOSIS — F319 Bipolar disorder, unspecified: Secondary | ICD-10-CM

## 2021-03-28 MED ORDER — LAMOTRIGINE 200 MG PO TABS
ORAL_TABLET | Freq: Two times a day (BID) | ORAL | 3 refills | Status: DC
  Filled 2021-03-28: qty 60, 30d supply, fill #0
  Filled 2021-05-03 – 2021-06-01 (×2): qty 60, 30d supply, fill #1
  Filled 2021-07-10: qty 60, 30d supply, fill #2

## 2021-03-28 MED ORDER — CARBAMAZEPINE ER 100 MG PO TB12
100.0000 mg | ORAL_TABLET | Freq: Two times a day (BID) | ORAL | 3 refills | Status: DC
  Filled 2021-03-28: qty 60, 30d supply, fill #0
  Filled 2021-05-03 – 2021-05-11 (×2): qty 60, 30d supply, fill #1
  Filled 2021-06-01: qty 60, 30d supply, fill #2
  Filled 2021-07-19: qty 60, 30d supply, fill #3

## 2021-03-28 NOTE — Progress Notes (Signed)
BH MD/PA/NP OP Progress Note Virtual Visit via Video Note  I connected with Chloe [name] on 03/28/21 at 11:30 AM EST by a video enabled telemedicine application and verified that I am speaking with the correct person using two identifiers.  Location: Patient: Home Provider: Clinic   I discussed the limitations of evaluation and management by telemedicine and the availability of in person appointments. The patient expressed understanding and agreed to proceed.  I provided 30 minutes of non-face-to-face time during this encounter.   03/28/2021 12:31 PM Chloe [name]  MRN:  [id number]  Chief Complaint: "I still have a lot going on and working on disability"    HPI: 56 year old female seen today for follow-up psychiatric evaluation.  She has a psychiatric history of bipolar affective disorder and tobacco dependence.  She is currently managed on Lamictal 200 mg twice daily and Tegretol 100 mg 2 times (higher doses makes dizzy) nightly.  She reports her medications are effective in managing her psychiatric conditions.  Today she is well-groomed, pleasant, engaged in conversation, and maintains eye contact.  She informed Clinical research associate that she still has a lot going on and is working on her disability.  She however notes that her mood is stable and reports that she has minimal anxiety and depression.  She notes that she copes with her anxiety and depression by going to counseling, cooking, and spending time with family.  She also notes that she tries to do a puzzle.  Today provider conducted a GAD-7 and patient scored a 6, at her last visit she scored a 9.  A PHQ-9 was conducted by patient's counselor and patient scored a 1.  She endorses adequate sleep and appetite.  Today she denies SI/HI/VAH, mania, paranoia.    Patient informed Clinical research associate that she quit smoking for a few months however notes that she recently restarted smoking last week.  She notes that she no longer takes her NicoDerm gum.     Patient reports that she continues to have hip, knee, and back pain however notes that it is being managed by Dr. Delford Field at [facility].  At this time no medication changes made.  Patient agreeable to continue her medications as prescribed and will follow-up with outpatient counseling for therapy.   No other concerns noted at this time.  Visit Diagnosis:    ICD-10-CM   1. Bipolar 1 disorder (HCC)  F31.9 carbamazepine (TEGRETOL-XR) 100 MG 12 hr tablet    lamoTRIgine (LAMICTAL) 200 MG tablet      Past Psychiatric History: bipolar affective disorder and tobacco dependence, several psychiatric hospitalizations in the past. She was last admitted 25 November 2017 and was noted to have psychotic symptoms along with manic features during that hospital stay. She was discharged on risperidone, carbamazepine and trazodone at that time.  Past Medical History:  Past Medical History:  Diagnosis Date   Anxiety    Bipolar disorder (HCC)    Breast mass, right    GERD (gastroesophageal reflux disease)    Hyperlipidemia    Post-operative nausea and vomiting    Smoker     Past Surgical History:  Procedure Laterality Date   ABDOMINAL HYSTERECTOMY     had nausea and vomiting post-op   BREAST LUMPECTOMY WITH RADIOACTIVE SEED LOCALIZATION Right 11/12/2016   Procedure: RIGHT BREAST LUMPECTOMY WITH RADIOACTIVE SEED LOCALIZATION;  Surgeon: Harriette Bouillon, MD;  Location:  SURGERY CENTER;  Service: General;  Laterality: Right;   CHOLECYSTECTOMY     LAPAROSCOPIC LYSIS OF  ADHESIONS     MYOMECTOMY     PLANTAR FASCIA SURGERY Left     Family Psychiatric History: Unknown  Family History:  Family History  Problem Relation Age of Onset   Lung cancer Mother    Hyperlipidemia Brother    Colon cancer Neg Hx    Esophageal cancer Neg Hx    Stomach cancer Neg Hx     Social History:  Social History   Socioeconomic History   Marital status: Divorced    Spouse name: Not on file    Number of children: Not on file   Years of education: Not on file   Highest education level: Not on file  Occupational History   Not on file  Tobacco Use   Smoking status: Some Days    Packs/day: 0.50    Types: Cigarettes   Smokeless tobacco: Never  Vaping Use   Vaping Use: Never used  Substance and Sexual Activity   Alcohol use: Not Currently    Comment: social   Drug use: No   Sexual activity: Not Currently  Other Topics Concern   Not on file  Social History Narrative   Not on file   Social Determinants of Health   Financial Resource Strain: Not on file  Food Insecurity: Not on file  Transportation Needs: Not on file  Physical Activity: Not on file  Stress: Not on file  Social Connections: Not on file    Allergies:  Allergies  Allergen Reactions   Penicillins Hives    Has patient had a PCN reaction causing immediate rash, facial/tongue/throat swelling, SOB or lightheadedness with hypotension: Yes Has patient had a PCN reaction causing severe rash involving mucus membranes or skin necrosis: No Has patient had a PCN reaction that required hospitalization: No Has patient had a PCN reaction occurring within the last 10 years: Yes If all of the above answers are "NO", then may proceed with Cephalosporin use.   Demerol [Meperidine]     Metabolic Disorder Labs: Lab Results  Component Value Date   HGBA1C 5.2 11/30/2017   MPG 102.54 11/30/2017   Lab Results  Component Value Date   PROLACTIN 12.4 11/30/2017   Lab Results  Component Value Date   CHOL 220 (H) 07/19/2020   TRIG 134 07/19/2020   HDL 54 07/19/2020   CHOLHDL 4.1 07/19/2020   VLDL 18 11/30/2017   LDLCALC 142 (H) 07/19/2020   LDLCALC 100 (H) 11/30/2017   Lab Results  Component Value Date   TSH 1.290 07/19/2020   TSH 1.717 11/30/2017    Therapeutic Level Labs: No results found for: LITHIUM No results found for: VALPROATE No components found for:  CBMZ  Current Medications: Current  Outpatient Medications  Medication Sig Dispense Refill   albuterol (PROAIR HFA) 108 (90 Base) MCG/ACT inhaler INHALE 2 PUFFS EVERY 4-6 HOURS AS NEEDED 18 g 0   aspirin EC 81 MG tablet Take 81 mg by mouth daily. Swallow whole.     carbamazepine (TEGRETOL-XR) 100 MG 12 hr tablet Take 1 tablet (100 mg total) by mouth 2 (two) times daily. 60 tablet 3   cetirizine (ZYRTEC ALLERGY) 10 MG tablet Take 1 tablet (10 mg total) by mouth daily as needed for allergies. 60 tablet 0   estradiol (VIVELLE-DOT) 0.1 MG/24HR patch Place 1 patch (0.1 mg total) onto the skin every 3 (three) days. 10 patch 3   lamoTRIgine (LAMICTAL) 200 MG tablet TAKE 1 TABLET (200 MG TOTAL) BY MOUTH 2 (TWO) TIMES DAILY. 60 tablet 3  nicotine polacrilex (NICORETTE MINI) 4 MG lozenge Take one three times daily 100 tablet 4   Omega-3 1000 MG CAPS Take 1 capsule by mouth daily.     omeprazole (PRILOSEC) 20 MG capsule Take 1 capsule (20 mg total) by mouth daily. 30 capsule 2   polyethylene glycol (MIRALAX / GLYCOLAX) 17 g packet Take 17 g by mouth daily. 14 each 0   No current facility-administered medications for this visit.     Musculoskeletal: Strength & Muscle Tone: within normal limits, telehealth visit Gait & Station: normal, telehealth visit Patient leans: N/A  Psychiatric Specialty Exam: Review of Systems  There were no vitals taken for this visit.There is no height or weight on file to calculate BMI.  General Appearance: Well Groomed  Eye Contact:  Good  Speech:  Clear and Coherent and Normal Rate  Volume:  Normal  Mood:  Euthymic  Affect:  Appropriate and Congruent  Thought Process:  Coherent, Goal Directed, and Linear  Orientation:  Full (Time, Place, and Person)  Thought Content: WDL and Logical   Suicidal Thoughts:  No  Homicidal Thoughts:  No  Memory:  Immediate;   Good Recent;   Good Remote;   Good  Judgement:  Good  Insight:  Good  Psychomotor Activity:  Normal  Concentration:  Concentration: Good and  Attention Span: Good  Recall:  Good  Fund of Knowledge: Good  Language: Good  Akathisia:  No  Handed:  Right  AIMS (if indicated): not done  Assets:  Communication Skills Desire for Improvement Housing Leisure Time Physical Health Social Support  ADL's:  Intact  Cognition: WNL  Sleep:  Good   Screenings: AUDIT    Flowsheet Row [facility]  Alcohol Use Disorder Identification Test Final Score (AUDIT) 0      GAD-7    Flowsheet Row Video Visit from 03/28/2021 in [facility] Clinical Support from 01/01/2021 in [facility] Office Visit from 07/19/2020 in [facility]  Total GAD-7 Score 6 9 13       PHQ2-9    Flowsheet Row Counselor from 03/28/2021 in [facility] Counselor from 01/17/2021 in [facility] Clinical Support from 01/01/2021 in [facility] Office Visit from 07/19/2020 in [facility] Clinical Support from 04/12/2020 in [facility]  PHQ-2 Total Score 1 2 1 2 2   PHQ-9 Total Score -- 7 8 11 8       Flowsheet Row Counselor from 01/17/2021 in [facility] ED from 09/06/2020 in [facility] EMERGENCY DEPARTMENT Clinical Support from 04/12/2020 in [facility]  C-SSRS RISK CATEGORY Low Risk No Risk No Risk        Assessment and Plan: Patient notes that she is doing well on her current medication regimen.At this time no medication changes made.  Patient agreeable to continue her medications as prescribed   1. Bipolar 1 disorder (HCC)  Continue- lamoTRIgine (LAMICTAL) 200 MG tablet; TAKE 1 TABLET (200 MG TOTAL) BY MOUTH 2 (TWO) TIMES DAILY.  Dispense: 60 tablet; Refill: 3 Continue- carbamazepine (TEGRETOL-XR)  100 MG 12 hr tablet; Take 1 tablet (100 mg total) by mouth 2 (two) times daily.  Dispense: 60 tablet; Refill: 3   Follow-up in 46-month Follow-up with therapy  04/14/2020, NP 03/28/2021, 12:31 PM

## 2021-03-29 ENCOUNTER — Other Ambulatory Visit: Payer: Self-pay

## 2021-03-30 ENCOUNTER — Other Ambulatory Visit: Payer: Self-pay

## 2021-04-02 ENCOUNTER — Other Ambulatory Visit: Payer: Self-pay
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
## 2021-04-25 ENCOUNTER — Telehealth (HOSPITAL_COMMUNITY): Payer: Self-pay | Admitting: Licensed Clinical Social Worker

## 2021-04-25 ENCOUNTER — Ambulatory Visit (HOSPITAL_COMMUNITY): Payer: No Payment, Other | Admitting: Licensed Clinical Social Worker

## 2021-04-25 ENCOUNTER — Encounter (HOSPITAL_COMMUNITY): Payer: Self-pay

## 2021-04-25 NOTE — Telephone Encounter (Signed)
LCSW placed call for phone session per schedule. Phone rang multiple times with outgoing automated message coming on to say no vm box has been set up. No ability to leave message. ?

## 2021-05-03 ENCOUNTER — Other Ambulatory Visit: Payer: Self-pay

## 2021-05-04 ENCOUNTER — Other Ambulatory Visit: Payer: Self-pay

## 2021-05-10 ENCOUNTER — Other Ambulatory Visit: Payer: Self-pay

## 2021-05-11 ENCOUNTER — Other Ambulatory Visit: Payer: Self-pay

## 2021-06-01 ENCOUNTER — Other Ambulatory Visit: Payer: Self-pay

## 2021-06-01 ENCOUNTER — Ambulatory Visit (HOSPITAL_COMMUNITY): Payer: No Payment, Other | Admitting: Licensed Clinical Social Worker

## 2021-06-01 ENCOUNTER — Ambulatory Visit (HOSPITAL_COMMUNITY)
Admission: EM | Admit: 2021-06-01 | Discharge: 2021-06-01 | Disposition: A | Discharge status: Home / Self Care | Payer: No Payment, Other | Attending: Psychiatry | Admitting: Psychiatry

## 2021-06-01 DIAGNOSIS — F4323 Adjustment disorder with mixed anxiety and depressed mood: Secondary | ICD-10-CM | POA: Insufficient documentation

## 2021-06-01 DIAGNOSIS — F319 Bipolar disorder, unspecified: Secondary | ICD-10-CM | POA: Insufficient documentation

## 2021-06-01 NOTE — ED Triage Notes (Signed)
Pt presents to [facility] accompanied by her sister in law. Pts sister in law wants her to get an evaluation. Pt states that she thought she had an appointment upstairs but she did not have an appointment. Pt state that she was going to outpatient for medication management and therapy. Pt reports she is diagnosed bipolar. Pt denies SI/HI and AVH. ?

## 2021-06-01 NOTE — ED Notes (Signed)
Pt discharged with  AVS.  AVS reviewed prior to discharge.  Pt alert, oriented, and ambulatory.  Safety maintained.  °

## 2021-06-01 NOTE — Discharge Instructions (Signed)

## 2021-06-01 NOTE — ED Provider Notes (Signed)
Behavioral Health Urgent Care Medical Screening Exam ? ?Patient Name: Chloe [name] ?MRN: [id number] ?Date of Evaluation: 06/01/21 ?Chief Complaint:   ?Diagnosis:  ?Final diagnoses:  ?Adjustment disorder with mixed anxiety and depressed mood  ? ? ?History of Present illness: Chloe [name] is a 56 y.o. female. Patient presents voluntarily to [facility] behavioral health for walk-in assessment.  Patient is accompanied by her sister-in-law, Ranae Palms, who does not remain present during assessment.  ? ?Patient is assessed face-to-face by nurse practitioner.  She is seated in assessment area, no acute distress.  She is alert and oriented, pleasant and cooperative during assessment.  ? ?Priscilla reports recent stressors include packing her belongings as she is moving from her home into the home of her brother and sister-in-law.  She would prefer not to move to the home of her brother and sister-in-law however she currently has financial difficulties.  She has applied for disability, has been denied.  She is currently in the process of applying again for disability benefit.  ? ? There was a miscommunication today, patient's sister-in-law believed that [facility] had an outpatient therapy appointment today however appointment had been rescheduled for Jun 25, 2021.  Patient and her sister-in-law became frustrated with one another, resulted in a verbal altercation while driving to [facility] behavioral health today. ? ?[name] has been diagnosed with bipolar 1 disorder as well as anxiety.  She is followed by outpatient psychiatry at [facility] behavioral health.  She is typically compliant with medications including lamotrigine and carbamazepine.  She has missed several doses of lamotrigine, reports plan to pick up this medication today.  She is also followed by outpatient counseling at [facility] health.  He she endorses 2 previous inpatient psychiatric hospitalizations.  No family mental  health history reported. ? ?She presents with depressed mood, tearful affect, becomes very tearful when speaking about moving from her home.  She denies suicidal and homicidal ideations.  She contracts verbally for safety with this Clinical research associate. ? ?She has normal speech and behavior.  She denies both auditory and visual hallucinations.  Patient is able to converse coherently with goal-directed thoughts and no distractibility or preoccupation.  She denies paranoia.  Objectively there is no evidence of psychosis/mania or delusional thinking. ? ?Chloe [name] resides in [place], she denies access to weapons.  She is not currently employed.  She endorses rare alcohol use, less than 1 drink per month.  She denies substance use aside from alcohol.  Patient endorses average sleep and appetite. ? ?Patient offered support and encouragement.  She gives verbal consent to speak with her sister-in-law, Ranae Palms, who is also her power of attorney. ?[name] agrees with plan to have patient follow-up with outpatient psychiatry.  She denies safety concerns.  She verbalizes understanding of strict return precautions. ? ?Patient and her sister-in-law are educated and verbalize understanding of mental health resources and other crisis services in the community. They are instructed to call 911 and present to the nearest emergency room should she experience any suicidal/homicidal ideation, auditory/visual/hallucinations, or detrimental worsening of her mental health condition.  ? ? ?Psychiatric Specialty Exam ? ?Presentation  ?General Appearance:Appropriate for Environment; Casual ? ?Eye Contact:Good ? ?Speech:Clear and Coherent; Normal Rate ? ?Speech Volume:Normal ? ?Handedness:Right ? ? ?Mood and Affect  ?Mood:Depressed ? ?Affect:Depressed; Tearful ? ? ?Thought Process  ?Thought Processes:Coherent; Goal Directed; Linear ? ?Descriptions of Associations:Intact ? ?Orientation:Full (Time, Place and Person) ? ?Thought Content:Logical; WDL ?  Diagnosis of Schizophrenia or Schizoaffective disorder in  past: Yes ?  Hallucinations:None ? ?Ideas of Reference:None ? ?Suicidal Thoughts:No ? ?Homicidal Thoughts:No ? ? ?Sensorium  ?Memory:Immediate Good ? ?Judgment:Fair ? ?Insight:Fair ? ? ?Executive Functions  ?Concentration:Good ? ?Attention Span:Good ? ?Recall:Good ? ?Fund of Knowledge:Good ? ?Language:Good ? ? ?Psychomotor Activity  ?Psychomotor Activity:Normal ? ? ?Assets  ?Assets:Communication Skills; Desire for Improvement; Housing; Intimacy; Leisure Time; Physical Health; Resilience; Social Support ? ? ?Sleep  ?Sleep:Good ? ?Number of hours: No data recorded ? ?Nutritional Assessment (For OBS and FBC admissions only) ?Has the patient had a weight loss or gain of 10 pounds or more in the last 3 months?: No ?Has the patient had a decrease in food intake/or appetite?: No ?Does the patient have dental problems?: No ?Does the patient have eating habits or behaviors that may be indicators of an eating disorder including binging or inducing vomiting?: No ?Has the patient recently lost weight without trying?: 0 ?Has the patient been eating poorly because of a decreased appetite?: 0 ?Malnutrition Screening Tool Score: 0 ? ? ? ?Physical Exam: ?Physical Exam ?Vitals and nursing note reviewed.  ?Constitutional:   ?   Appearance: Normal appearance. She is well-developed.  ?HENT:  ?   Head: Normocephalic and atraumatic.  ?   Nose: Nose normal.  ?Cardiovascular:  ?   Rate and Rhythm: Normal rate.  ?Pulmonary:  ?   Effort: Pulmonary effort is normal.  ?Musculoskeletal:     ?   General: Normal range of motion.  ?   Cervical back: Normal range of motion.  ?Skin: ?   General: Skin is warm and dry.  ?Neurological:  ?   Mental Status: She is alert and oriented to person, place, and time.  ?Psychiatric:     ?   Attention and Perception: Attention and perception normal.     ?   Mood and Affect: Mood is depressed. Affect is tearful.     ?   Speech: Speech normal.     ?    Behavior: Behavior normal. Behavior is cooperative.     ?   Thought Content: Thought content normal.     ?   Cognition and Memory: Cognition and memory normal.  ? ?Review of Systems  ?Constitutional: Negative.   ?HENT: Negative.    ?Eyes: Negative.   ?Respiratory: Negative.    ?Cardiovascular: Negative.   ?Gastrointestinal: Negative.   ?Genitourinary: Negative.   ?Musculoskeletal: Negative.   ?Skin: Negative.   ?Neurological: Negative.   ?Endo/Heme/Allergies: Negative.   ?Psychiatric/Behavioral:  Positive for depression.   ?Blood pressure 116/79, pulse 69, temperature 98.2 ?F (36.8 ?C), temperature source Oral, resp. rate 18, SpO2 97 %. There is no height or weight on file to calculate BMI. ? ?Musculoskeletal: ?Strength & Muscle Tone: within normal limits ?Gait & Station: normal ?Patient leans: N/A ? ? ?[facility] MSE Discharge Disposition for Follow up and Recommendations: ?Based on my evaluation the patient does not appear to have an emergency medical condition and can be discharged with resources and follow up care in outpatient services for Medication Management and Individual Therapy ?Patient reviewed with Dr Bronwen Betters. ?Follow up with established outpatient psychiatry. ? ?Lenard Lance, FNP ?06/01/2021, 12:08 PM ? ?

## 2021-06-04 ENCOUNTER — Other Ambulatory Visit: Payer: Self-pay

## 2021-06-04 ENCOUNTER — Other Ambulatory Visit (HOSPITAL_COMMUNITY): Payer: Self-pay

## 2021-06-05 ENCOUNTER — Other Ambulatory Visit: Payer: Self-pay

## 2021-06-07 ENCOUNTER — Ambulatory Visit (INDEPENDENT_AMBULATORY_CARE_PROVIDER_SITE_OTHER): Payer: No Payment, Other | Admitting: Psychiatry

## 2021-06-07 ENCOUNTER — Telehealth (HOSPITAL_COMMUNITY): Payer: No Payment, Other | Admitting: Psychiatry

## 2021-06-07 DIAGNOSIS — F319 Bipolar disorder, unspecified: Secondary | ICD-10-CM | POA: Diagnosis not present

## 2021-06-07 NOTE — Progress Notes (Signed)
BH MD/PA/NP OP Progress Note ? ?06/07/2021 3:46 PM ?Chloe [name]  ?MRN:  [id number] ? ?Virtual Visit via Telephone Note ? ?I connected with Chloe [name] on 06/07/21 at  3:30 PM EDT by telephone and verified that I am speaking with the correct person using two identifiers. ? ?Location: ?Patient: home ?Provider: offsite ?  ?I discussed the limitations, risks, security and privacy concerns of performing an evaluation and management service by telephone and the availability of in person appointments. I also discussed with the patient that there may be a patient responsible charge related to this service. The patient expressed understanding and agreed to proceed. ? ? ? ?  ?I discussed the assessment and treatment plan with the patient. The patient was provided an opportunity to ask questions and all were answered. The patient agreed with the plan and demonstrated an understanding of the instructions. ?  ?The patient was advised to call back or seek an in-person evaluation if the symptoms worsen or if the condition fails to improve as anticipated. ? ?I provided 10 minutes of non-face-to-face time during this encounter. ? ? ?Mcneil Sober, NP  ? ?Chief Complaint: Follow Psychiatric Evaluation ? ?HPI: Chloe [name] is a 56 year old female presenting to [facility] behavioral health outpatient for follow-up psychiatric evaluation.  She has a psychiatric history of bipolar disorder and her symptoms are managed with Tegretol 100 mg twice daily and Lamictal 200 mg twice daily.  Patient reports that medications are effective with managing her symptoms and she is medication compliant.  Patient denies adverse medication effects or the need for dosage adjustment today.  Patient has remaining refills and does not need refills today.  No medication changes. ? ?Visit Diagnosis:  ?  ICD-10-CM   ?1. Bipolar 1 disorder (HCC)  F31.9   ?  ? ? ?Past Psychiatric History:  ? ?Past Medical History:  ?Past Medical History:  ?Diagnosis  Date  ? Anxiety   ? Bipolar disorder (HCC)   ? Breast mass, right   ? GERD (gastroesophageal reflux disease)   ? Hyperlipidemia   ? Post-operative nausea and vomiting   ? Smoker   ?  ?Past Surgical History:  ?Procedure Laterality Date  ? ABDOMINAL HYSTERECTOMY    ? had nausea and vomiting post-op  ? BREAST LUMPECTOMY WITH RADIOACTIVE SEED LOCALIZATION Right 11/12/2016  ? Procedure: RIGHT BREAST LUMPECTOMY WITH RADIOACTIVE SEED LOCALIZATION;  Surgeon: Harriette Bouillon, MD;  Location: [facility];  Service: General;  Laterality: Right;  ? CHOLECYSTECTOMY    ? LAPAROSCOPIC LYSIS OF ADHESIONS    ? MYOMECTOMY    ? PLANTAR FASCIA SURGERY Left   ? ? ?Family Psychiatric History:  ? ?Family History:  ?Family History  ?Problem Relation Age of Onset  ? Lung cancer Mother   ? Hyperlipidemia Brother   ? Colon cancer Neg Hx   ? Esophageal cancer Neg Hx   ? Stomach cancer Neg Hx   ? ? ?Social History:  ?Social History  ? ?Socioeconomic History  ? Marital status: Divorced  ?  Spouse name: Not on file  ? Number of children: Not on file  ? Years of education: Not on file  ? Highest education level: Not on file  ?Occupational History  ? Not on file  ?Tobacco Use  ? Smoking status: Some Days  ?  Packs/day: 0.50  ?  Types: Cigarettes  ? Smokeless tobacco: Never  ?Vaping Use  ? Vaping Use: Never used  ?Substance and Sexual Activity  ?  Alcohol use: Not Currently  ?  Comment: social  ? Drug use: No  ? Sexual activity: Not Currently  ?Other Topics Concern  ? Not on file  ?Social History Narrative  ? Not on file  ? ?Social Determinants of Health  ? ?Financial Resource Strain: Not on file  ?Food Insecurity: Not on file  ?Transportation Needs: Not on file  ?Physical Activity: Not on file  ?Stress: Not on file  ?Social Connections: Not on file  ? ? ?Allergies:  ?Allergies  ?Allergen Reactions  ? Penicillins Hives  ?  Has patient had a PCN reaction causing immediate rash, facial/tongue/throat swelling, SOB or lightheadedness with  hypotension: Yes ?Has patient had a PCN reaction causing severe rash involving mucus membranes or skin necrosis: No ?Has patient had a PCN reaction that required hospitalization: No ?Has patient had a PCN reaction occurring within the last 10 years: Yes ?If all of the above answers are "NO", then may proceed with Cephalosporin use.  ? Demerol [Meperidine]   ? ? ?Metabolic Disorder Labs: ?Lab Results  ?Component Value Date  ? HGBA1C 5.2 11/30/2017  ? MPG 102.54 11/30/2017  ? ?Lab Results  ?Component Value Date  ? PROLACTIN 12.4 11/30/2017  ? ?Lab Results  ?Component Value Date  ? CHOL 220 (H) 07/19/2020  ? TRIG 134 07/19/2020  ? HDL 54 07/19/2020  ? CHOLHDL 4.1 07/19/2020  ? VLDL 18 11/30/2017  ? LDLCALC 142 (H) 07/19/2020  ? LDLCALC 100 (H) 11/30/2017  ? ?Lab Results  ?Component Value Date  ? TSH 1.290 07/19/2020  ? TSH 1.717 11/30/2017  ? ? ?Therapeutic Level Labs: ?No results found for: LITHIUM ?No results found for: VALPROATE ?No components found for:  CBMZ ? ?Current Medications: ?Current Outpatient Medications  ?Medication Sig Dispense Refill  ? albuterol (PROAIR HFA) 108 (90 Base) MCG/ACT inhaler INHALE 2 PUFFS EVERY 4-6 HOURS AS NEEDED 18 g 0  ? aspirin EC 81 MG tablet Take 81 mg by mouth daily. Swallow whole.    ? carbamazepine (TEGRETOL-XR) 100 MG 12 hr tablet Take 1 tablet (100 mg total) by mouth 2 (two) times daily. 60 tablet 3  ? cetirizine (ZYRTEC ALLERGY) 10 MG tablet Take 1 tablet (10 mg total) by mouth daily as needed for allergies. 60 tablet 0  ? estradiol (VIVELLE-DOT) 0.1 MG/24HR patch Place 1 patch (0.1 mg total) onto the skin every 3 (three) days. 10 patch 3  ? lamoTRIgine (LAMICTAL) 200 MG tablet TAKE 1 TABLET (200 MG TOTAL) BY MOUTH 2 (TWO) TIMES DAILY. 60 tablet 3  ? nicotine polacrilex (NICORETTE MINI) 4 MG lozenge Take one three times daily 100 tablet 4  ? Omega-3 1000 MG CAPS Take 1 capsule by mouth daily.    ? omeprazole (PRILOSEC) 20 MG capsule Take 1 capsule (20 mg total) by mouth  daily. 30 capsule 2  ? polyethylene glycol (MIRALAX / GLYCOLAX) 17 g packet Take 17 g by mouth daily. 14 each 0  ? ?No current facility-administered medications for this visit.  ? ? ? ?Musculoskeletal: ?Strength & Muscle Tone:  n/a virtual visit ?Gait & Station:  n/a ?Patient leans: N/A ? ?Psychiatric Specialty Exam: ?Review of Systems  ?Psychiatric/Behavioral:  Negative for hallucinations, self-injury and suicidal ideas. The patient is not hyperactive.   ?All other systems reviewed and are negative.  ?There were no vitals taken for this visit.There is no height or weight on file to calculate BMI.  ?General Appearance: NA  ?Eye Contact:  NA  ?Speech:  Clear and  Coherent  ?Volume:  Normal  ?Mood:  Euthymic  ?Affect:  NA  ?Thought Process:  Goal Directed  ?Orientation:  Full (Time, Place, and Person)  ?Thought Content: Logical   ?Suicidal Thoughts:  No  ?Homicidal Thoughts:  No  ?Memory:  good  ?Judgement:  Good  ?Insight:  Good  ?Psychomotor Activity:  NA  ?Concentration:  good  ?Recall:  Good  ?Fund of Knowledge: Good  ?Language: Good  ?Akathisia:  NA  ?Handed:  Right  ?AIMS (if indicated): not done  ?Assets:  Communication Skills ?Desire for Improvement  ?ADL's:  Intact  ?Cognition: WNL  ?Sleep:  NA  ? ?Screenings: ?AUDIT   ? ?Flowsheet Row [facility]  ?Alcohol Use Disorder Identification Test Final Score (AUDIT) 0  ? ?  ? ?GAD-7   ? ?Flowsheet Row Video Visit from 03/28/2021 in [facility] Clinical Support from 01/01/2021 in [facility] Office Visit from 07/19/2020 in [facility]  ?Total GAD-7 Score 6 9 13   ? ?  ? ?PHQ2-9   ? ?Flowsheet Row Counselor from 03/28/2021 in [facility] Counselor from 01/17/2021 in [facility] Clinical Support from 01/01/2021 in [facility]  Office Visit from 07/19/2020 in [facility]  ?PHQ-2 Total Score 1 2 1 2 2   ?PHQ-9 Total Score -- 7 8 11 8   ?

## 2021-06-13 ENCOUNTER — Other Ambulatory Visit: Payer: Self-pay | Admitting: Critical Care Medicine

## 2021-06-13 ENCOUNTER — Other Ambulatory Visit: Payer: Self-pay

## 2021-06-13 MED ORDER — ALBUTEROL SULFATE HFA 108 (90 BASE) MCG/ACT IN AERS
INHALATION_SPRAY | RESPIRATORY_TRACT | 0 refills | Status: DC
  Filled 2021-06-13: qty 8.5, 25d supply, fill #0

## 2021-06-25 ENCOUNTER — Ambulatory Visit (INDEPENDENT_AMBULATORY_CARE_PROVIDER_SITE_OTHER): Payer: No Payment, Other | Admitting: Licensed Clinical Social Worker

## 2021-06-25 ENCOUNTER — Encounter (HOSPITAL_COMMUNITY): Payer: Self-pay

## 2021-06-25 DIAGNOSIS — F319 Bipolar disorder, unspecified: Secondary | ICD-10-CM | POA: Diagnosis not present

## 2021-06-25 NOTE — Plan of Care (Signed)
?  Problem: Bipolar Disorder CCP Problem  1 Learn and Apply Coping Skills to Improve Mood Stability ?Goal: LTG: Stabilize mood and increase goal-directed behavior as evidenced by self-report. ?Outcome: Not Applicable ?Goal: STG: Chloe [name] WILL IDENTIFY COGNITIVE PATTERNS AND BELIEFS THAT INTERFERE WITH THERAPY ?Outcome: Not Applicable ?Goal: STG: Chloe [name] WILL ATTEND AT LEAST 80% OF SCHEDULED FOLLOW-UP MEDICATION MANAGEMENT APPOINTMENTS ?Outcome: Not Applicable ?  ?
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
## 2021-07-02 ENCOUNTER — Telehealth: Payer: Self-pay | Admitting: Critical Care Medicine

## 2021-07-02 ENCOUNTER — Other Ambulatory Visit: Payer: Self-pay

## 2021-07-02 MED ORDER — PRAVASTATIN SODIUM 40 MG PO TABS
40.0000 mg | ORAL_TABLET | Freq: Every day | ORAL | 1 refills | Status: DC
  Filled 2021-07-02: qty 90, 90d supply, fill #0

## 2021-07-02 NOTE — Telephone Encounter (Signed)
Refill on cholesterol pill sent, keep next appt  ?

## 2021-07-02 NOTE — Addendum Note (Signed)
Addended by: Shan Levans E on: 07/02/2021 02:50 PM ? ? Modules accepted: Orders ? ?

## 2021-07-02 NOTE — Telephone Encounter (Signed)
Medication Refill - Medication: pravastatin (PRAVACHOL) tablet 20 mg  ? ?Pt reports that she is completely out, has scheduled next available with PCP.  ? ?Has the patient contacted their pharmacy? Yes.   ?(Agent: If no, request that the patient contact the pharmacy for the refill. If patient does not wish to contact the pharmacy document the reason why and proceed with request.) ?(Agent: If yes, when and what did the pharmacy advise?) ? ?Preferred Pharmacy (with phone number or street name):  ?[facility]  ?[address], [address] [place] [address]  ?Phone: [phone] Fax: [phone]  ? ?Has the patient been seen for an appointment in the last year OR does the patient have an upcoming appointment? Yes.   ? ?Agent: Please be advised that RX refills may take up to 3 business days. We ask that you follow-up with your pharmacy. ? ?

## 2021-07-02 NOTE — Telephone Encounter (Signed)
Called pt and left vm  

## 2021-07-02 NOTE — Telephone Encounter (Signed)
Patient called and advised Pravachol was removed from her medication list 07/19/20 due to patient reported not taking. She says she did stop taking it and now is wondering if she should start back. I asked had she been taking it when blood work was done 07/19/20, she says yes. I advised based on the lab work and the result notes from provider is that everything is normal, so that means the labs are normal due to the medication is working. She says she will start back taking it and she will need a new Rx due to being out of the medication. Advised I will send the request to Dr. Delford Field for approval. ?

## 2021-07-10 ENCOUNTER — Other Ambulatory Visit: Payer: Self-pay

## 2021-07-19 ENCOUNTER — Other Ambulatory Visit: Payer: Self-pay

## 2021-07-20 ENCOUNTER — Other Ambulatory Visit: Payer: Self-pay

## 2021-07-24 ENCOUNTER — Other Ambulatory Visit: Payer: Self-pay

## 2021-07-24 ENCOUNTER — Encounter (HOSPITAL_COMMUNITY): Payer: Self-pay | Admitting: Psychiatry

## 2021-07-24 ENCOUNTER — Ambulatory Visit (INDEPENDENT_AMBULATORY_CARE_PROVIDER_SITE_OTHER): Payer: No Payment, Other | Admitting: Psychiatry

## 2021-07-24 DIAGNOSIS — F319 Bipolar disorder, unspecified: Secondary | ICD-10-CM | POA: Diagnosis not present

## 2021-07-24 MED ORDER — CARBAMAZEPINE ER 100 MG PO TB12
100.0000 mg | ORAL_TABLET | Freq: Two times a day (BID) | ORAL | 3 refills | Status: DC
  Filled 2021-07-24 – 2021-08-10 (×2): qty 60, 30d supply, fill #0
  Filled 2021-09-12: qty 60, 30d supply, fill #1
  Filled 2021-10-17: qty 60, 30d supply, fill #2

## 2021-07-24 MED ORDER — LAMOTRIGINE 200 MG PO TABS
ORAL_TABLET | Freq: Two times a day (BID) | ORAL | 3 refills | Status: DC
  Filled 2021-07-24: qty 60, fill #0
  Filled 2021-08-10: qty 60, 30d supply, fill #0
  Filled 2021-09-12: qty 60, 30d supply, fill #1
  Filled 2021-10-17: qty 60, 30d supply, fill #2

## 2021-07-24 NOTE — Progress Notes (Signed)
BH MD/PA/NP OP Progress Note Virtual Visit via Telephone Note  I connected with Chloe [name] on 07/24/21 at  2:00 PM EDT by telephone and verified that I am speaking with the correct person using two identifiers.  Location: Patient: home Provider: Clinic   I discussed the limitations, risks, security and privacy concerns of performing an evaluation and management service by telephone and the availability of in person appointments. I also discussed with the patient that there may be a patient responsible charge related to this service. The patient expressed understanding and agreed to proceed.   I provided 30 minutes of non-face-to-face time during this encounter.    07/24/2021 2:56 PM Chloe [name]  MRN:  [id number]  Chief Complaint: "I been doing okay"    HPI: 56 year old female seen today for follow-up psychiatric evaluation.  She has a psychiatric history of bipolar affective disorder and tobacco dependence.  She is currently managed on Lamictal 200 mg twice daily and Tegretol 100 mg 2 times (higher doses makes dizzy) nightly.  She reports her medications are effective in managing her psychiatric conditions.  Today patient was unable to logon virtually so her status was done over the phone.  During exam she was pleasant, cooperative, and engaged in conversation.  She informed Clinical research associate that overall she is doing okay.  She notes that her mood is stable and reports that her anxiety and depression are well managed.  Provider conducted a GAD-7 and patient scored 11.  Provider also conducted PHQ-9 a point scored a 3.  She endorses adequate sleep and appetite.  Today she denies SI/HI/VAH, mania, paranoia.    Patient notes that recently she moved in with her brother.  She notes that she is adjusting to living in this setting.  She informed Clinical research associate that she tries to stay busy and assist his brother and family with household chores.    No medication changes made today.  Patient agreeable to  continue medication as prescribed.  She will follow-up outpatient counseling for therapy.  No other concerns at this time.  Visit Diagnosis:    ICD-10-CM   1. Bipolar 1 disorder (HCC)  F31.9 carbamazepine (TEGRETOL-XR) 100 MG 12 hr tablet    lamoTRIgine (LAMICTAL) 200 MG tablet      Past Psychiatric History: bipolar affective disorder and tobacco dependence, several psychiatric hospitalizations in the past. She was last admitted 25 November 2017 and was noted to have psychotic symptoms along with manic features during that hospital stay. She was discharged on risperidone, carbamazepine and trazodone at that time.  Past Medical History:  Past Medical History:  Diagnosis Date   Anxiety    Bipolar disorder (HCC)    Breast mass, right    GERD (gastroesophageal reflux disease)    Hyperlipidemia    Post-operative nausea and vomiting    Smoker     Past Surgical History:  Procedure Laterality Date   ABDOMINAL HYSTERECTOMY     had nausea and vomiting post-op   BREAST LUMPECTOMY WITH RADIOACTIVE SEED LOCALIZATION Right 11/12/2016   Procedure: RIGHT BREAST LUMPECTOMY WITH RADIOACTIVE SEED LOCALIZATION;  Surgeon: Harriette Bouillon, MD;  Location: [facility];  Service: General;  Laterality: Right;   CHOLECYSTECTOMY     LAPAROSCOPIC LYSIS OF ADHESIONS     MYOMECTOMY     PLANTAR FASCIA SURGERY Left     Family Psychiatric History: Unknown  Family History:  Family History  Problem Relation Age of Onset   Lung cancer Mother    Hyperlipidemia Brother  Colon cancer Neg Hx    Esophageal cancer Neg Hx    Stomach cancer Neg Hx     Social History:  Social History   Socioeconomic History   Marital status: Divorced    Spouse name: Not on file   Number of children: Not on file   Years of education: Not on file   Highest education level: Not on file  Occupational History   Not on file  Tobacco Use   Smoking status: Some Days    Packs/day: 0.50    Types: Cigarettes    Smokeless tobacco: Never  Vaping Use   Vaping Use: Never used  Substance and Sexual Activity   Alcohol use: Not Currently    Comment: social   Drug use: No   Sexual activity: Not Currently  Other Topics Concern   Not on file  Social History Narrative   Not on file   Social Determinants of Health   Financial Resource Strain: Not on file  Food Insecurity: Not on file  Transportation Needs: Not on file  Physical Activity: Not on file  Stress: Not on file  Social Connections: Not on file    Allergies:  Allergies  Allergen Reactions   Penicillins Hives    Has patient had a PCN reaction causing immediate rash, facial/tongue/throat swelling, SOB or lightheadedness with hypotension: Yes Has patient had a PCN reaction causing severe rash involving mucus membranes or skin necrosis: No Has patient had a PCN reaction that required hospitalization: No Has patient had a PCN reaction occurring within the last 10 years: Yes If all of the above answers are "NO", then may proceed with Cephalosporin use.   Demerol [Meperidine]     Metabolic Disorder Labs: Lab Results  Component Value Date   HGBA1C 5.2 11/30/2017   MPG 102.54 11/30/2017   Lab Results  Component Value Date   PROLACTIN 12.4 11/30/2017   Lab Results  Component Value Date   CHOL 220 (H) 07/19/2020   TRIG 134 07/19/2020   HDL 54 07/19/2020   CHOLHDL 4.1 07/19/2020   VLDL 18 11/30/2017   LDLCALC 142 (H) 07/19/2020   LDLCALC 100 (H) 11/30/2017   Lab Results  Component Value Date   TSH 1.290 07/19/2020   TSH 1.717 11/30/2017    Therapeutic Level Labs: No results found for: LITHIUM No results found for: VALPROATE No components found for:  CBMZ  Current Medications: Current Outpatient Medications  Medication Sig Dispense Refill   albuterol (PROAIR HFA) 108 (90 Base) MCG/ACT inhaler INHALE 2 PUFFS EVERY 4-6 HOURS AS NEEDED 8.5 g 0   aspirin EC 81 MG tablet Take 81 mg by mouth daily. Swallow whole.      carbamazepine (TEGRETOL-XR) 100 MG 12 hr tablet Take 1 tablet (100 mg total) by mouth 2 (two) times daily. 60 tablet 3   cetirizine (ZYRTEC ALLERGY) 10 MG tablet Take 1 tablet (10 mg total) by mouth daily as needed for allergies. 60 tablet 0   estradiol (VIVELLE-DOT) 0.1 MG/24HR patch Place 1 patch (0.1 mg total) onto the skin every 3 (three) days. 10 patch 3   lamoTRIgine (LAMICTAL) 200 MG tablet TAKE 1 TABLET (200 MG TOTAL) BY MOUTH 2 (TWO) TIMES DAILY. 60 tablet 3   nicotine polacrilex (NICORETTE MINI) 4 MG lozenge Take one three times daily 100 tablet 4   Omega-3 1000 MG CAPS Take 1 capsule by mouth daily.     omeprazole (PRILOSEC) 20 MG capsule Take 1 capsule (20 mg total) by mouth daily.  30 capsule 2   polyethylene glycol (MIRALAX / GLYCOLAX) 17 g packet Take 17 g by mouth daily. 14 each 0   pravastatin (PRAVACHOL) 40 MG tablet Take 1 tablet (40 mg total) by mouth daily. 90 tablet 1   No current facility-administered medications for this visit.     Musculoskeletal: Strength & Muscle Tone:  Unable to assess due to telephone visit ,  Gait & Station:  Unable to assess due to telephone visit ,  Patient leans: N/A  Psychiatric Specialty Exam: Review of Systems  There were no vitals taken for this visit.There is no height or weight on file to calculate BMI.  General Appearance:  Unable to assess due to telephone visit  Eye Contact:   Unable to assess due to telephone visit  Speech:  Clear and Coherent and Normal Rate  Volume:  Normal  Mood:  Euthymic  Affect:  Appropriate and Congruent  Thought Process:  Coherent, Goal Directed, and Linear  Orientation:  Full (Time, Place, and Person)  Thought Content: WDL and Logical   Suicidal Thoughts:  No  Homicidal Thoughts:  No  Memory:  Immediate;   Good Recent;   Good Remote;   Good  Judgement:  Good  Insight:  Good  Psychomotor Activity:   Unable to assess due to telephone visit  Concentration:  Concentration: Good and Attention Span:  Good  Recall:  Good  Fund of Knowledge: Good  Language: Good  Akathisia:   Unable to assess due to telephone visit  Handed:  Right  AIMS (if indicated): not done  Assets:  Communication Skills Desire for Improvement Housing Leisure Time Physical Health Social Support  ADL's:  Intact  Cognition: WNL  Sleep:  Good   Screenings: AUDIT    Flowsheet Row [facility]  Alcohol Use Disorder Identification Test Final Score (AUDIT) 0      GAD-7    Flowsheet Row Office Visit from 07/24/2021 in [facility] Video Visit from 03/28/2021 in [facility] Clinical Support from 01/01/2021 in [facility] Office Visit from 07/19/2020 in [facility]  Total GAD-7 Score PHQ2-9    Flowsheet Row Office Visit from 07/24/2021 in [facility] Counselor from 03/28/2021 in [facility] Counselor from 01/17/2021 in [facility] Clinical Support from 01/01/2021 in [facility] Office Visit from 07/19/2020 in [facility]  PHQ-2 Total Score PHQ-9 Total Score 3 -- Flowsheet Row Counselor from 01/17/2021 in [facility] ED from 09/06/2020 in [facility] EMERGENCY DEPARTMENT Clinical Support from 04/12/2020 in [facility]  C-SSRS RISK CATEGORY Low Risk No Risk No Risk        Assessment and Plan: Patient notes that she is doing well on her current medication regimen.At this time no medication changes made.  Patient agreeable to continue her medications as prescribed   1. Bipolar 1 disorder (HCC)  Continue- lamoTRIgine (LAMICTAL) 200 MG tablet; TAKE 1 TABLET (200 MG TOTAL) BY MOUTH 2  (TWO) TIMES DAILY.  Dispense: 60 tablet; Refill: 3 Continue- carbamazepine (TEGRETOL-XR) 100 MG 12 hr tablet; Take 1 tablet (100 mg total) by mouth 2 (two) times daily.  Dispense: 60 tablet; Refill: 3   Follow-up in 40-month Follow-up with therapy  Shanna Cisco, NP 07/24/2021, 2:56 PM

## 2021-08-06 ENCOUNTER — Ambulatory Visit (INDEPENDENT_AMBULATORY_CARE_PROVIDER_SITE_OTHER): Payer: No Payment, Other | Admitting: Licensed Clinical Social Worker

## 2021-08-06 DIAGNOSIS — F319 Bipolar disorder, unspecified: Secondary | ICD-10-CM | POA: Diagnosis not present
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
## 2021-08-10 ENCOUNTER — Other Ambulatory Visit: Payer: Self-pay

## 2021-08-30 ENCOUNTER — Other Ambulatory Visit: Payer: Self-pay

## 2021-08-30 ENCOUNTER — Encounter: Payer: Self-pay | Admitting: Critical Care Medicine

## 2021-08-30 ENCOUNTER — Ambulatory Visit: Payer: Self-pay | Attending: Critical Care Medicine | Admitting: Critical Care Medicine

## 2021-08-30 VITALS — BP 130/83 | HR 66 | Temp 97.9°F | Ht 61.0 in | Wt 150.2 lb

## 2021-08-30 DIAGNOSIS — Z139 Encounter for screening, unspecified: Secondary | ICD-10-CM

## 2021-08-30 DIAGNOSIS — K219 Gastro-esophageal reflux disease without esophagitis: Secondary | ICD-10-CM

## 2021-08-30 DIAGNOSIS — N951 Menopausal and female climacteric states: Secondary | ICD-10-CM

## 2021-08-30 DIAGNOSIS — Z1231 Encounter for screening mammogram for malignant neoplasm of breast: Secondary | ICD-10-CM

## 2021-08-30 DIAGNOSIS — E782 Mixed hyperlipidemia: Secondary | ICD-10-CM

## 2021-08-30 DIAGNOSIS — R739 Hyperglycemia, unspecified: Secondary | ICD-10-CM

## 2021-08-30 DIAGNOSIS — F315 Bipolar disorder, current episode depressed, severe, with psychotic features: Secondary | ICD-10-CM

## 2021-08-30 DIAGNOSIS — F17200 Nicotine dependence, unspecified, uncomplicated: Secondary | ICD-10-CM

## 2021-08-30 DIAGNOSIS — J309 Allergic rhinitis, unspecified: Secondary | ICD-10-CM

## 2021-08-30 DIAGNOSIS — Z9889 Other specified postprocedural states: Secondary | ICD-10-CM

## 2021-08-30 MED ORDER — OMEPRAZOLE 20 MG PO CPDR
20.0000 mg | DELAYED_RELEASE_CAPSULE | Freq: Every day | ORAL | 2 refills | Status: DC
  Filled 2021-08-30: qty 30, 30d supply, fill #0

## 2021-08-30 MED ORDER — ESTRADIOL 0.1 MG/24HR TD PTTW
1.0000 | MEDICATED_PATCH | TRANSDERMAL | 3 refills | Status: DC
  Filled 2021-08-30: qty 8, 24d supply, fill #0
  Filled 2021-10-17: qty 8, 24d supply, fill #1
  Filled 2021-11-30: qty 8, 24d supply, fill #2
  Filled 2022-02-12: qty 8, 24d supply, fill #3

## 2021-08-30 MED ORDER — CETIRIZINE HCL 10 MG PO TABS
10.0000 mg | ORAL_TABLET | Freq: Every day | ORAL | 0 refills | Status: DC | PRN
  Filled 2021-08-30: qty 30, 30d supply, fill #0

## 2021-08-30 MED ORDER — ALBUTEROL SULFATE HFA 108 (90 BASE) MCG/ACT IN AERS
INHALATION_SPRAY | RESPIRATORY_TRACT | 0 refills | Status: DC
  Filled 2021-08-30: qty 8.5, 16d supply, fill #0

## 2021-08-30 MED ORDER — PRAVASTATIN SODIUM 40 MG PO TABS
40.0000 mg | ORAL_TABLET | Freq: Every day | ORAL | 1 refills | Status: DC
  Filled 2021-08-30: qty 90, 90d supply, fill #0
  Filled 2021-10-17: qty 30, 30d supply, fill #0
  Filled 2021-11-29: qty 30, 30d supply, fill #1

## 2021-08-30 NOTE — Assessment & Plan Note (Signed)
  .   Current smoking consumption amount: 1/2 PPD  Dicsussion on advise to quit smoking and smoking impacts: CV impacts  Patient's willingness to quit:  Not engaged to quit  Methods to quit smoking discussed:  behav mod  Medication management of smoking session drugs discussed:none  Resources provided:  AVS   Setting quit date not established  Follow-up arranged 4 mo   Time spent counseling the patient:  5 min  

## 2021-08-30 NOTE — Assessment & Plan Note (Addendum)
Using tums as needed, but prefers omeprazole for better control Refill of omeprazole sent today Diet and lifestyle handout reviewed with patient   The following Lifestyle Medicine recommendations according to American College of Lifestyle Medicine [facility]) were discussed and offered to patient who agrees to start the journey:  A. Whole Foods, Plant-based plate comprising of fruits and vegetables, plant-based proteins, whole-grain carbohydrates was discussed in detail with the patient.   A list for source of those nutrients were also provided to the patient.  Patient will use only water or unsweetened tea for hydration. B.  The need to stay away from risky substances including alcohol, smoking; obtaining 7 to 9 hours of restorative sleep, at least 150 minutes of moderate intensity exercise weekly, the importance of healthy social connections,  and stress reduction techniques were discussed. C.  A full color page of  Calorie density of various food groups per pound showing examples of each food groups was provided to the patient.

## 2021-08-30 NOTE — Assessment & Plan Note (Signed)
Followed by [facility] Carbamezapine 100 mg BID added in addition to Lamotrigine 200 mg BID Recommend she continue to f/u with them

## 2021-08-30 NOTE — Progress Notes (Signed)
Pt requesting refills on medications.  

## 2021-08-30 NOTE — Assessment & Plan Note (Signed)
Stay on vivelle and start black cohash

## 2021-08-30 NOTE — Assessment & Plan Note (Signed)
Last lipid panel 11/2017, repeat panel ordered today  Doing well on Pravastatin, recommend she continue Refills sent

## 2021-08-30 NOTE — Assessment & Plan Note (Signed)
Previous right breast mass excision 2018, surgical pathology findings show adenomyoepithelioma. Updated screening mammogram ordered today

## 2021-08-30 NOTE — Assessment & Plan Note (Signed)
Complete blood count and complete metabolic panel ordered today Last Hemoglobin A1C and lipid panel done 2019, repeats ordered today  Last mammogram 2019, repeat imaging ordered today through scholarship fund

## 2021-08-30 NOTE — Assessment & Plan Note (Signed)
Reports allergies trigger shortness of breath requiring albuterol use daily. Prefers zyrtec over flonase for management  Refills of zyrtec and albuterol sent

## 2021-08-30 NOTE — Progress Notes (Addendum)
Established Patient Office Visit  Subjective   Patient ID: Chloe [name], female    DOB: October 13, 1965  Age: 56 y.o. MRN: [id number]  Chief Complaint  Patient presents with   Medication Refill         Chloe [name] is a 56 year old female presenting for follow up today. She has a history of Bipolar 1, hyperlipidemia, asthma, GERD and tobacco use. She is being seen by ... Services for her Bipolar, who have added carbamazepine to her medication regimen in addition to her lamictal. She feels like this is working well for her.  She also reports some increased shortness of breath requiring her to use albuterol every morning, she believes this is allergy triggered and would like a refill on zyrtec.  Patient is also currently using estradiol patch for post menopausal symptoms but does not feel like this has ever done an adequate job of controlling sweating.  She has a history of GERD and is no longer using omeprazole but is taking tums daily.  She is still smoking but has reduced recently as she is now living with brother and does not have income to purchase her own. Reports about 5 cigarettes a day.  Patient has history of right breast lumpectomy in 2018 and is due to for a mammogram screening.  Reports some left sided thigh pain on and off for a long time, no acute changes.      Past Medical History:  Diagnosis Date   Anxiety    Bipolar disorder (HCC)    Breast mass, right    GERD (gastroesophageal reflux disease)    Hyperlipidemia    Post-operative nausea and vomiting    Smoker    Past Surgical History:  Procedure Laterality Date   ABDOMINAL HYSTERECTOMY     had nausea and vomiting post-op   BREAST LUMPECTOMY WITH RADIOACTIVE SEED LOCALIZATION Right 11/12/2016   Procedure: RIGHT BREAST LUMPECTOMY WITH RADIOACTIVE SEED LOCALIZATION;  Surgeon: Harriette Bouillon, MD;  Location: [facility];  Service: General;  Laterality: Right;   CHOLECYSTECTOMY     LAPAROSCOPIC LYSIS OF  ADHESIONS     MYOMECTOMY     PLANTAR FASCIA SURGERY Left    Social History   Tobacco Use   Smoking status: Some Days    Packs/day: 0.50    Types: Cigarettes   Smokeless tobacco: Never  Vaping Use   Vaping Use: Never used  Substance Use Topics   Alcohol use: Not Currently    Comment: social   Drug use: No   Family History  Problem Relation Age of Onset   Lung cancer Mother    Hyperlipidemia Brother    Colon cancer Neg Hx    Esophageal cancer Neg Hx    Stomach cancer Neg Hx    Allergies  Allergen Reactions   Penicillins Hives    Has patient had a PCN reaction causing immediate rash, facial/tongue/throat swelling, SOB or lightheadedness with hypotension: Yes Has patient had a PCN reaction causing severe rash involving mucus membranes or skin necrosis: No Has patient had a PCN reaction that required hospitalization: No Has patient had a PCN reaction occurring within the last 10 years: Yes If all of the above answers are "NO", then may proceed with Cephalosporin use.   Demerol [Meperidine]       Review of Systems  Constitutional: Negative.   Respiratory:  Positive for shortness of breath.   Gastrointestinal:  Positive for heartburn.  Musculoskeletal:  Left upper thigh pain  All other systems reviewed and are negative.     Objective:     BP 130/83   Pulse 66   Temp 97.9 F (36.6 C) (Oral)   Ht 5\' 1"  (1.549 m)   Wt 150 lb 3.2 oz (68.1 kg)   SpO2 95%   BMI 28.38 kg/m    Physical Exam Vitals reviewed.  Constitutional:      Appearance: Normal appearance.  HENT:     Head: Normocephalic and atraumatic.     Nose: Nose normal.     Mouth/Throat:     Mouth: Mucous membranes are moist.     Pharynx: Oropharynx is clear.  Cardiovascular:     Rate and Rhythm: Normal rate and regular rhythm.     Heart sounds: Normal heart sounds.  Pulmonary:     Effort: Pulmonary effort is normal.     Breath sounds: Normal breath sounds.  Abdominal:     General: Bowel  sounds are normal.     Palpations: Abdomen is soft.  Musculoskeletal:        General: No swelling or deformity.  Skin:    General: Skin is warm.  Neurological:     Mental Status: She is alert and oriented to person, place, and time.  Psychiatric:        Mood and Affect: Mood normal.        Behavior: Behavior normal.      No results found for any visits on 08/30/21.    The 10-year ASCVD risk score (Arnett DK, et al., 2019) is: 5.3%    Assessment & Plan:   Problem List Items Addressed This Visit       Respiratory   Allergic rhinitis    Reports allergies trigger shortness of breath requiring albuterol use daily. Prefers zyrtec over flonase for management  Refills of zyrtec and albuterol sent         Digestive   GERD (gastroesophageal reflux disease)    Using tums as needed, but prefers omeprazole for better control Refill of omeprazole sent today Diet and lifestyle handout reviewed with patient   The following Lifestyle Medicine recommendations according to American College of Lifestyle Medicine [facility]) were discussed and offered to patient who agrees to start the journey:  A. Whole Foods, Plant-based plate comprising of fruits and vegetables, plant-based proteins, whole-grain carbohydrates was discussed in detail with the patient.   A list for source of those nutrients were also provided to the patient.  Patient will use only water or unsweetened tea for hydration. B.  The need to stay away from risky substances including alcohol, smoking; obtaining 7 to 9 hours of restorative sleep, at least 150 minutes of moderate intensity exercise weekly, the importance of healthy social connections,  and stress reduction techniques were discussed. C.  A full color page of  Calorie density of various food groups per pound showing examples of each food groups was provided to the patient.       Relevant Medications   omeprazole (PRILOSEC) 20 MG capsule     Other   History of breast  lump/mass excision (Chronic)    Previous right breast mass excision 2018, surgical pathology findings show adenomyoepithelioma. Updated screening mammogram ordered today       Bipolar affective disorder, depressed, severe, with psychotic behavior (HCC) - Primary    Followed by [facility] Carbamezapine 100 mg BID added in addition to Lamotrigine 200 mg BID Recommend she continue to f/u with  them       Mixed hyperlipidemia    Last lipid panel 11/2017, repeat panel ordered today  Doing well on Pravastatin, recommend she continue Refills sent       Relevant Medications   pravastatin (PRAVACHOL) 40 MG tablet   Other Relevant Orders   Lipid panel   Encounter for health-related screening    Complete blood count and complete metabolic panel ordered today Last Hemoglobin A1C and lipid panel done 2019, repeats ordered today  Last mammogram 2019, repeat imaging ordered today through scholarship fund       Relevant Orders   CBC with Differential/Platelet   Comprehensive metabolic panel   Hemoglobin A1c   Tobacco dependency       Current smoking consumption amount: 1/2 PPD  Dicsussion on advise to quit smoking and smoking impacts: CV impacts  Patient's willingness to quit:  Not engaged to quit  Methods to quit smoking discussed:  behav mod  Medication management of smoking session drugs discussed:none  Resources provided:  AVS   Setting quit date not established  Follow-up arranged 4 mo   Time spent counseling the patient:  5 min       Post menopausal syndrome    Stay on vivelle and start black cohash      Other Visit Diagnoses     Encounter for screening mammogram for malignant neoplasm of breast       Relevant Orders   MM DIGITAL SCREENING BILATERAL   Hyperglycemia       Relevant Orders   Comprehensive metabolic panel   Hemoglobin A1c     38 min spent on hx px and patient educations and complex decision is high  Return in about 4  months (around 12/31/2021).    Shan Levans, MD

## 2021-08-30 NOTE — Patient Instructions (Signed)
Consider taking 1-2 black cohosh supplements daily for your sweating symptoms and stay on the Vivelle patch  Complete set of health screening labs obtained at this visit  We went over with you a lifestyle medicine handout please focus on the stress management diet and exercise components  Refills on your medications sent to our pharmacy  Return to Dr. Delford Field in 4 months  Mammogram will be obtained through the scholarship program

## 2021-08-31 ENCOUNTER — Telehealth: Payer: Self-pay

## 2021-08-31 ENCOUNTER — Other Ambulatory Visit: Payer: Self-pay

## 2021-08-31 LAB — CBC WITH DIFFERENTIAL/PLATELET
Basophils Absolute: 0.1 10*3/uL (ref 0.0–0.2)
Basos: 1 %
EOS (ABSOLUTE): 0.4 10*3/uL (ref 0.0–0.4)
Eos: 4 %
Hematocrit: 44.8 % (ref 34.0–46.6)
Hemoglobin: 15.2 g/dL (ref 11.1–15.9)
Immature Grans (Abs): 0 10*3/uL (ref 0.0–0.1)
Immature Granulocytes: 0 %
Lymphocytes Absolute: 3.4 10*3/uL — ABNORMAL HIGH (ref 0.7–3.1)
Lymphs: 34 %
MCH: 30.8 pg (ref 26.6–33.0)
MCHC: 33.9 g/dL (ref 31.5–35.7)
MCV: 91 fL (ref 79–97)
Monocytes Absolute: 0.9 10*3/uL (ref 0.1–0.9)
Monocytes: 9 %
Neutrophils Absolute: 5.2 10*3/uL (ref 1.4–7.0)
Neutrophils: 52 %
Platelets: 410 10*3/uL (ref 150–450)
RBC: 4.94 x10E6/uL (ref 3.77–5.28)
RDW: 12.2 % (ref 11.7–15.4)
WBC: 10 10*3/uL (ref 3.4–10.8)

## 2021-08-31 LAB — LIPID PANEL
Chol/HDL Ratio: 4 ratio (ref 0.0–4.4)
Cholesterol, Total: 223 mg/dL — ABNORMAL HIGH (ref 100–199)
HDL: 56 mg/dL (ref >39)
LDL Chol Calc (NIH): 135 mg/dL — ABNORMAL HIGH (ref 0–99)
Triglycerides: 178 mg/dL — ABNORMAL HIGH (ref 0–149)
VLDL Cholesterol Cal: 32 mg/dL (ref 5–40)

## 2021-08-31 LAB — COMPREHENSIVE METABOLIC PANEL
ALT: 25 IU/L (ref 0–32)
AST: 20 IU/L (ref 0–40)
Albumin/Globulin Ratio: 1.6 (ref 1.2–2.2)
Albumin: 4.6 g/dL (ref 3.8–4.9)
Alkaline Phosphatase: 104 IU/L (ref 44–121)
BUN/Creatinine Ratio: 17 (ref 9–23)
BUN: 10 mg/dL (ref 6–24)
Bilirubin Total: 0.4 mg/dL (ref 0.0–1.2)
CO2: 23 mmol/L (ref 20–29)
Calcium: 9.7 mg/dL (ref 8.7–10.2)
Chloride: 100 mmol/L (ref 96–106)
Creatinine, Ser: 0.58 mg/dL (ref 0.57–1.00)
Globulin, Total: 2.8 g/dL (ref 1.5–4.5)
Glucose: 93 mg/dL (ref 70–99)
Potassium: 4.4 mmol/L (ref 3.5–5.2)
Sodium: 138 mmol/L (ref 134–144)
Total Protein: 7.4 g/dL (ref 6.0–8.5)
eGFR: 107 mL/min/{1.73_m2} (ref >59)

## 2021-08-31 LAB — HEMOGLOBIN A1C
Est. average glucose Bld gHb Est-mCnc: 111 mg/dL
Hgb A1c MFr Bld: 5.5 % (ref 4.8–5.6)

## 2021-08-31 NOTE — Telephone Encounter (Signed)
-----   Message from Storm Frisk, MD sent at 08/31/2021  6:05 AM EDT ----- Let pt know blood count, kidney normal  cholesterol very high I recommend staying on cholesterol pill one daily , no diabetes

## 2021-08-31 NOTE — Telephone Encounter (Signed)
Pt was called and vm was left, Information has been sent to nurse pool.   

## 2021-08-31 NOTE — Progress Notes (Signed)
Let pt know blood count, kidney normal  cholesterol very high I recommend staying on cholesterol pill one daily , no diabetes

## 2021-09-05 ENCOUNTER — Other Ambulatory Visit: Payer: Self-pay

## 2021-09-05 DIAGNOSIS — Z1231 Encounter for screening mammogram for malignant neoplasm of breast: Secondary | ICD-10-CM

## 2021-09-12 ENCOUNTER — Other Ambulatory Visit: Payer: Self-pay

## 2021-10-01 ENCOUNTER — Ambulatory Visit (HOSPITAL_COMMUNITY): Payer: No Payment, Other | Admitting: Licensed Clinical Social Worker

## 2021-10-09 ENCOUNTER — Ambulatory Visit: Payer: Self-pay

## 2021-10-16 ENCOUNTER — Ambulatory Visit: Payer: Self-pay | Admitting: *Deleted

## 2021-10-16 ENCOUNTER — Ambulatory Visit (INDEPENDENT_AMBULATORY_CARE_PROVIDER_SITE_OTHER): Payer: No Payment, Other | Admitting: Licensed Clinical Social Worker

## 2021-10-16 ENCOUNTER — Ambulatory Visit
Admission: RE | Admit: 2021-10-16 | Discharge: 2021-10-16 | Disposition: A | Discharge status: Home / Self Care | Payer: No Typology Code available for payment source | Source: Ambulatory Visit | Attending: Critical Care Medicine | Admitting: Critical Care Medicine

## 2021-10-16 VITALS — BP 126/78 | Wt 143.4 lb

## 2021-10-16 DIAGNOSIS — Z1231 Encounter for screening mammogram for malignant neoplasm of breast: Secondary | ICD-10-CM

## 2021-10-16 DIAGNOSIS — F319 Bipolar disorder, unspecified: Secondary | ICD-10-CM

## 2021-10-16 NOTE — Progress Notes (Signed)
Ms. Chloe [name] is a 56 y.o. female who presents to [facility] clinic today with no complaints and is here for routine screening.    Pap Smear: Pap smear not completed today. Last Pap smear was 08/2007 at [facility] and underwent hysterectomy. Per patient has no history of an abnormal Pap smear. Last Pap smear result is not available in Epic.   Physical exam: Breasts Breasts symmetrical. No skin abnormalities bilateral breasts. No nipple retraction bilateral breasts. No nipple discharge bilateral breasts. No lymphadenopathy. No lumps palpated bilateral breasts. Right axillary scar noted from previous benign nodule with no new findings.  No results found.  Pelvic/Bimanual Pap is not indicated today    Smoking History: Patient has is a current smoker and was referred to quit line.    Patient Navigation: Patient education provided. Access to services provided for patient through [facility] program. No interpreter provided. No transportation provided   Colorectal Cancer Screening: Per patient has had colonoscopy completed on 10 years ago and agrees to the FIT test today.  No complaints today.    Breast and Cervical Cancer Risk Assessment: Patient has family history of breast cancer (maternal aunt), known genetic mutations, or radiation treatment to the chest before age 31. Patient does not have history of cervical dysplasia, immunocompromised, or DES exposure in-utero.  Risk Assessment     Risk Scores       10/16/2021   Last edited by: Narda Rutherford, LPN   5-year risk: 1.6 %   Lifetime risk: 11.8 %              A: B[facility]CP exam without pap smear No complaints today. Annual screening mammogram.  P: Ref[facility] patient to [facility] for a screening mammogram. Appointment scheduled 9:30 am.  Pascal Lux, NP 10/16/2021 8:46 AM

## 2021-10-16 NOTE — Patient Instructions (Addendum)
Discussed advantages of FIT testing. Patient is agreeable. Will continue to follow with annual mammogram.

## 2021-10-16 NOTE — Progress Notes (Signed)
   THERAPIST PROGRESS NOTE  Session Time: 25 minutes  Participation Level: Minimal  Behavioral Response: CasualAlertEuthymic and Irritable  Type of Therapy: Individual Therapy  Treatment Goals addressed: None; Initial session with client  ProgressTowards Goals: Initial  Interventions: Supportive  Summary: Chloe [name] is a 56 y.o. female who presents for individual therapy session. She reports that she is here because "they" told her she had an appointment. Upon coming into therapist's office she states she did not want to talk to anybody and she is not in a good mood right now because she rode past her old house on the way to this office and she seen a lot of things in the yard and she did not keep the home like that. She reports that she has been residing with her brother for several months and her sister-in-law makes all of her appointments. She is not accompanied by anyone to her visit today. She states that she will talk because they want her to talk to someone but it does not do her any good and she does not feel like talking about any issues. She made comment that if someone wanted to help her they would get the police and go around there with her to get all of the people, it is unclear if she is still speaking of her former residence or not. 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  She maintains she has been taking medications as prescribed and reports that she does not believe this is the correct kind of medicine she should be taking. She denies SI/HI, AVH or psychosis.  Suicidal/Homicidal: Nowithout intent/plan  Therapist Response: Therapist encouraged client to process as she feels she needs while in session and spoke about visit frequency and medication management. Therapist provided  address, phone number and hours of operation for [employer] to client so that she can follow up on her application status, she is agreeable to this. Spoke with client about her return visit and she stated that she would like to come again but in 2-3 months.   Plan: Return again in 2 months.  Diagnosis: Bipolar I Disorder  Patient/Guardian was advised Release of Information must be obtained prior to any record release in order to collaborate their care with an outside provider. Patient/Guardian was advised if they have not already done so to contact the registration department to sign all necessary forms in order for [place] to release information regarding their care.   Consent: Patient/Guardian gives verbal consent for treatment and assignment of benefits for services provided during this visit. Patient/Guardian expressed understanding and agreed to proceed.   Cheri Fowler, [facility] 10/16/2021

## 2021-10-17 ENCOUNTER — Other Ambulatory Visit: Payer: Self-pay

## 2021-10-18 ENCOUNTER — Other Ambulatory Visit: Payer: Self-pay

## 2021-10-25 ENCOUNTER — Encounter (HOSPITAL_COMMUNITY): Payer: Self-pay | Admitting: Psychiatry

## 2021-10-25 ENCOUNTER — Other Ambulatory Visit: Payer: Self-pay

## 2021-10-25 ENCOUNTER — Telehealth (INDEPENDENT_AMBULATORY_CARE_PROVIDER_SITE_OTHER): Payer: No Payment, Other | Admitting: Psychiatry

## 2021-10-25 DIAGNOSIS — F319 Bipolar disorder, unspecified: Secondary | ICD-10-CM

## 2021-10-25 MED ORDER — LAMOTRIGINE 200 MG PO TABS
ORAL_TABLET | Freq: Two times a day (BID) | ORAL | 3 refills | Status: DC
  Filled 2021-10-25: qty 60, fill #0
  Filled 2021-11-29: qty 60, 30d supply, fill #0
  Filled 2021-12-24: qty 60, 30d supply, fill #1
  Filled 2022-01-25: qty 60, 30d supply, fill #2
  Filled 2022-02-28: qty 60, 30d supply, fill #3

## 2021-10-25 NOTE — Progress Notes (Signed)
BH MD/PA/NP OP Progress Note Virtual Visit via Telephone Note  I connected with Chloe [name] on 10/25/21 at 10:30 AM EDT by telephone and verified that I am speaking with the correct person using two identifiers.  Location: Patient: home Provider: Clinic   I discussed the limitations, risks, security and privacy concerns of performing an evaluation and management service by telephone and the availability of in person appointments. I also discussed with the patient that there may be a patient responsible charge related to this service. The patient expressed understanding and agreed to proceed.   I provided 30 minutes of non-face-to-face time during this encounter.    10/25/2021 10:05 AM Chloe [name]  MRN:  [id number]  Chief Complaint: "I'm alright"    HPI: 56 year old female seen today for follow-up psychiatric evaluation.  She has a psychiatric history of bipolar affective disorder and tobacco dependence.  She is currently managed on Lamictal 200 mg twice daily and Tegretol 100 mg 2 times (higher doses makes dizzy) nightly.  She reports her medications are effective in managing her psychiatric conditions.  Today patient was unable to logon virtually so her status was done over the phone.  During exam she was pleasant, cooperative, and engaged in conversation.  She informed Clinical research associate that she is doing alright. She notes that she has been working at the [employer]. She reports that her mood is stable and reports minimal anxiety and depression. Today provider conducted a GAD-7 and patient scored a 13.  Provider also conducted PHQ-9 a point scored a 10.  She endorses adequate sleep and appetite.  Today she denies SI/HI/VH, mania, paranoia.  She informed Clinical research associate that she has auditory hallucinations but is skeptical about starting an antipsychotic.  She notes that she was on Haldol and Risperdal in the past which caused stiffness.  Patient continues to live with her brother. She notes that  things are going well at home and at work. Recently she celebrated a birthday. She notes that she spent time with her family and bought her self some shoes.  At time patient notes that she has back pain. She notes taking tylenol to help manage her pain. She plans to follow up with her PCP for further evaluation.   Patient notes that that she would like to reduce and discontinue her tegretol. She informed Clinical research associate that she want to see if she continues to be mentally stable without it. Provider reccommended reducing Tegretol to 100 mg daily for a few days and then discontinue. She will continue Lamictal as prescribed. No other concerns noted at this time.       Visit Diagnosis:    ICD-10-CM   1. Bipolar 1 disorder (HCC)  F31.9 lamoTRIgine (LAMICTAL) 200 MG tablet      Past Psychiatric History: bipolar affective disorder and tobacco dependence, several psychiatric hospitalizations in the past. She was last admitted 25 November 2017 and was noted to have psychotic symptoms along with manic features during that hospital stay. She was discharged on risperidone, carbamazepine and trazodone at that time.  Past Medical History:  Past Medical History:  Diagnosis Date   Anxiety    Bipolar disorder (HCC)    Breast mass, right    GERD (gastroesophageal reflux disease)    Hyperlipidemia    Post-operative nausea and vomiting    Smoker     Past Surgical History:  Procedure Laterality Date   ABDOMINAL HYSTERECTOMY     had nausea and vomiting post-op   BREAST LUMPECTOMY WITH RADIOACTIVE  SEED LOCALIZATION Right 11/12/2016   Procedure: RIGHT BREAST LUMPECTOMY WITH RADIOACTIVE SEED LOCALIZATION;  Surgeon: Harriette Bouillon, MD;  Location: [facility];  Service: General;  Laterality: Right;   CHOLECYSTECTOMY     LAPAROSCOPIC LYSIS OF ADHESIONS     MYOMECTOMY     PLANTAR FASCIA SURGERY Left     Family Psychiatric History: Unknown  Family History:  Family History  Problem Relation Age of  Onset   Lung cancer Mother    Breast cancer Maternal Aunt    Hyperlipidemia Brother    Colon cancer Neg Hx    Esophageal cancer Neg Hx    Stomach cancer Neg Hx     Social History:  Social History   Socioeconomic History   Marital status: Divorced    Spouse name: Not on file   Number of children: Not on file   Years of education: Not on file   Highest education level: Associate degree: occupational, Scientist, product/process development, or vocational program  Occupational History   Not on file  Tobacco Use   Smoking status: Some Days    Packs/day: 0.50    Types: Cigarettes   Smokeless tobacco: Never  Vaping Use   Vaping Use: Never used  Substance and Sexual Activity   Alcohol use: Not Currently    Comment: social   Drug use: No   Sexual activity: Not Currently  Other Topics Concern   Not on file  Social History Narrative   Not on file   Social Determinants of Health   Financial Resource Strain: Not on file  Food Insecurity: No Food Insecurity (10/16/2021)   Hunger Vital Sign    Worried About Running Out of Food in the Last Year: Never true    Ran Out of Food in the Last Year: Never true  Transportation Needs: No Transportation Needs (10/16/2021)   PRAPARE - Administrator, Civil Service (Medical): No    Lack of Transportation (Non-Medical): No  Physical Activity: Not on file  Stress: Not on file  Social Connections: Not on file    Allergies:  Allergies  Allergen Reactions   Penicillins Hives    Has patient had a PCN reaction causing immediate rash, facial/tongue/throat swelling, SOB or lightheadedness with hypotension: Yes Has patient had a PCN reaction causing severe rash involving mucus membranes or skin necrosis: No Has patient had a PCN reaction that required hospitalization: No Has patient had a PCN reaction occurring within the last 10 years: Yes If all of the above answers are "NO", then may proceed with Cephalosporin use.   Demerol [Meperidine]     Metabolic  Disorder Labs: Lab Results  Component Value Date   HGBA1C 5.5 08/30/2021   MPG 102.54 11/30/2017   Lab Results  Component Value Date   PROLACTIN 12.4 11/30/2017   Lab Results  Component Value Date   CHOL 223 (H) 08/30/2021   TRIG 178 (H) 08/30/2021   HDL 56 08/30/2021   CHOLHDL 4.0 08/30/2021   VLDL 18 11/30/2017   LDLCALC 135 (H) 08/30/2021   LDLCALC 142 (H) 07/19/2020   Lab Results  Component Value Date   TSH 1.290 07/19/2020   TSH 1.717 11/30/2017    Therapeutic Level Labs: No results found for: "LITHIUM" No results found for: "VALPROATE" Lab Results  Component Value Date   CBMZ 8.3 11/30/2017    Current Medications: Current Outpatient Medications  Medication Sig Dispense Refill   albuterol (PROAIR HFA) 108 (90 Base) MCG/ACT inhaler INHALE 2 PUFFS EVERY 4-6  HOURS AS NEEDED 8.5 g 0   aspirin EC 81 MG tablet Take 81 mg by mouth daily. Swallow whole.     cetirizine (ZYRTEC ALLERGY) 10 MG tablet Take 1 tablet (10 mg total) by mouth daily as needed for allergies. 60 tablet 0   estradiol (VIVELLE-DOT) 0.1 MG/24HR patch Place 1 patch (0.1 mg total) onto the skin every 3 (three) days. 10 patch 3   lamoTRIgine (LAMICTAL) 200 MG tablet TAKE 1 TABLET (200 MG TOTAL) BY MOUTH 2 (TWO) TIMES DAILY. 60 tablet 3   omeprazole (PRILOSEC) 20 MG capsule Take 1 capsule (20 mg total) by mouth daily. 30 capsule 2   pravastatin (PRAVACHOL) 40 MG tablet Take 1 tablet (40 mg total) by mouth daily. 90 tablet 1   No current facility-administered medications for this visit.     Musculoskeletal: Strength & Muscle Tone:  Unable to assess due to telephone visit ,  Gait & Station:  Unable to assess due to telephone visit ,  Patient leans: N/A  Psychiatric Specialty Exam: Review of Systems  There were no vitals taken for this visit.There is no height or weight on file to calculate BMI.  General Appearance:  Unable to assess due to telephone visit  Eye Contact:   Unable to assess due to  telephone visit  Speech:  Clear and Coherent and Normal Rate  Volume:  Normal  Mood:  Euthymic  Affect:  Appropriate and Congruent  Thought Process:  Coherent, Goal Directed, and Linear  Orientation:  Full (Time, Place, and Person)  Thought Content: Logical and Hallucinations: Auditory   Suicidal Thoughts:  No  Homicidal Thoughts:  No  Memory:  Immediate;   Good Recent;   Good Remote;   Good  Judgement:  Good  Insight:  Good  Psychomotor Activity:   Unable to assess due to telephone visit  Concentration:  Concentration: Good and Attention Span: Good  Recall:  Good  Fund of Knowledge: Good  Language: Good  Akathisia:   Unable to assess due to telephone visit  Handed:  Right  AIMS (if indicated): not done  Assets:  Communication Skills Desire for Improvement Housing Leisure Time Physical Health Social Support  ADL's:  Intact  Cognition: WNL  Sleep:  Good   Screenings: AUDIT    Flowsheet Row [facility]  Alcohol Use Disorder Identification Test Final Score (AUDIT) 0      GAD-7    Flowsheet Row Video Visit from 10/25/2021 in [facility] Office Visit from 08/30/2021 in [facility] Office Visit from 07/24/2021 in [place] Video Visit from 03/28/2021 in [facility] Clinical Support from 01/01/2021 in [facility]  Total GAD-7 Score 13 12 11 6 9       PHQ2-9    Flowsheet Row Video Visit from 10/25/2021 in [facility] Office Visit from 08/30/2021 in [facility] Office Visit from 07/24/2021 in [facility] Counselor from 03/28/2021 in [facility] Counselor from 01/17/2021 in [facility]  PHQ-2 Total Score 4 2 2 1 2   PHQ-9 Total  Score 10 10 3  -- 7      Flowsheet Row Counselor from 01/17/2021 in [facility] ED from 09/06/2020 in [facility] EMERGENCY DEPARTMENT Clinical Support from 04/12/2020 in [facility]  C-SSRS RISK CATEGORY Low Risk No Risk No Risk        Assessment and Plan: Patient endorses occasional hallucinations but notes that she is able to cope with them.  At this time she does not want to start an antipsychotic as she notes that they cause stiffness in the past.  Patient also informed writer that she would like to discontinue Tegretol to if she remains mentally stable.  Provider recommended i reducing Tegretol 100 mg twice daily to 100 mg for a week and then discontinue.  She endorsed understanding and agreed.  She will continue Lamictal as prescribed.    1. Bipolar 1 disorder (HCC)  Continue- lamoTRIgine (LAMICTAL) 200 MG tablet; TAKE 1 TABLET (200 MG TOTAL) BY MOUTH 2 (TWO) TIMES DAILY.  Dispense: 60 tablet; Refill: 3    Follow-up in 15-month Follow-up with therapy  Shanna Cisco, NP 10/25/2021, 10:05 AM

## 2021-11-29 ENCOUNTER — Other Ambulatory Visit: Payer: Self-pay

## 2021-11-30 ENCOUNTER — Other Ambulatory Visit: Payer: Self-pay | Admitting: Critical Care Medicine

## 2021-11-30 ENCOUNTER — Other Ambulatory Visit: Payer: Self-pay

## 2021-11-30 ENCOUNTER — Other Ambulatory Visit: Payer: Self-pay | Admitting: Pharmacist

## 2021-11-30 MED ORDER — ALBUTEROL SULFATE HFA 108 (90 BASE) MCG/ACT IN AERS
INHALATION_SPRAY | RESPIRATORY_TRACT | 0 refills | Status: DC
  Filled 2021-11-30: qty 8.5, fill #0

## 2021-11-30 MED ORDER — ALBUTEROL SULFATE HFA 108 (90 BASE) MCG/ACT IN AERS
2.0000 | INHALATION_SPRAY | RESPIRATORY_TRACT | 1 refills | Status: DC | PRN
  Filled 2021-11-30: qty 6.7, 16d supply, fill #0
  Filled 2021-12-24: qty 6.7, 16d supply, fill #1

## 2021-12-03 ENCOUNTER — Other Ambulatory Visit: Payer: Self-pay

## 2021-12-04 ENCOUNTER — Other Ambulatory Visit: Payer: Self-pay

## 2021-12-16 ENCOUNTER — Ambulatory Visit (HOSPITAL_COMMUNITY)
Admission: AD | Admit: 2021-12-16 | Discharge: 2021-12-16 | Disposition: A | Discharge status: Home / Self Care | Payer: No Payment, Other | Attending: Psychiatry | Admitting: Psychiatry

## 2021-12-16 ENCOUNTER — Ambulatory Visit (HOSPITAL_COMMUNITY)
Admission: EM | Admit: 2021-12-16 | Discharge: 2021-12-16 | Disposition: A | Discharge status: Home / Self Care | Payer: No Payment, Other | Attending: Psychiatry | Admitting: Psychiatry

## 2021-12-16 DIAGNOSIS — Z91148 Patient's other noncompliance with medication regimen for other reason: Secondary | ICD-10-CM | POA: Insufficient documentation

## 2021-12-16 DIAGNOSIS — Z638 Other specified problems related to primary support group: Secondary | ICD-10-CM | POA: Insufficient documentation

## 2021-12-16 NOTE — Discharge Instructions (Addendum)
Take all medications as prescribed. Keep all follow-up appointments as scheduled.  Do not consume alcohol or use illegal drugs while on prescription medications. Report any adverse effects from your medications to your primary care provider promptly.  In the event of recurrent symptoms or worsening symptoms, call 911, a crisis hotline, or go to the nearest emergency department for evaluation.   

## 2021-12-16 NOTE — ED Notes (Addendum)
Patient was discharged to home by the provider and received her AVS with community resources.
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
## 2021-12-20 ENCOUNTER — Ambulatory Visit (HOSPITAL_COMMUNITY): Payer: 59 | Admitting: Licensed Clinical Social Worker

## 2021-12-24 ENCOUNTER — Other Ambulatory Visit (HOSPITAL_COMMUNITY): Payer: Self-pay

## 2021-12-24 ENCOUNTER — Other Ambulatory Visit: Payer: Self-pay

## 2021-12-25 ENCOUNTER — Other Ambulatory Visit (HOSPITAL_COMMUNITY): Payer: Self-pay

## 2022-01-01 ENCOUNTER — Ambulatory Visit: Payer: Self-pay | Admitting: Critical Care Medicine

## 2022-01-02 ENCOUNTER — Ambulatory Visit: Payer: Self-pay | Attending: Critical Care Medicine | Admitting: Physician Assistant

## 2022-01-02 ENCOUNTER — Other Ambulatory Visit: Payer: Self-pay

## 2022-01-02 ENCOUNTER — Encounter: Payer: Self-pay | Admitting: Physician Assistant

## 2022-01-02 VITALS — BP 124/85 | HR 79 | Wt 137.8 lb

## 2022-01-02 DIAGNOSIS — E782 Mixed hyperlipidemia: Secondary | ICD-10-CM

## 2022-01-02 DIAGNOSIS — Z09 Encounter for follow-up examination after completed treatment for conditions other than malignant neoplasm: Secondary | ICD-10-CM

## 2022-01-02 DIAGNOSIS — J309 Allergic rhinitis, unspecified: Secondary | ICD-10-CM

## 2022-01-02 DIAGNOSIS — J9801 Acute bronchospasm: Secondary | ICD-10-CM

## 2022-01-02 DIAGNOSIS — K219 Gastro-esophageal reflux disease without esophagitis: Secondary | ICD-10-CM

## 2022-01-02 DIAGNOSIS — N3941 Urge incontinence: Secondary | ICD-10-CM

## 2022-01-02 DIAGNOSIS — J40 Bronchitis, not specified as acute or chronic: Secondary | ICD-10-CM

## 2022-01-02 MED ORDER — AZITHROMYCIN 250 MG PO TABS
ORAL_TABLET | ORAL | 0 refills | Status: AC
  Filled 2022-01-02: qty 6, 5d supply, fill #0

## 2022-01-02 MED ORDER — OMEPRAZOLE 20 MG PO CPDR
20.0000 mg | DELAYED_RELEASE_CAPSULE | Freq: Every day | ORAL | 2 refills | Status: DC
  Filled 2022-01-02: qty 30, 30d supply, fill #0
  Filled 2022-02-28: qty 30, 30d supply, fill #1

## 2022-01-02 MED ORDER — CETIRIZINE HCL 10 MG PO TABS
10.0000 mg | ORAL_TABLET | Freq: Every day | ORAL | 1 refills | Status: DC | PRN
  Filled 2022-01-02: qty 30, 30d supply, fill #0

## 2022-01-02 MED ORDER — PRAVASTATIN SODIUM 40 MG PO TABS
40.0000 mg | ORAL_TABLET | Freq: Every day | ORAL | 1 refills | Status: DC
  Filled 2022-01-02: qty 30, 30d supply, fill #0
  Filled 2022-02-12: qty 30, 30d supply, fill #1

## 2022-01-02 MED ORDER — ALBUTEROL SULFATE HFA 108 (90 BASE) MCG/ACT IN AERS
2.0000 | INHALATION_SPRAY | RESPIRATORY_TRACT | 1 refills | Status: DC | PRN
  Filled 2022-01-02: qty 6.7, 16d supply, fill #0

## 2022-01-02 MED ORDER — FLUTICASONE-SALMETEROL 100-50 MCG/ACT IN AEPB
1.0000 | INHALATION_SPRAY | Freq: Two times a day (BID) | RESPIRATORY_TRACT | 3 refills | Status: DC
  Filled 2022-01-02: qty 60, 30d supply, fill #0
  Filled 2022-02-28: qty 60, 30d supply, fill #1

## 2022-01-02 MED ORDER — BENZONATATE 100 MG PO CAPS
200.0000 mg | ORAL_CAPSULE | Freq: Three times a day (TID) | ORAL | 0 refills | Status: DC | PRN
  Filled 2022-01-02: qty 40, 7d supply, fill #0

## 2022-01-02 NOTE — Patient Instructions (Signed)
[facility] [address], [place], [place] [address] [phone] or [phone] Walk-in urgent care 24/7 for anyone  For [place] Residents ONLY New patient assessments and therapy walk-ins: Monday and Wednesday 8am-11am First and second Friday 1pm-5pm New patient psychiatry and medication management walk-ins: Mondays, Wednesdays, Thursdays, Fridays 8am-11am No psychiatry walk-ins on Tuesday    
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
## 2022-01-23 ENCOUNTER — Telehealth (HOSPITAL_COMMUNITY): Payer: 59 | Admitting: Psychiatry

## 2022-01-23 ENCOUNTER — Encounter (HOSPITAL_COMMUNITY): Payer: Self-pay

## 2022-01-25 ENCOUNTER — Other Ambulatory Visit: Payer: Self-pay

## 2022-02-12 ENCOUNTER — Other Ambulatory Visit: Payer: Self-pay

## 2022-02-28 ENCOUNTER — Other Ambulatory Visit: Payer: Self-pay

## 2022-03-06 ENCOUNTER — Other Ambulatory Visit: Payer: Self-pay

## 2022-03-06 ENCOUNTER — Encounter (HOSPITAL_COMMUNITY): Payer: Self-pay | Admitting: Psychiatry

## 2022-03-06 ENCOUNTER — Telehealth (INDEPENDENT_AMBULATORY_CARE_PROVIDER_SITE_OTHER): Payer: No Payment, Other | Admitting: Psychiatry

## 2022-03-06 DIAGNOSIS — F319 Bipolar disorder, unspecified: Secondary | ICD-10-CM | POA: Diagnosis not present

## 2022-03-06 DIAGNOSIS — F411 Generalized anxiety disorder: Secondary | ICD-10-CM | POA: Diagnosis not present

## 2022-03-06 MED ORDER — LAMOTRIGINE 200 MG PO TABS
200.0000 mg | ORAL_TABLET | Freq: Two times a day (BID) | ORAL | 3 refills | Status: DC
  Filled 2022-03-06: qty 60, fill #0
  Filled 2022-04-04: qty 60, 30d supply, fill #0
  Filled 2022-05-03: qty 60, 30d supply, fill #1
  Filled 2022-05-29: qty 60, 30d supply, fill #2
  Filled 2022-07-10: qty 60, 30d supply, fill #3

## 2022-03-06 MED ORDER — ESCITALOPRAM OXALATE 10 MG PO TABS
10.0000 mg | ORAL_TABLET | Freq: Every day | ORAL | 3 refills | Status: DC
  Filled 2022-03-06: qty 30, 30d supply, fill #0
  Filled 2022-04-25: qty 30, 30d supply, fill #1
  Filled 2022-05-29: qty 30, 30d supply, fill #2
  Filled 2022-07-10: qty 30, 30d supply, fill #3

## 2022-03-06 NOTE — Progress Notes (Signed)
BH MD/PA/NP OP Progress Note Virtual Visit via Video Note  I connected with Chloe [name] on 03/06/22 at 10:30 AM EST by a video enabled telemedicine application and verified that I am speaking with the correct person using two identifiers.  Location: Patient: Home Provider: Clinic   I discussed the limitations of evaluation and management by telemedicine and the availability of in person appointments. The patient expressed understanding and agreed to proceed.  I provided 30 minutes of non-face-to-face time during this encounter.      03/06/2022 1:32 PM Chloe [name]  MRN:  [id number]  Chief Complaint: "I'm getting better"    HPI: 57 year old female seen today for follow-up psychiatric evaluation.  She has a psychiatric history of bipolar affective disorder and tobacco dependence.  She is currently managed on Lamictal 200 mg twice daily.  She reports her medication is somewhat effective in managing her psychiatric conditions.  Today she was well groomed, pleasant, cooperative, and engaged in conversation. She informed Probation officer that she is getting better. She notes that she was under the weather (had bronchitis). She notes that she was started on an antibiotics and feels somewhat better. Patient also reports that she continues to suffer from urinary incontinence. She notes that she would like to be seen by an OBGYN. Provider gave patient resources to [facility] for woman. She also notes that she has been having mouth and back pain. Patient given the number to a dentist. Provider recommended she discuss her back pain with her PCP. She endorsed understanding and agreed.  Today she notes that work exacerbates her anxiety and depression.  Patient reports that she continues to work at the [employer].  She informed Probation officer that she does not know what her boss expects of her.  She also notes that she does not know what day she has off as her boss would not give her a clear answer.   Today provider conducted GAD-7 and patient scored an 18, at her last visit she scored a 13.  Provider also conducted a PHQ-9 and patient scored a 21, at her last visit she scored a 10.  She endorses decreased appetite and reports that she is lost approximately 10 pounds.  Patient reports that her sleep fluctuates.  She endorses passive SI but denies wanting to harm himself today.  Today she denies SI/HI/VAH, mania, paranoia.    Patient continues to live with her brother and sister-in-law. She notes that his her sister-in-law is attempting to get her disability and insurance.    Patient informed writer that she would like to restart Tegretol.  Provider discussed the risk of being on Tegretol with Lamictal and recommended she try an antidepressant to help manage anxiety and depression.  She was agreeable to starting Lexapro 10 mg daily to help manage her anxiety and depression.  Provider also recommended patient had labs drawn and ordered a BMP, CBC, thyroid panel, liver function test, and hemoglobin A1c.  She will continue Lamictal as prescribed. No other concerns noted at this time.       Visit Diagnosis:    ICD-10-CM   1. Generalized anxiety disorder  F41.1 escitalopram (LEXAPRO) 10 MG tablet    2. Bipolar 1 disorder (HCC)  F31.9 lamoTRIgine (LAMICTAL) 200 MG tablet    CBC w/Diff/Platelet    Hepatic function panel    Thyroid Panel With TSH    HgB U1L    Basic Metabolic Panel (BMET)      Past Psychiatric History: bipolar affective  disorder and tobacco dependence, several psychiatric hospitalizations in the past. She was last admitted 25 November 2017 and was noted to have psychotic symptoms along with manic features during that hospital stay. She was discharged on risperidone, carbamazepine and trazodone at that time.  Past Medical History:  Past Medical History:  Diagnosis Date   Anxiety    Bipolar disorder ([place])    Breast mass, right    GERD (gastroesophageal reflux disease)     Hyperlipidemia    Post-operative nausea and vomiting    Smoker     Past Surgical History:  Procedure Laterality Date   ABDOMINAL HYSTERECTOMY     had nausea and vomiting post-op   BREAST LUMPECTOMY WITH RADIOACTIVE SEED LOCALIZATION Right 11/12/2016   Procedure: RIGHT BREAST LUMPECTOMY WITH RADIOACTIVE SEED LOCALIZATION;  Surgeon: Erroll Luna, MD;  Location: [facility];  Service: General;  Laterality: Right;   CHOLECYSTECTOMY     LAPAROSCOPIC LYSIS OF ADHESIONS     MYOMECTOMY     PLANTAR FASCIA SURGERY Left     Family Psychiatric History: Unknown  Family History:  Family History  Problem Relation Age of Onset   Lung cancer Mother    Breast cancer Maternal Aunt    Hyperlipidemia Brother    Colon cancer Neg Hx    Esophageal cancer Neg Hx    Stomach cancer Neg Hx     Social History:  Social History   Socioeconomic History   Marital status: Divorced    Spouse name: Not on file   Number of children: Not on file   Years of education: Not on file   Highest education level: Associate degree: occupational, Hotel manager, or vocational program  Occupational History   Not on file  Tobacco Use   Smoking status: Some Days    Packs/day: 0.50    Types: Cigarettes   Smokeless tobacco: Never  Vaping Use   Vaping Use: Never used  Substance and Sexual Activity   Alcohol use: Not Currently    Comment: social   Drug use: No   Sexual activity: Not Currently  Other Topics Concern   Not on file  Social History Narrative   Not on file   Social Determinants of Health   Financial Resource Strain: Not on file  Food Insecurity: No Food Insecurity (10/16/2021)   Hunger Vital Sign    Worried About Running Out of Food in the Last Year: Never true    Ran Out of Food in the Last Year: Never true  Transportation Needs: No Transportation Needs (10/16/2021)   PRAPARE - Hydrologist (Medical): No    Lack of Transportation (Non-Medical): No   Physical Activity: Not on file  Stress: Not on file  Social Connections: Not on file    Allergies:  Allergies  Allergen Reactions   Penicillins Hives    Has patient had a PCN reaction causing immediate rash, facial/tongue/throat swelling, SOB or lightheadedness with hypotension: Yes Has patient had a PCN reaction causing severe rash involving mucus membranes or skin necrosis: No Has patient had a PCN reaction that required hospitalization: No Has patient had a PCN reaction occurring within the last 10 years: Yes If all of the above answers are "NO", then may proceed with Cephalosporin use.   Demerol [Meperidine]     Metabolic Disorder Labs: Lab Results  Component Value Date   HGBA1C 5.5 08/30/2021   MPG 102.54 11/30/2017   Lab Results  Component Value Date   PROLACTIN 12.4  11/30/2017   Lab Results  Component Value Date   CHOL 223 (H) 08/30/2021   TRIG 178 (H) 08/30/2021   HDL 56 08/30/2021   CHOLHDL 4.0 08/30/2021   VLDL 18 11/30/2017   LDLCALC 135 (H) 08/30/2021   LDLCALC 142 (H) 07/19/2020   Lab Results  Component Value Date   TSH 1.290 07/19/2020   TSH 1.717 11/30/2017    Therapeutic Level Labs: No results found for: "LITHIUM" No results found for: "VALPROATE" Lab Results  Component Value Date   CBMZ 8.3 11/30/2017    Current Medications: Current Outpatient Medications  Medication Sig Dispense Refill   escitalopram (LEXAPRO) 10 MG tablet Take 1 tablet (10 mg total) by mouth daily. 30 tablet 3   albuterol (PROVENTIL HFA) 108 (90 Base) MCG/ACT inhaler Inhale 2 puffs into the lungs every 4 (four) hours as needed for wheezing or shortness of breath. 6.7 g 1   aspirin EC 81 MG tablet Take 81 mg by mouth daily. Swallow whole.     benzonatate (TESSALON) 100 MG capsule Take 2 capsules (200 mg total) by mouth 3 (three) times daily as needed. 40 capsule 0   cetirizine (ZYRTEC ALLERGY) 10 MG tablet Take 1 tablet (10 mg total) by mouth daily as needed for allergies.  100 tablet 1   estradiol (VIVELLE-DOT) 0.1 MG/24HR patch Place 1 patch (0.1 mg total) onto the skin every 3 (three) days. 10 patch 3   fluticasone-salmeterol (ADVAIR) 100-50 MCG/ACT AEPB Inhale 1 puff into the lungs 2 (two) times daily. 60 each 3   lamoTRIgine (LAMICTAL) 200 MG tablet TAKE 1 TABLET (200 MG TOTAL) BY MOUTH 2 (TWO) TIMES DAILY. 60 tablet 3   omeprazole (PRILOSEC) 20 MG capsule Take 1 capsule (20 mg total) by mouth daily. 30 capsule 2   pravastatin (PRAVACHOL) 40 MG tablet Take 1 tablet (40 mg total) by mouth daily. 90 tablet 1   No current facility-administered medications for this visit.     Musculoskeletal: Strength & Muscle Tone: within normal limits and telehealth visit ,  Gait & Station: normal, telehealth visit ,  Patient leans: N/A  Psychiatric Specialty Exam: Review of Systems  There were no vitals taken for this visit.There is no height or weight on file to calculate BMI.  General Appearance: Well Groomed  Eye Contact:  Good  Speech:  Clear and Coherent and Normal Rate  Volume:  Normal  Mood:  Euthymic  Affect:  Appropriate and Congruent  Thought Process:  Coherent, Goal Directed, and Linear  Orientation:  Full (Time, Place, and Person)  Thought Content: WDL and Logical   Suicidal Thoughts:  No  Homicidal Thoughts:  No  Memory:  Immediate;   Good Recent;   Good Remote;   Good  Judgement:  Good  Insight:  Good  Psychomotor Activity:  Normal  Concentration:  Concentration: Good and Attention Span: Good  Recall:  Good  Fund of Knowledge: Good  Language: Good  Akathisia:  No  Handed:  Right  AIMS (if indicated): not done  Assets:  Communication Skills Desire for Improvement Housing Leisure Time Physical Health Social Support  ADL's:  Intact  Cognition: WNL  Sleep:  Good   Screenings: AUDIT    Flowsheet Row [facility]  Alcohol Use Disorder Identification Test Final  Score (AUDIT) 0      GAD-7    Flowsheet Row Video Visit from 03/06/2022 in [facility] Video Visit from 10/25/2021  in [facility] Office Visit from 08/30/2021 in [facility] Office Visit from 07/24/2021 in [facility] Video Visit from 03/28/2021 in [facility]  Total GAD-7 Score 18 13 12 11 6       PHQ2-9    Flowsheet Row Video Visit from 03/06/2022 in [facility] Video Visit from 10/25/2021 in [facility] Office Visit from 08/30/2021 in [facility] Office Visit from 07/24/2021 in [facility] Counselor from 03/28/2021 in [facility]  PHQ-2 Total Score 5 4 2 2 1   PHQ-9 Total Score 21 10 10 3  --      Flowsheet Row Video Visit from 03/06/2022 in [facility] OP Visit from [facility] Counselor from 01/17/2021 in [facility]  C-SSRS RISK CATEGORY Error: Q7 should not be populated when Q6 is No Low Risk Low Risk        Assessment and Plan: Patient endorses symptoms of anxiety and depression. Patient informed writer that she would like to restart Tegretol.  Provider discussed the risk of being on Tegretol with Lamictal and recommended she try an antidepressant to help manage anxiety and depression.  She was agreeable to starting Lexapro 10 mg daily to help manage her anxiety and depression.  Provider also recommended patient had labs drawn and ordered a BMP, CBC, thyroid panel, liver function test, and hemoglobin A1c.  She will continue Lamictal as prescribed.  1. Bipolar 1 disorder (HCC)  Continue- lamoTRIgine (LAMICTAL) 200 MG tablet; TAKE 1 TABLET (200 MG TOTAL) BY MOUTH 2 (TWO) TIMES DAILY.  Dispense: 60 tablet;  Refill: 3 - CBC w/Diff/Platelet - Hepatic function panel - Thyroid Panel With TSH - HgB A1c - Basic Metabolic Panel (BMET)  2. Generalized anxiety disorder  Start- escitalopram (LEXAPRO) 10 MG tablet; Take 1 tablet (10 mg total) by mouth daily.  Dispense: 30 tablet; Refill: 3     Follow-up in 23-month Follow-up with therapy  01/19/2021, NP 03/06/2022, 1:32 PM

## 2022-03-26 ENCOUNTER — Ambulatory Visit (INDEPENDENT_AMBULATORY_CARE_PROVIDER_SITE_OTHER): Payer: No Payment, Other | Admitting: Mental Health

## 2022-03-26 ENCOUNTER — Encounter (HOSPITAL_COMMUNITY): Payer: Self-pay

## 2022-03-26 DIAGNOSIS — F315 Bipolar disorder, current episode depressed, severe, with psychotic features: Secondary | ICD-10-CM
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
## 2022-04-04 ENCOUNTER — Encounter: Payer: Self-pay | Admitting: Critical Care Medicine

## 2022-04-04 ENCOUNTER — Other Ambulatory Visit: Payer: Self-pay

## 2022-04-04 ENCOUNTER — Ambulatory Visit: Payer: Self-pay | Attending: Critical Care Medicine | Admitting: Critical Care Medicine

## 2022-04-04 VITALS — BP 128/77 | HR 84 | Ht 61.0 in | Wt 129.2 lb

## 2022-04-04 DIAGNOSIS — E785 Hyperlipidemia, unspecified: Secondary | ICD-10-CM | POA: Insufficient documentation

## 2022-04-04 DIAGNOSIS — F17210 Nicotine dependence, cigarettes, uncomplicated: Secondary | ICD-10-CM | POA: Insufficient documentation

## 2022-04-04 DIAGNOSIS — J40 Bronchitis, not specified as acute or chronic: Secondary | ICD-10-CM

## 2022-04-04 DIAGNOSIS — J440 Chronic obstructive pulmonary disease with acute lower respiratory infection: Secondary | ICD-10-CM | POA: Insufficient documentation

## 2022-04-04 DIAGNOSIS — F315 Bipolar disorder, current episode depressed, severe, with psychotic features: Secondary | ICD-10-CM

## 2022-04-04 DIAGNOSIS — F411 Generalized anxiety disorder: Secondary | ICD-10-CM

## 2022-04-04 DIAGNOSIS — F17200 Nicotine dependence, unspecified, uncomplicated: Secondary | ICD-10-CM

## 2022-04-04 DIAGNOSIS — E782 Mixed hyperlipidemia: Secondary | ICD-10-CM

## 2022-04-04 DIAGNOSIS — F319 Bipolar disorder, unspecified: Secondary | ICD-10-CM

## 2022-04-04 DIAGNOSIS — K219 Gastro-esophageal reflux disease without esophagitis: Secondary | ICD-10-CM

## 2022-04-04 DIAGNOSIS — F17290 Nicotine dependence, other tobacco product, uncomplicated: Secondary | ICD-10-CM | POA: Insufficient documentation

## 2022-04-04 DIAGNOSIS — Z23 Encounter for immunization: Secondary | ICD-10-CM

## 2022-04-04 DIAGNOSIS — J9801 Acute bronchospasm: Secondary | ICD-10-CM

## 2022-04-04 DIAGNOSIS — J309 Allergic rhinitis, unspecified: Secondary | ICD-10-CM

## 2022-04-04 DIAGNOSIS — M6289 Other specified disorders of muscle: Secondary | ICD-10-CM

## 2022-04-04 DIAGNOSIS — J4489 Other specified chronic obstructive pulmonary disease: Secondary | ICD-10-CM

## 2022-04-04 MED ORDER — BENZONATATE 100 MG PO CAPS
200.0000 mg | ORAL_CAPSULE | Freq: Three times a day (TID) | ORAL | 0 refills | Status: DC | PRN
  Filled 2022-04-04: qty 40, 7d supply, fill #0

## 2022-04-04 MED ORDER — OMEPRAZOLE 20 MG PO CPDR
20.0000 mg | DELAYED_RELEASE_CAPSULE | Freq: Every day | ORAL | 2 refills | Status: DC
  Filled 2022-04-04: qty 30, 30d supply, fill #0
  Filled 2022-05-29: qty 30, 30d supply, fill #1

## 2022-04-04 MED ORDER — ESTRADIOL 0.1 MG/24HR TD PTTW
1.0000 | MEDICATED_PATCH | TRANSDERMAL | 3 refills | Status: DC
  Filled 2022-04-04 – 2022-06-12 (×2): qty 8, 24d supply, fill #0

## 2022-04-04 MED ORDER — FLUTICASONE-SALMETEROL 100-50 MCG/ACT IN AEPB
1.0000 | INHALATION_SPRAY | Freq: Two times a day (BID) | RESPIRATORY_TRACT | 3 refills | Status: DC
  Filled 2022-04-04: qty 60, 30d supply, fill #0

## 2022-04-04 MED ORDER — ALBUTEROL SULFATE HFA 108 (90 BASE) MCG/ACT IN AERS
2.0000 | INHALATION_SPRAY | RESPIRATORY_TRACT | 1 refills | Status: DC | PRN
  Filled 2022-04-04: qty 6.7, 17d supply, fill #0

## 2022-04-04 MED ORDER — CETIRIZINE HCL 10 MG PO TABS
10.0000 mg | ORAL_TABLET | Freq: Every day | ORAL | 1 refills | Status: DC | PRN
  Filled 2022-04-04 – 2022-04-25 (×3): qty 30, 30d supply, fill #0

## 2022-04-04 MED ORDER — PRAVASTATIN SODIUM 40 MG PO TABS
40.0000 mg | ORAL_TABLET | Freq: Every day | ORAL | 1 refills | Status: DC
  Filled 2022-04-04: qty 30, 30d supply, fill #0
  Filled 2022-05-03: qty 30, 30d supply, fill #1
  Filled 2022-05-29: qty 30, 30d supply, fill #2
  Filled 2022-07-10: qty 30, 30d supply, fill #3

## 2022-04-04 NOTE — Assessment & Plan Note (Signed)
Given Kegel exercises and referral to gynecology

## 2022-04-04 NOTE — Assessment & Plan Note (Signed)
Care per mental health

## 2022-04-04 NOTE — Assessment & Plan Note (Signed)
Check lipids continue pravastatin

## 2022-04-04 NOTE — Assessment & Plan Note (Signed)
Due to chronic tobacco use smoking counseling given continue albuterol if needed and Advair as prescribed

## 2022-04-04 NOTE — Assessment & Plan Note (Signed)
    Current smoking consumption amount: 1/2 PPD  Dicsussion on advise to quit smoking and smoking impacts: CV impacts  Patient's willingness to quit:  Not engaged to quit  Methods to quit smoking discussed:  behav mod  Medication management of smoking session drugs discussed:none  Resources provided:  AVS   Setting quit date not established  Follow-up arranged 4 mo   Time spent counseling the patient:  5 min

## 2022-04-04 NOTE — Assessment & Plan Note (Signed)
Maintain PPI therapy

## 2022-04-04 NOTE — Progress Notes (Signed)
Ring finger on right hand "locks" up.

## 2022-04-04 NOTE — Patient Instructions (Signed)
Stop smoking use nicotine lozenges we discussed  Refills on all medications sent to pharmacy  Flu vaccine given and you will come back in 2 weeks for your first shingles vaccine and the second will be 6 weeks later  Gynecology referral given use the Kegel exercises we discussed for your pelvic floor  Wear a wrist finger brace on the right hand if this does not work then we will refer you to orthopedics  Get a orthotic insert in your shoes for your plantar fascia pain foot  Return to Dr. Pinkey Mcjunkin 50month

## 2022-04-04 NOTE — Progress Notes (Signed)
Established Patient Office Visit  Subjective   Patient ID: Chloe [name], female    DOB: April 28, 1965  Age: 57 y.o. MRN: [id number]  Chief Complaint  Patient presents with   Hyperlipidemia    08/2021 Ms Graylon Good is a 57 year old female presenting for follow up today. She has a history of Bipolar 1, hyperlipidemia, asthma, GERD and tobacco use. She is being seen by ... Services for her Bipolar, who have added carbamazepine to her medication regimen in addition to her lamictal. She feels like this is working well for her.  She also reports some increased shortness of breath requiring her to use albuterol every morning, she believes this is allergy triggered and would like a refill on zyrtec.  Patient is also currently using estradiol patch for post menopausal symptoms but does not feel like this has ever done an adequate job of controlling sweating.  She has a history of GERD and is no longer using omeprazole but is taking tums daily.  She is still smoking but has reduced recently as she is now living with brother and does not have income to purchase her own. Reports about 5 cigarettes a day.  Patient has history of right breast lumpectomy in 2018 and is due to for a mammogram screening.  Reports some left sided thigh pain on and off for a long time, no acute changes  04/04/22 Patient seen in return follow-up patient notes she is having the right ring and small finger lock up on her this has been progressive for the past 2 months.  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  Patient also is having incontinence which has been a chronic problem from pelvic floor dysfunction.  She is now smoking 3 cigarettes a day had been vaping as well trying to quit cigarettes  Patient was seen previously by our PA and started on antibiotics and inhaler with acute bronchitis she still has some wheezing.     Past Medical History:  Diagnosis Date   Anxiety    Bipolar disorder ([place])    Breast mass, right    GERD  (gastroesophageal reflux disease)    Hyperlipidemia    Post-operative nausea and vomiting    Smoker    Past Surgical History:  Procedure Laterality Date   ABDOMINAL HYSTERECTOMY     had nausea and vomiting post-op   BREAST LUMPECTOMY WITH RADIOACTIVE SEED LOCALIZATION Right 11/12/2016   Procedure: RIGHT BREAST LUMPECTOMY WITH RADIOACTIVE SEED LOCALIZATION;  Surgeon: Erroll Luna, MD;  Location: [facility];  Service: General;  Laterality: Right;   CHOLECYSTECTOMY     LAPAROSCOPIC LYSIS OF ADHESIONS     MYOMECTOMY     PLANTAR FASCIA SURGERY Left    Social History   Tobacco Use   Smoking status: Some Days    Packs/day: 0.50    Types: Cigarettes   Smokeless tobacco: Never  Vaping Use   Vaping Use: Never used  Substance Use Topics   Alcohol use: Not Currently    Comment: social   Drug use: No   Family History  Problem Relation Age of Onset   Lung cancer Mother    Breast cancer Maternal Aunt    Hyperlipidemia Brother    Colon cancer Neg Hx    Esophageal cancer Neg Hx    Stomach cancer Neg Hx    Allergies  Allergen Reactions   Penicillins Hives    Has patient had a PCN reaction causing immediate rash, facial/tongue/throat swelling, SOB or lightheadedness  with hypotension: Yes Has patient had a PCN reaction causing severe rash involving mucus membranes or skin necrosis: No Has patient had a PCN reaction that required hospitalization: No Has patient had a PCN reaction occurring within the last 10 years: Yes If all of the above answers are "NO", then may proceed with Cephalosporin use.   Demerol [Meperidine]       Review of Systems  Constitutional: Negative.  Negative for chills, diaphoresis, fever, malaise/fatigue and weight loss.  HENT:  Negative for congestion, hearing loss, nosebleeds, sore throat and tinnitus.   Eyes:  Negative for blurred vision, photophobia and redness.  Respiratory:  Positive for cough and shortness of breath. Negative for  hemoptysis, sputum production, wheezing and stridor.   Cardiovascular:  Negative for chest pain, palpitations, orthopnea, claudication, leg swelling and PND.  Gastrointestinal:  Positive for heartburn. Negative for abdominal pain, blood in stool, constipation, diarrhea, nausea and vomiting.  Genitourinary:  Positive for frequency and urgency. Negative for dysuria, flank pain and hematuria.  Musculoskeletal:  Negative for back pain, falls, joint pain, myalgias and neck pain.       Left upper thigh pain  Skin:  Negative for itching and rash.  Neurological:  Negative for dizziness, tingling, tremors, sensory change, speech change, focal weakness, seizures, loss of consciousness, weakness and headaches.  Endo/Heme/Allergies:  Negative for environmental allergies and polydipsia. Does not bruise/bleed easily.  Psychiatric/Behavioral:  Negative for depression, memory loss, substance abuse and suicidal ideas. The patient is not nervous/anxious and does not have insomnia.   All other systems reviewed and are negative.     Objective:     BP 128/77   Pulse 84   Ht 5' 1"$  (1.549 m)   Wt 129 lb 3.2 oz (58.6 kg)   SpO2 96%   BMI 24.41 kg/m    Physical Exam Vitals reviewed.  Constitutional:      Appearance: Normal appearance. She is well-developed. She is not diaphoretic.  HENT:     Head: Normocephalic and atraumatic.     Nose: Nose normal. No nasal deformity, septal deviation, mucosal edema or rhinorrhea.     Right Sinus: No maxillary sinus tenderness or frontal sinus tenderness.     Left Sinus: No maxillary sinus tenderness or frontal sinus tenderness.     Mouth/Throat:     Mouth: Mucous membranes are moist.     Pharynx: Oropharynx is clear. No oropharyngeal exudate.  Eyes:     General: No scleral icterus.    Conjunctiva/sclera: Conjunctivae normal.     Pupils: Pupils are equal, round, and reactive to light.  Neck:     Thyroid: No thyromegaly.     Vascular: No carotid bruit or JVD.      Trachea: Trachea normal. No tracheal tenderness or tracheal deviation.  Cardiovascular:     Rate and Rhythm: Normal rate and regular rhythm.     Chest Wall: PMI is not displaced.     Pulses: Normal pulses. No decreased pulses.     Heart sounds: Normal heart sounds, S1 normal and S2 normal. Heart sounds not distant. No murmur heard.    No systolic murmur is present.     No diastolic murmur is present.     No friction rub. No gallop. No S3 or S4 sounds.  Pulmonary:     Effort: Pulmonary effort is normal. No tachypnea, accessory muscle usage or respiratory distress.     Breath sounds: No stridor. No decreased breath sounds, wheezing, rhonchi or rales.  Comments: Distant breath sounds Chest:     Chest wall: No tenderness.  Abdominal:     General: Bowel sounds are normal. There is no distension.     Palpations: Abdomen is soft. Abdomen is not rigid.     Tenderness: There is no abdominal tenderness. There is no guarding or rebound.  Musculoskeletal:        General: No swelling or deformity. Normal range of motion.     Cervical back: Normal range of motion and neck supple. No edema, erythema or rigidity. No muscular tenderness. Normal range of motion.  Lymphadenopathy:     Head:     Right side of head: No submental or submandibular adenopathy.     Left side of head: No submental or submandibular adenopathy.     Cervical: No cervical adenopathy.  Skin:    General: Skin is warm and dry.     Coloration: Skin is not pale.     Findings: No rash.     Nails: There is no clubbing.  Neurological:     Mental Status: She is alert and oriented to person, place, and time.     Sensory: No sensory deficit.  Psychiatric:        Mood and Affect: Mood normal.        Speech: Speech normal.        Behavior: Behavior normal.      No results found for any visits on 04/04/22.    The 10-year ASCVD risk score (Arnett DK, et al., 2019) is: 5.3%    Assessment & Plan:   Problem List Items  Addressed This Visit       Respiratory   Allergic rhinitis   Relevant Medications   cetirizine (ZYRTEC ALLERGY) 10 MG tablet   COPD with chronic bronchitis    Due to chronic tobacco use smoking counseling given continue albuterol if needed and Advair as prescribed      Relevant Medications   albuterol (PROVENTIL HFA) 108 (90 Base) MCG/ACT inhaler   benzonatate (TESSALON) 100 MG capsule   cetirizine (ZYRTEC ALLERGY) 10 MG tablet   fluticasone-salmeterol (ADVAIR) 100-50 MCG/ACT AEPB     Digestive   GERD (gastroesophageal reflux disease)    Maintain PPI therapy      Relevant Medications   omeprazole (PRILOSEC) 20 MG capsule     Musculoskeletal and Integument   Pelvic floor dysfunction    Given Kegel exercises and referral to gynecology      Relevant Orders   Ambulatory referral to Gynecology     Other   Bipolar affective disorder, depressed, severe, with psychotic behavior ([place])   Bipolar 1 disorder ([place])    Care per mental health      Mixed hyperlipidemia    Check lipids continue pravastatin      Relevant Medications   pravastatin (PRAVACHOL) 40 MG tablet   Other Relevant Orders   Lipid panel   Tobacco dependency       Current smoking consumption amount: 1/2 PPD  Dicsussion on advise to quit smoking and smoking impacts: CV impacts  Patient's willingness to quit:  Not engaged to quit  Methods to quit smoking discussed:  behav mod  Medication management of smoking session drugs discussed:none  Resources provided:  AVS   Setting quit date not established  Follow-up arranged 4 mo   Time spent counseling the patient:  5 min       Other Visit Diagnoses     Bronchitis    -  Primary  Relevant Medications   benzonatate (TESSALON) 100 MG capsule   Bronchospasm       Relevant Medications   albuterol (PROVENTIL HFA) 108 (90 Base) MCG/ACT inhaler   benzonatate (TESSALON) 100 MG capsule   fluticasone-salmeterol (ADVAIR) 100-50 MCG/ACT AEPB    Generalized anxiety disorder       Need for immunization against influenza       Relevant Orders   Flu Vaccine QUAD 73moIM (Fluarix, Fluzone & Alfiuria Quad PF) (Completed)     38 min spent on hx px and patient educations and complex decision is high Flu vaccine given Return in about 5 months (around 09/02/2022) for chronic conditions.    PAsencion Noble MD

## 2022-04-05 LAB — LIPID PANEL
Chol/HDL Ratio: 3.4 ratio (ref 0.0–4.4)
Cholesterol, Total: 151 mg/dL (ref 100–199)
HDL: 44 mg/dL (ref >39)
LDL Chol Calc (NIH): 86 mg/dL (ref 0–99)
Triglycerides: 118 mg/dL (ref 0–149)
VLDL Cholesterol Cal: 21 mg/dL (ref 5–40)

## 2022-04-05 NOTE — Progress Notes (Signed)
Let pt know cholesterol is at goal no change on cholesterol med

## 2022-04-09 ENCOUNTER — Telehealth: Payer: Self-pay

## 2022-04-09 NOTE — Telephone Encounter (Signed)
Pt was called and is aware of results, DOB was confirmed.  ?

## 2022-04-09 NOTE — Telephone Encounter (Signed)
-----   Message from Elsie Stain, MD sent at 04/05/2022  7:03 AM EST ----- Let pt know cholesterol is at goal no change on cholesterol med

## 2022-04-12 ENCOUNTER — Other Ambulatory Visit (HOSPITAL_COMMUNITY): Payer: Self-pay

## 2022-04-17 ENCOUNTER — Ambulatory Visit: Payer: Self-pay | Attending: Critical Care Medicine

## 2022-04-17 ENCOUNTER — Other Ambulatory Visit: Payer: Self-pay

## 2022-04-17 DIAGNOSIS — Z23 Encounter for immunization: Secondary | ICD-10-CM

## 2022-04-18 NOTE — Progress Notes (Signed)
Late entry for 04/17/2022- shingles vaccine administered in right deltoid per protocols.  Information sheet given. Patient denies and pain or discomfort at injection site. Tolerated injection well no reaction.

## 2022-04-25 ENCOUNTER — Other Ambulatory Visit: Payer: Self-pay

## 2022-04-25 ENCOUNTER — Ambulatory Visit (INDEPENDENT_AMBULATORY_CARE_PROVIDER_SITE_OTHER): Payer: No Payment, Other | Admitting: Mental Health

## 2022-04-25 DIAGNOSIS — F315 Bipolar disorder, current episode depressed, severe, with psychotic features: Secondary | ICD-10-CM

## 2022-04-25 DIAGNOSIS — F411 Generalized anxiety disorder: Secondary | ICD-10-CM

## 2022-04-25 NOTE — Progress Notes (Signed)
   THERAPIST PROGRESS NOTE  Session Time: 2:36pm ( 53 minutes)  Participation Level: Active  Behavioral Response: CasualConfusedAnxious  Type of Therapy: Individual Therapy  Treatment Goals addressed: STG: [place] will increase management of stressors and mood AEB development of x 3 effective coping skills with daily medication compliance within the next 90 days    ProgressTowards Goals: Initial  Interventions: Supportive  Summary: Chloe [name] is a 57 y.o. female who presents with chief complaint of anxiety. Dx of bipolar disorder with psychotic behavior and generalized anxiety. Chloe [name] presents for session alert and oriented; mood and affect anxious/stress. Speech clear and at pressured rate and tone. Talkative. Thought process vague and at times hard to follow. Shares difficulty getting along with step niece whom she lives with. Notes thoughts on niece to be 'petty' and 'nick picky'. Shares for there to be a high degree of aruging and conflict in the home and denies to want to argue due to a toddler being in the home. Shares attempts to stay out of the way and ot engage, however shares this can be difficulty. Shares she can use a small amount of something and then niece will want her to replace the whole thing. Shares desire to live at the hospital in exchange for cleaning, cooking and doing laundry and inquires of this possibility with therapist. 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 Shares difficulty coping with stress at home. Notes medication compliance although niece does not think she is taking her medications. Explores with therapist coping skills to support in increase management of stress. No SI/HI. Sxs unchanged at this time; initial treatment plan- no progress.   Suicidal/Homicidal: Nowithout intent/plan  Therapist Response: therapist engaged  Chloe [name] in therapy session. Reviewed assessment, bounds of confidentiality and informed consent. Provided safe space for Chloe [name] to share concern for stressor and navigating living in home with niece. Provided support and encouragement; validated feelings. Assessed for current level of functioning and ability to cope with stressors. Assessed for medication compliance. Provided education on inability to reside in hospital and provided admission criteria. Explored with Chloe [name] housing options and contacting low income/income based housing. Encouraged to discuss ability to reside in motel with management. Explored ability to remove self from conflict and remaining calm during conflict. Encouraged to follow through on additional housing options.   Plan: Return again in  x 4 weeks.  Diagnosis: Bipolar affective disorder, depressed, severe, with psychotic behavior ([place])  Generalized anxiety disorder  Collaboration of Care: Other None  Patient/Guardian was advised Release of Information must be obtained prior to any record release in order to collaborate their care with an outside provider. Patient/Guardian was advised if they have not already done so to contact the registration department to sign all necessary forms in order for [place] to release information regarding their care.   Consent: Patient/Guardian gives verbal consent for treatment and assignment of benefits for services provided during this visit. Patient/Guardian expressed understanding and agreed to proceed.   Rockey Situ [place], [facility] 04/26/2022

## 2022-05-03 ENCOUNTER — Other Ambulatory Visit: Payer: Self-pay

## 2022-05-22 ENCOUNTER — Telehealth (HOSPITAL_COMMUNITY): Payer: No Payment, Other | Admitting: Psychiatry

## 2022-05-22 ENCOUNTER — Encounter (HOSPITAL_COMMUNITY): Payer: Self-pay

## 2022-05-23 ENCOUNTER — Other Ambulatory Visit: Payer: Self-pay

## 2022-05-29 ENCOUNTER — Other Ambulatory Visit: Payer: Self-pay

## 2022-05-29 ENCOUNTER — Ambulatory Visit (HOSPITAL_COMMUNITY): Payer: No Payment, Other | Admitting: Mental Health

## 2022-05-29 DIAGNOSIS — F315 Bipolar disorder, current episode depressed, severe, with psychotic features: Secondary | ICD-10-CM

## 2022-05-29 DIAGNOSIS — F411 Generalized anxiety disorder: Secondary | ICD-10-CM

## 2022-05-29 NOTE — Progress Notes (Signed)
   THERAPIST PROGRESS NOTE  Session Time: 2:05 pm   Participation Level: Active  Behavioral Response: CasualAlertDysphoric  Type of Therapy: Individual Therapy  Treatment Goals addressed: STG: Chloe [name] will increase management of stressors and mood AEB development of x 3 effective coping skills with daily medication compliance within the next 90 days     ProgressTowards Goals: Progressing  Interventions: CBT and Supportive  Summary: Chloe [name] is a 57 y.o. female who presents with chief complaint of anxiety/stress. Dx of bipolar disorder with psychotic behavior and generalized anxiety. Chloe [name] presents for session alert and oriented; mood and affect anxious/stress. Speech clear and at pressured rate and tone. Talkative. Thought process vague and loose associations and difficult to follow. Thought process scattered. Notes concern for housing and desire to live where else. Notes brother will not allow her to return to live with him, shares to have had a house with ex-husband that she reports to have the key too she can go. States he has told her she could stay there but then shares to have not been able to speak to him and family does not allow her to contact him. Shares for sister in law to have handled disability case for her but it was reported she was denied however is unclear about that. Thoughts of wanting to reside at a hospital. Notes thoughts of employer not wanting her to be there but " I don't know." Has attempted to get other positions and has asked individuals if she can clean their home. Notes at current home attempts to stay out of the way. Ongoing stressors. Sxs unchanged. Denies SI/HI. Agrees to follow up with services in [facility] and other referrals provided.     Suicidal/Homicidal: Nowithout intent/plan  Therapist Response: Therapist engaged [name] in therapy session. Reviewed assessment, bounds of confidentiality and informed consent. Provided safe space for  Chloe [name] to share concern for stressor and navigating living in home with niece. Provided support and encouragement; validated feelings. Assessed for current level of functioning and ability to cope with stressors. Assessed for medication compliance. Encouraged Chloe [name] to follow up on status of disability case. Educated no low income housing in [place] and present to DSS in her county to obtain list. Reviewed need to present for services in county of residence. Provided contact information for Vocational rehabilitation, OPT services and DSS in [facility]. Reviewed session. No follow up at this time due to county of Residence.   Plan: Return again in 0 weeks. Follow up with [facility]  Diagnosis: Bipolar affective disorder, depressed, severe, with psychotic behavior  Generalized anxiety disorder  Collaboration of Care: Other None  Patient/Guardian was advised Release of Information must be obtained prior to any record release in order to collaborate their care with an outside provider. Patient/Guardian was advised if they have not already done so to contact the registration department to sign all necessary forms in order for [place] to release information regarding their care.   Consent: Patient/Guardian gives verbal consent for treatment and assignment of benefits for services provided during this visit. Patient/Guardian expressed understanding and agreed to proceed.   Stephan Minister Edgard, [facility] 05/29/2022

## 2022-05-30 ENCOUNTER — Other Ambulatory Visit: Payer: Self-pay

## 2022-06-12 ENCOUNTER — Other Ambulatory Visit: Payer: Self-pay

## 2022-07-01 ENCOUNTER — Ambulatory Visit (INDEPENDENT_AMBULATORY_CARE_PROVIDER_SITE_OTHER): Payer: Self-pay | Admitting: Obstetrics and Gynecology

## 2022-07-01 ENCOUNTER — Other Ambulatory Visit: Payer: Self-pay

## 2022-07-01 ENCOUNTER — Encounter: Payer: Self-pay | Admitting: Obstetrics and Gynecology

## 2022-07-01 VITALS — BP 122/68 | HR 68 | Wt 132.6 lb

## 2022-07-01 DIAGNOSIS — Z79890 Hormone replacement therapy (postmenopausal): Secondary | ICD-10-CM

## 2022-07-01 DIAGNOSIS — K649 Unspecified hemorrhoids: Secondary | ICD-10-CM

## 2022-07-01 DIAGNOSIS — R3129 Other microscopic hematuria: Secondary | ICD-10-CM

## 2022-07-01 DIAGNOSIS — Z01419 Encounter for gynecological examination (general) (routine) without abnormal findings: Secondary | ICD-10-CM

## 2022-07-01 LAB — POCT URINALYSIS DIP (DEVICE)
Bilirubin Urine: NEGATIVE
Glucose, UA: NEGATIVE mg/dL
Ketones, ur: NEGATIVE mg/dL
Leukocytes,Ua: NEGATIVE
Nitrite: NEGATIVE
Protein, ur: NEGATIVE mg/dL
Specific Gravity, Urine: >=1.03 (ref 1.005–1.030)
Urobilinogen, UA: 0.2 mg/dL (ref 0.0–1.0)
pH: 6 (ref 5.0–8.0)

## 2022-07-01 NOTE — Progress Notes (Unsigned)
PHQ-9 and GAD-7 positive; endorses some SI within the past week but denies having any plan for harm. Following with [facility] mental health counselor and psychiatry team.   Fleet Contras RN 07/01/22

## 2022-07-01 NOTE — Progress Notes (Signed)
Obstetrics and Gynecology New Patient Evaluation  Appointment Date: 07/01/2022  OBGYN Clinic: Center for [facility]   Primary Care Provider: Storm Frisk  Referring Provider: Storm Frisk, MD  Chief Complaint:  Chief Complaint  Patient presents with   Gynecologic Exam    History of Present Illness: Chloe [name] is a 57 y.o. Caucasian G1P0010 (No LMP recorded. Patient has had a hysterectomy.), seen for the above chief complaint.   Patient hasn't seen a GYN in several years and wanted to make sure everything was okay. She has a h/o 2005 l/s LS and 2006 TLH/RS and LSO for benign reasons. She also noticed, a few weeks ago, ?hemorrhoid with stable s/s. GI ROS negative and she states she's had a negative colonoscopy in the past 10 years.   No GYN s/s except for stable stress urinary incontinence.   Review of Systems: Pertinent items noted in HPI and remainder of comprehensive ROS otherwise negative.   Patient Active Problem List   Diagnosis Date Noted   COPD with chronic bronchitis 04/04/2022   Pelvic floor dysfunction 04/04/2022   GERD (gastroesophageal reflux disease) 08/30/2021   Allergic rhinitis 08/30/2021   History of breast lump/mass excision 08/30/2021   Post menopausal syndrome 08/30/2021   Mixed hyperlipidemia 07/19/2020   Encounter for health-related screening 07/19/2020   Tobacco dependency 07/19/2020   Financial difficulties 04/12/2020   Bipolar affective disorder, depressed, severe, with psychotic behavior (HCC) 11/24/2017   Bipolar 1 disorder (HCC) 11/24/2017    Past Medical History:  Past Medical History:  Diagnosis Date   Anxiety    Bipolar disorder (HCC)    Breast mass, right    GERD (gastroesophageal reflux disease)    Hyperlipidemia    Post-operative nausea and vomiting    Smoker     Past Surgical History:  Past Surgical History:  Procedure Laterality Date   ABDOMINAL HYSTERECTOMY     had nausea and  vomiting post-op   BREAST LUMPECTOMY WITH RADIOACTIVE SEED LOCALIZATION Right 11/12/2016   Procedure: RIGHT BREAST LUMPECTOMY WITH RADIOACTIVE SEED LOCALIZATION;  Surgeon: Harriette Bouillon, MD;  Location: [facility];  Service: General;  Laterality: Right;   CHOLECYSTECTOMY     LAPAROSCOPIC LYSIS OF ADHESIONS     MYOMECTOMY     PLANTAR FASCIA SURGERY Left     Past Obstetrical History:  OB History  Gravida Para Term Preterm AB Living  1       1    SAB IAB Ectopic Multiple Live Births          0    # Outcome Date GA Lbr Len/2nd Weight Sex Delivery Anes PTL Lv  1 AB             Obstetric Comments  57 y/o EAB    Past Gynecological History: As per HPI.  Social History:  Social History   Socioeconomic History   Marital status: Divorced    Spouse name: Not on file   Number of children: Not on file   Years of education: Not on file   Highest education level: Associate degree: occupational, Scientist, product/process development, or vocational program  Occupational History   Not on file  Tobacco Use   Smoking status: Every Day    Packs/day: .5    Types: Cigarettes   Smokeless tobacco: Never  Vaping Use   Vaping Use: Never used  Substance and Sexual Activity   Alcohol use: Not Currently    Comment: social   Drug use: No  Sexual activity: Not Currently  Other Topics Concern   Not on file  Social History Narrative   Not on file   Social Determinants of Health   Financial Resource Strain: High Risk (03/26/2022)   Overall Financial Resource Strain (CARDIA)    Difficulty of Paying Living Expenses: Hard  Food Insecurity: Food Insecurity Present (07/01/2022)   Hunger Vital Sign    Worried About Programme researcher, broadcasting/film/video in the Last Year: Often true    Ran Out of Food in the Last Year: Often true  Transportation Needs: Unmet Transportation Needs (07/01/2022)   PRAPARE - Administrator, Civil Service (Medical): No    Lack of Transportation (Non-Medical): Yes  Physical Activity:  Inactive (03/26/2022)   Exercise Vital Sign    Days of Exercise per Week: 0 days    Minutes of Exercise per Session: 0 min  Stress: Stress Concern Present (03/26/2022)   [employer] of Occupational Health - Occupational Stress Questionnaire    Feeling of Stress : Very much  Social Connections: Socially Isolated (03/26/2022)   Social Connection and Isolation Panel [NHANES]    Frequency of Communication with Friends and Family: Twice a week    Frequency of Social Gatherings with Friends and Family: Once a week    Attends Religious Services: Never    Database administrator or Organizations: No    Attends Banker Meetings: Never    Marital Status: Divorced  Catering manager Violence: Not At Risk (03/26/2022)   Humiliation, Afraid, Rape, and Kick questionnaire    Fear of Current or Ex-Partner: No    Emotionally Abused: No    Physically Abused: No    Sexually Abused: No    Family History:  Family History  Problem Relation Age of Onset   Lung cancer Mother    Breast cancer Maternal Aunt    Hyperlipidemia Brother    Colon cancer Neg Hx    Esophageal cancer Neg Hx    Stomach cancer Neg Hx     Medications Lori-Ann P. Ilsa Iha had no medications administered during this visit. Current Outpatient Medications  Medication Sig Dispense Refill   albuterol (PROVENTIL HFA) 108 (90 Base) MCG/ACT inhaler Inhale 2 puffs into the lungs every 4 (four) hours as needed for wheezing or shortness of breath. 6.7 g 1   aspirin EC 81 MG tablet Take 81 mg by mouth daily. Swallow whole.     cetirizine (ZYRTEC ALLERGY) 10 MG tablet Take 1 tablet (10 mg total) by mouth daily as needed for allergies. 100 tablet 1   escitalopram (LEXAPRO) 10 MG tablet Take 1 tablet (10 mg total) by mouth daily. 30 tablet 3   estradiol (VIVELLE-DOT) 0.1 MG/24HR patch Place 1 patch (0.1 mg total) onto the skin every 3 (three) days. 10 patch 3   fluticasone-salmeterol (ADVAIR) 100-50 MCG/ACT AEPB Inhale 1 puff into the  lungs 2 (two) times daily. 60 each 3   lamoTRIgine (LAMICTAL) 200 MG tablet Take 1 tablet (200 mg total) by mouth 2 (two) times daily. 60 tablet 3   omeprazole (PRILOSEC) 20 MG capsule Take 1 capsule (20 mg total) by mouth daily. 30 capsule 2   pravastatin (PRAVACHOL) 40 MG tablet Take 1 tablet (40 mg total) by mouth daily. 90 tablet 1   benzonatate (TESSALON) 100 MG capsule Take 2 capsules (200 mg total) by mouth 3 (three) times daily as needed. (Patient not taking: Reported on 07/01/2022) 40 capsule 0   No current facility-administered medications for  this visit.    Allergies Penicillins and Demerol [meperidine]   Physical Exam:  BP 122/68   Pulse 68   Wt 132 lb 9.6 oz (60.1 kg)   BMI 25.05 kg/m  Body mass index is 25.05 kg/m. General appearance: Well nourished, well developed female in no acute distress.  Neck:  Supple, normal appearance, and no thyromegaly  Cardiovascular: normal s1 and s2.  No murmurs, rubs or gallops. Respiratory:  Clear to auscultation bilateral. Normal respiratory effort Abdomen: positive bowel sounds and no masses, hernias; diffusely non tender to palpation, non distended Neuro/Psych:  Normal mood and affect.  Skin:  Warm and dry.  Lymphatic:  No inguinal lymphadenopathy.   Cervical exam performed in the presence of a chaperone Pelvic exam: is not limited by body habitus EGBUS: within normal limits Vagina: within normal limits and with no blood or discharge in the vault Cervix: surgically absent Cuff: wnl Bimanual exam: negative Two small external hemorrhoids on the ventral aspect  Laboratory: udip with just trace blood  Radiology: none  Assessment: patient doing well  Plan:  1. Microscopic hematuria Patient states she was a patient of dr Billy Coast and has seen urology in the past for her SUI and hematuria and that she has been on medicines for the SUI in the past and her eval with urology for the hematuria was negative.   2. Hemorrhoids,  unspecified hemorrhoid type Recommend fiber to avoid constipation and expectant management  3. Hormone replacement therapy (HRT) Has been on the patch for several years with dr. Billy Coast but lost insurance coverage. Given her age and that she is a smoker, I told her that I recommend coming off the patch given VTE risk; she is having menopausal s/s like hot flashes and night sweats still with the patch. Pt is amenable to coming off.   RTC PRN   Future Appointments  Date Time Provider Department Center  07/16/2022  3:30 PM [facility] NURSE [facility] None  09/03/2022 10:30 AM Storm Frisk, MD [facility] None    Cornelia Copa MD Attending Center for [facility])

## 2022-07-02 DIAGNOSIS — R3129 Other microscopic hematuria: Secondary | ICD-10-CM | POA: Insufficient documentation

## 2022-07-02 LAB — BASIC METABOLIC PANEL
BUN/Creatinine Ratio: 13 (ref 9–23)
BUN: 9 mg/dL (ref 6–24)
CO2: 23 mmol/L (ref 20–29)
Calcium: 9.5 mg/dL (ref 8.7–10.2)
Chloride: 104 mmol/L (ref 96–106)
Creatinine, Ser: 0.68 mg/dL (ref 0.57–1.00)
Glucose: 86 mg/dL (ref 70–99)
Potassium: 4.2 mmol/L (ref 3.5–5.2)
Sodium: 140 mmol/L (ref 134–144)
eGFR: 103 mL/min/{1.73_m2} (ref >59)

## 2022-07-02 LAB — CBC WITH DIFFERENTIAL/PLATELET
Basophils Absolute: 0.1 10*3/uL (ref 0.0–0.2)
Basos: 2 %
EOS (ABSOLUTE): 0.4 10*3/uL (ref 0.0–0.4)
Eos: 5 %
Hematocrit: 44 % (ref 34.0–46.6)
Hemoglobin: 14.8 g/dL (ref 11.1–15.9)
Immature Grans (Abs): 0 10*3/uL (ref 0.0–0.1)
Immature Granulocytes: 0 %
Lymphocytes Absolute: 3.2 10*3/uL — ABNORMAL HIGH (ref 0.7–3.1)
Lymphs: 40 %
MCH: 30.8 pg (ref 26.6–33.0)
MCHC: 33.6 g/dL (ref 31.5–35.7)
MCV: 92 fL (ref 79–97)
Monocytes Absolute: 1.2 10*3/uL — ABNORMAL HIGH (ref 0.1–0.9)
Monocytes: 16 %
Neutrophils Absolute: 2.8 10*3/uL (ref 1.4–7.0)
Neutrophils: 37 %
Platelets: 359 10*3/uL (ref 150–450)
RBC: 4.81 x10E6/uL (ref 3.77–5.28)
RDW: 12.6 % (ref 11.7–15.4)
WBC: 7.7 10*3/uL (ref 3.4–10.8)

## 2022-07-02 LAB — THYROID PANEL WITH TSH
Free Thyroxine Index: 2.2 (ref 1.2–4.9)
T3 Uptake Ratio: 23 % — ABNORMAL LOW (ref 24–39)
T4, Total: 9.5 ug/dL (ref 4.5–12.0)
TSH: 0.84 u[IU]/mL (ref 0.450–4.500)

## 2022-07-02 LAB — HEMOGLOBIN A1C
Est. average glucose Bld gHb Est-mCnc: 108 mg/dL
Hgb A1c MFr Bld: 5.4 % (ref 4.8–5.6)

## 2022-07-02 LAB — HEPATIC FUNCTION PANEL
ALT: 16 IU/L (ref 0–32)
AST: 17 IU/L (ref 0–40)
Albumin: 4.2 g/dL (ref 3.8–4.9)
Alkaline Phosphatase: 116 IU/L (ref 44–121)
Bilirubin Total: <0.2 mg/dL (ref 0.0–1.2)
Bilirubin, Direct: <0.1 mg/dL (ref 0.00–0.40)
Total Protein: 6.8 g/dL (ref 6.0–8.5)

## 2022-07-10 ENCOUNTER — Other Ambulatory Visit: Payer: Self-pay

## 2022-07-11 ENCOUNTER — Other Ambulatory Visit: Payer: Self-pay

## 2022-07-16 ENCOUNTER — Ambulatory Visit: Payer: Self-pay | Attending: Critical Care Medicine

## 2022-07-16 DIAGNOSIS — Z23 Encounter for immunization: Secondary | ICD-10-CM

## 2022-07-16 NOTE — Progress Notes (Signed)
Shingrix vaccine administered  LEFT DELTOID per protocols.  Information sheet given. Patient denies and pain or discomfort at injection site. Tolerated injection well no reaction.

## 2022-08-14 ENCOUNTER — Other Ambulatory Visit (HOSPITAL_COMMUNITY): Payer: Self-pay | Admitting: Psychiatry

## 2022-08-14 ENCOUNTER — Other Ambulatory Visit: Payer: Self-pay

## 2022-08-14 DIAGNOSIS — F411 Generalized anxiety disorder: Secondary | ICD-10-CM

## 2022-08-14 DIAGNOSIS — F319 Bipolar disorder, unspecified: Secondary | ICD-10-CM

## 2022-08-27 ENCOUNTER — Other Ambulatory Visit: Payer: Self-pay

## 2022-08-30 ENCOUNTER — Telehealth (HOSPITAL_COMMUNITY): Payer: Self-pay | Admitting: Psychiatry

## 2022-08-30 ENCOUNTER — Other Ambulatory Visit (HOSPITAL_COMMUNITY): Payer: Self-pay | Admitting: Psychiatry

## 2022-08-30 ENCOUNTER — Other Ambulatory Visit: Payer: Self-pay

## 2022-08-30 DIAGNOSIS — F319 Bipolar disorder, unspecified: Secondary | ICD-10-CM

## 2022-08-30 DIAGNOSIS — F411 Generalized anxiety disorder: Secondary | ICD-10-CM

## 2022-08-30 MED ORDER — ESCITALOPRAM OXALATE 10 MG PO TABS
10.0000 mg | ORAL_TABLET | Freq: Every day | ORAL | 3 refills | Status: DC
Start: 2022-08-30 — End: 2022-09-06
  Filled 2022-08-30 (×2): qty 30, 30d supply, fill #0

## 2022-08-30 MED ORDER — LAMOTRIGINE 200 MG PO TABS
200.0000 mg | ORAL_TABLET | Freq: Two times a day (BID) | ORAL | 3 refills | Status: DC
Start: 2022-08-30 — End: 2022-09-06
  Filled 2022-08-30 (×2): qty 60, 30d supply, fill #0

## 2022-08-30 NOTE — Telephone Encounter (Signed)
Patient reports that she took her last dose less than a week ago.  Medication refilled and sent to preferred pharmacy.

## 2022-09-01 NOTE — Progress Notes (Signed)
Established Patient Office Visit  Subjective   Patient ID: Chloe [name], female    DOB: 1966-02-17  Age: 57 y.o. MRN: [id number]  Chief Complaint  Patient presents with   Medication Refill    Patient stated she wants to also go over what medication she needs to take and what she doesn't     08/2021 Chloe [name] is a 57 year old female presenting for follow up today. She has a history of Bipolar 1, hyperlipidemia, asthma, GERD and tobacco use. She is being seen by ... Services for her Bipolar, who have added carbamazepine to her medication regimen in addition to her lamictal. She feels like this is working well for her.  She also reports some increased shortness of breath requiring her to use albuterol every morning, she believes this is allergy triggered and would like a refill on zyrtec.  Patient is also currently using estradiol patch for post menopausal symptoms but does not feel like this has ever done an adequate job of controlling sweating.  She has a history of GERD and is no longer using omeprazole but is taking tums daily.  She is still smoking but has reduced recently as she is now living with brother and does not have income to purchase her own. Reports about 5 cigarettes a day.  Patient has history of right breast lumpectomy in 2018 and is due to for a mammogram screening.  Reports some left sided thigh pain on and off for a long time, no acute changes  04/04/22 Patient seen in return follow-up patient notes she is having the right ring and small finger lock up on her this has been progressive for the past 2 months.  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  Patient also is having incontinence which has been a chronic problem from pelvic floor dysfunction.  She is now smoking 3 cigarettes a day had been vaping as well trying to quit cigarettes  Patient was seen previously by our PA and started on antibiotics and inhaler with acute bronchitis she still has some  wheezing.  09/03/22 Patient seen in return follow-up.  History of stress urinary incontinence and hematuria.  She has been seen by urology who did not find pathology.  Saw gynecology who is following her incontinence at this time.  Patient is off estrogen therapy for menopause.  Patient states her incontinence has improved on its own.  Patient still smoking 1 pack a day of cigarettes.  Patient maintains albuterol as needed.  She is uninsured has a hard time affording medications.  Trying to get to a case manager Medicaid.  She will have to work through [facility].  She does not able to get orange card as she is not a [facility] resident.  There are no other complaints.     Past Medical History:  Diagnosis Date   Anxiety    Bipolar disorder (HCC)    Breast mass, right    GERD (gastroesophageal reflux disease)    Hyperlipidemia    Post-operative nausea and vomiting    Smoker    Past Surgical History:  Procedure Laterality Date   ABDOMINAL HYSTERECTOMY     had nausea and vomiting post-op   BREAST LUMPECTOMY WITH RADIOACTIVE SEED LOCALIZATION Right 11/12/2016   Procedure: RIGHT BREAST LUMPECTOMY WITH RADIOACTIVE SEED LOCALIZATION;  Surgeon: Harriette Bouillon, MD;  Location: [facility];  Service: General;  Laterality: Right;   CHOLECYSTECTOMY     LAPAROSCOPIC LYSIS OF ADHESIONS  MYOMECTOMY     PLANTAR FASCIA SURGERY Left    Social History   Tobacco Use   Smoking status: Every Day    Current packs/day: 0.50    Types: Cigarettes   Smokeless tobacco: Never  Vaping Use   Vaping status: Never Used  Substance Use Topics   Alcohol use: Not Currently    Comment: social   Drug use: No   Family History  Problem Relation Age of Onset   Lung cancer Mother    Breast cancer Maternal Aunt    Hyperlipidemia Brother    Colon cancer Neg Hx    Esophageal cancer Neg Hx    Stomach cancer Neg Hx    Allergies  Allergen Reactions   Penicillins Hives    Has patient  had a PCN reaction causing immediate rash, facial/tongue/throat swelling, SOB or lightheadedness with hypotension: Yes Has patient had a PCN reaction causing severe rash involving mucus membranes or skin necrosis: No Has patient had a PCN reaction that required hospitalization: No Has patient had a PCN reaction occurring within the last 10 years: Yes If all of the above answers are "NO", then may proceed with Cephalosporin use.   Demerol [Meperidine]       Review of Systems  Constitutional: Negative.  Negative for chills, diaphoresis, fever, malaise/fatigue and weight loss.  HENT:  Negative for congestion, hearing loss, nosebleeds, sore throat and tinnitus.   Eyes:  Negative for blurred vision, photophobia and redness.  Respiratory:  Negative for cough, hemoptysis, sputum production, shortness of breath, wheezing and stridor.   Cardiovascular:  Negative for chest pain, palpitations, orthopnea, claudication, leg swelling and PND.  Gastrointestinal:  Negative for abdominal pain, blood in stool, constipation, diarrhea, heartburn, nausea and vomiting.  Genitourinary:  Negative for dysuria, flank pain, frequency, hematuria and urgency.  Musculoskeletal:  Negative for back pain, falls, joint pain, myalgias and neck pain.       Left upper thigh pain  Skin:  Negative for itching and rash.  Neurological:  Negative for dizziness, tingling, tremors, sensory change, speech change, focal weakness, seizures, loss of consciousness, weakness and headaches.  Endo/Heme/Allergies:  Negative for environmental allergies and polydipsia. Does not bruise/bleed easily.  Psychiatric/Behavioral:  Negative for depression, memory loss, substance abuse and suicidal ideas. The patient is not nervous/anxious and does not have insomnia.   All other systems reviewed and are negative.     Objective:     BP 130/80 (BP Location: Left Arm, Patient Position: Sitting, Cuff Size: Normal)   Pulse 74   Wt 132 lb 3.2 oz (60 kg)    SpO2 92%   BMI 24.98 kg/m    Physical Exam Vitals reviewed.  Constitutional:      Appearance: Normal appearance. She is well-developed. She is not diaphoretic.  HENT:     Head: Normocephalic and atraumatic.     Nose: Nose normal. No nasal deformity, septal deviation, mucosal edema or rhinorrhea.     Right Sinus: No maxillary sinus tenderness or frontal sinus tenderness.     Left Sinus: No maxillary sinus tenderness or frontal sinus tenderness.     Mouth/Throat:     Mouth: Mucous membranes are moist.     Pharynx: Oropharynx is clear. No oropharyngeal exudate.  Eyes:     General: No scleral icterus.    Conjunctiva/sclera: Conjunctivae normal.     Pupils: Pupils are equal, round, and reactive to light.  Neck:     Thyroid: No thyromegaly.     Vascular: No carotid  bruit or JVD.     Trachea: Trachea normal. No tracheal tenderness or tracheal deviation.  Cardiovascular:     Rate and Rhythm: Normal rate and regular rhythm.     Chest Wall: PMI is not displaced.     Pulses: Normal pulses. No decreased pulses.     Heart sounds: Normal heart sounds, S1 normal and S2 normal. Heart sounds not distant. No murmur heard.    No systolic murmur is present.     No diastolic murmur is present.     No friction rub. No gallop. No S3 or S4 sounds.  Pulmonary:     Effort: Pulmonary effort is normal. No tachypnea, accessory muscle usage or respiratory distress.     Breath sounds: No stridor. No decreased breath sounds, wheezing, rhonchi or rales.     Comments: Distant breath sounds Chest:     Chest wall: No tenderness.  Abdominal:     General: Bowel sounds are normal. There is no distension.     Palpations: Abdomen is soft. Abdomen is not rigid.     Tenderness: There is no abdominal tenderness. There is no guarding or rebound.  Musculoskeletal:        General: No swelling or deformity. Normal range of motion.     Cervical back: Normal range of motion and neck supple. No edema, erythema or  rigidity. No muscular tenderness. Normal range of motion.  Lymphadenopathy:     Head:     Right side of head: No submental or submandibular adenopathy.     Left side of head: No submental or submandibular adenopathy.     Cervical: No cervical adenopathy.  Skin:    General: Skin is warm and dry.     Coloration: Skin is not pale.     Findings: No rash.     Nails: There is no clubbing.  Neurological:     Mental Status: She is alert and oriented to person, place, and time.     Sensory: No sensory deficit.  Psychiatric:        Mood and Affect: Mood normal.        Speech: Speech normal.        Behavior: Behavior normal.      No results found for any visits on 09/03/22.    The 10-year ASCVD risk score (Arnett DK, et al., 2019) is: 4.6%    Assessment & Plan:   Problem List Items Addressed This Visit       Respiratory   COPD with chronic bronchitis - Primary    Continue albuterol as needed        Digestive   GERD (gastroesophageal reflux disease)   Relevant Medications   omeprazole (PRILOSEC) 20 MG capsule     Genitourinary   Microscopic hematuria    Improved will monitor        Other   Bipolar affective disorder, depressed, severe, with psychotic behavior (HCC)    No longer psychotic continue with follow-up with psychiatry no change in medication      Financial difficulties    Referred patient to Medicaid coverage service      Mixed hyperlipidemia    Check liver function and continue pravastatin check cholesterol      Relevant Medications   pravastatin (PRAVACHOL) 40 MG tablet   Tobacco dependency       Current smoking consumption amount: 1/2 PPD  Dicsussion on advise to quit smoking and smoking impacts: CV impacts  Patient's willingness to quit:  Not engaged  to quit  Methods to quit smoking discussed:  behav mod  Medication management of smoking session drugs discussed:none  Resources provided:  AVS   Setting quit date not  established  Follow-up arranged 4 mo   Time spent counseling the patient:  5 min       Post menopausal syndrome    Monitor off estrogen therapy      38 min spent on hx px and patient educations and complex decision is high Flu vaccine given Return in about 6 months (around 03/06/2023) for primary care follow up.    Shan Levans, MD

## 2022-09-03 ENCOUNTER — Other Ambulatory Visit: Payer: Self-pay

## 2022-09-03 ENCOUNTER — Encounter: Payer: Self-pay | Admitting: Critical Care Medicine

## 2022-09-03 ENCOUNTER — Ambulatory Visit: Payer: Self-pay | Attending: Critical Care Medicine | Admitting: Critical Care Medicine

## 2022-09-03 VITALS — BP 130/80 | HR 74 | Wt 132.2 lb

## 2022-09-03 DIAGNOSIS — N951 Menopausal and female climacteric states: Secondary | ICD-10-CM

## 2022-09-03 DIAGNOSIS — J4489 Other specified chronic obstructive pulmonary disease: Secondary | ICD-10-CM

## 2022-09-03 DIAGNOSIS — Z599 Problem related to housing and economic circumstances, unspecified: Secondary | ICD-10-CM

## 2022-09-03 DIAGNOSIS — E782 Mixed hyperlipidemia: Secondary | ICD-10-CM

## 2022-09-03 DIAGNOSIS — F315 Bipolar disorder, current episode depressed, severe, with psychotic features: Secondary | ICD-10-CM

## 2022-09-03 DIAGNOSIS — R3129 Other microscopic hematuria: Secondary | ICD-10-CM

## 2022-09-03 DIAGNOSIS — F17200 Nicotine dependence, unspecified, uncomplicated: Secondary | ICD-10-CM

## 2022-09-03 DIAGNOSIS — Z780 Asymptomatic menopausal state: Secondary | ICD-10-CM | POA: Insufficient documentation

## 2022-09-03 DIAGNOSIS — K219 Gastro-esophageal reflux disease without esophagitis: Secondary | ICD-10-CM

## 2022-09-03 DIAGNOSIS — Z760 Encounter for issue of repeat prescription: Secondary | ICD-10-CM | POA: Insufficient documentation

## 2022-09-03 DIAGNOSIS — F314 Bipolar disorder, current episode depressed, severe, without psychotic features: Secondary | ICD-10-CM | POA: Insufficient documentation

## 2022-09-03 DIAGNOSIS — F17210 Nicotine dependence, cigarettes, uncomplicated: Secondary | ICD-10-CM | POA: Insufficient documentation

## 2022-09-03 DIAGNOSIS — Z79899 Other long term (current) drug therapy: Secondary | ICD-10-CM | POA: Insufficient documentation

## 2022-09-03 MED ORDER — PRAVASTATIN SODIUM 40 MG PO TABS
40.0000 mg | ORAL_TABLET | Freq: Every day | ORAL | 1 refills | Status: DC
Start: 2022-09-03 — End: 2023-03-06
  Filled 2022-09-03: qty 90, 90d supply, fill #0
  Filled 2023-01-27: qty 90, 90d supply, fill #1

## 2022-09-03 MED ORDER — OMEPRAZOLE 20 MG PO CPDR
20.0000 mg | DELAYED_RELEASE_CAPSULE | Freq: Every day | ORAL | 2 refills | Status: DC
Start: 2022-09-03 — End: 2023-01-10
  Filled 2022-09-03: qty 30, 30d supply, fill #0

## 2022-09-03 NOTE — Patient Instructions (Signed)
Go to [facility] office  Medicaid office located 4th floor [address]   No change medications refills sent  Work on smoking cessation  REturn 6 months

## 2022-09-03 NOTE — Assessment & Plan Note (Signed)
Check liver function and continue pravastatin check cholesterol

## 2022-09-03 NOTE — Assessment & Plan Note (Signed)
    Current smoking consumption amount: 1/2 PPD  Dicsussion on advise to quit smoking and smoking impacts: CV impacts  Patient's willingness to quit:  Not engaged to quit  Methods to quit smoking discussed:  behav mod  Medication management of smoking session drugs discussed:none  Resources provided:  AVS   Setting quit date not established  Follow-up arranged 4 mo   Time spent counseling the patient:  5 min

## 2022-09-03 NOTE — Assessment & Plan Note (Signed)
No longer psychotic continue with follow-up with psychiatry no change in medication

## 2022-09-03 NOTE — Assessment & Plan Note (Signed)
Continue albuterol as needed 

## 2022-09-03 NOTE — Assessment & Plan Note (Signed)
Referred patient to [facility] coverage service

## 2022-09-03 NOTE — Assessment & Plan Note (Signed)
Monitor off estrogen therapy

## 2022-09-03 NOTE — Assessment & Plan Note (Signed)
 Improved; will monitor

## 2022-09-06 ENCOUNTER — Encounter (HOSPITAL_COMMUNITY): Payer: Self-pay | Admitting: Psychiatry

## 2022-09-06 ENCOUNTER — Other Ambulatory Visit: Payer: Self-pay

## 2022-09-06 ENCOUNTER — Telehealth: Payer: Self-pay

## 2022-09-06 ENCOUNTER — Telehealth (HOSPITAL_COMMUNITY): Payer: No Payment, Other | Admitting: Psychiatry

## 2022-09-06 ENCOUNTER — Other Ambulatory Visit (HOSPITAL_COMMUNITY): Payer: Self-pay

## 2022-09-06 DIAGNOSIS — F319 Bipolar disorder, unspecified: Secondary | ICD-10-CM

## 2022-09-06 MED ORDER — FANAPT 6 MG PO TABS
6.0000 mg | ORAL_TABLET | Freq: Two times a day (BID) | ORAL | Status: DC
Start: 2022-09-06 — End: 2022-09-18

## 2022-09-06 MED ORDER — FANAPT 6 MG PO TABS
6.0000 mg | ORAL_TABLET | Freq: Two times a day (BID) | ORAL | 3 refills | Status: DC
Start: 2022-09-06 — End: 2022-09-18
  Filled 2022-09-06: qty 60, 30d supply, fill #0

## 2022-09-06 MED ORDER — FANAPT 6 MG PO TABS
6.0000 mg | ORAL_TABLET | Freq: Two times a day (BID) | ORAL | Status: DC
Start: 2022-09-10 — End: 2022-09-06

## 2022-09-06 MED ORDER — FANAPT 6 MG PO TABS: 6.0000 mg | ORAL_TABLET | Freq: Two times a day (BID) | ORAL | 0 refills | Status: DC

## 2022-09-06 MED ORDER — LAMOTRIGINE 200 MG PO TABS
200.0000 mg | ORAL_TABLET | Freq: Two times a day (BID) | ORAL | 3 refills | Status: DC
Start: 2022-09-06 — End: 2022-10-11
  Filled 2022-09-06: qty 60, 30d supply, fill #0

## 2022-09-06 MED ORDER — FANAPT TITRATION PACK 1 & 2 & 4 & 6 MG PO TABS
ORAL_TABLET | ORAL | Status: AC
Start: 2022-09-06 — End: ?

## 2022-09-06 NOTE — Telephone Encounter (Addendum)
Medication management - Telephone call with patient, after speaking with Dr. Doyne Keel to arrange for patient to come into the [facility] outpatient office today to pick up a 4 day titration pack of Fanapt, and then a 7 day supply of 6 mg, twice a day to start after this 4 day titration.  Reviewed with patient in the titration packet, Day 1 she would take a 1 mg tablet one in the morning and one in the evening, Day 2 she would take a 2 mg tablet, one in the morning and one in the evening, Day  3 she would take a 4 mg tablet, one in the morning and one in the evening, and then Day 4, a 6 mg tablet one in the morning and one in the evening.  After this patient would continue with her 7 day supply of provided Fanapt 6 mg, taking one in the morning and one in the evening.  Patient to call back if any issues with the medications or side effects.. Verbal orders for the 4 day Fanapt titration packet followed by a 7 day supply of samples, 6 mg, one twice a day number #14 given to this nurse by Dr. Toy Cookey, NP this date and sent to her for approval. Patient agrees to pick up this date and samples left for patient at our front desk and CMA, Georgian Co aware and will review one final time with patient when she comes in by 3 pm to pick up.

## 2022-09-06 NOTE — Progress Notes (Signed)
BH MD/PA/NP OP Progress Note Virtual Visit via Video Note  I connected with Chloe [name] on 09/06/22 at 11:30 AM EDT by a video enabled telemedicine application and verified that I am speaking with the correct person using two identifiers.  Location: Patient: Home Provider: Clinic   I discussed the limitations of evaluation and management by telemedicine and the availability of in person appointments. The patient expressed understanding and agreed to proceed.  I provided 30 minutes of non-face-to-face time during this encounter.      09/06/2022 3:28 PM IVY PURYEAR  MRN:  [id number]  Chief Complaint: "I have lots of question" Per sister in law "She is all over the place"    HPI: 57 year old female seen today for follow-up psychiatric evaluation.  She has a psychiatric history of bipolar affective disorder and tobacco dependence.  She is currently managed on Lamictal 200 mg twice daily and Lexapro 10 mg daily.  She reports that she was with out her medication for a few days but has restarted it. She notes that currently it is not effective in managing her psychiatric conditions.  Today she was well groomed, irritable, distracted, and had flight of ideas. She notes that she had lots of questions about her medication. She asked writer to tell her what active ingredients are in each of her medications. 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 She then became verbally aggressive with her sister in law and would ask the same question over and over again. Patient notably confused and easily distracted. Her sister in-law reports that her mood is all over the place. She also reports that she is hyper verbal and verbally abusive.  Patient sister in law reports that she feels that she needs to be admitted. Today she denies SI/HI/VAH.  She does present with paranoia, racing thoughts, fluctuations in mood, irritability. Patient sister in-law Patient quit her job at the [employer].  At this time patient unable to conducted a GAD 7 or a PHQ 9 due to her increase irritability and distractibility.   Patient agreeable to trial medications today to help manage her mood.  Provider instructed patient and her sister to come in to [facility] if symptoms worsen. Patient informed writer that in the past when she presented similarly she was not admitted to the hospital or [facility]. Provider informed patient and her sister in law that if she does not meet criteria for admission she would not be admitted. She then asked writer what else can be done. Provider informed patients sister in law that if she feels that Ms. Chloe [name] is a danger to herself she can position the [place] office for an IVC. She endorsed understanding and agreed.  Today patient agreeable to starting Fanapt. She was given a 4 day titration package of Fanapt (1 mg twice daily on day one, 2 mg twice daily on day two, 4 mg twice daily on day three, and 6 mg twice daily on day four). She was also given another week supply of Fanapt 6 mg to be taken twice daily. She will continue Lamictal as prescribed. At this time Lexapro discontinued. Potential side effects of medication and risks vs benefits of treatment vs non-treatment were explained and discussed. All questions were answered. Patient resides in [place] and will follow up at [facility]. No other concerns noted at this time.  Visit Diagnosis:    ICD-10-CM   1. Bipolar 1 disorder (HCC)  F31.9 lamoTRIgine (LAMICTAL) 200 MG tablet    Iloperidone (FANAPT) 6 MG TABS    Iloperidone (FANAPT) 6 MG TABS      Past Psychiatric History: bipolar affective disorder and tobacco dependence, several psychiatric hospitalizations in the past. She was last admitted 25 November 2017 and was noted to have psychotic symptoms along  with manic features during that hospital stay. She was discharged on risperidone, carbamazepine and trazodone at that time.  Past Medical History:  Past Medical History:  Diagnosis Date   Anxiety    Bipolar disorder (HCC)    Breast mass, right    GERD (gastroesophageal reflux disease)    Hyperlipidemia    Post-operative nausea and vomiting    Smoker     Past Surgical History:  Procedure Laterality Date   ABDOMINAL HYSTERECTOMY     had nausea and vomiting post-op   BREAST LUMPECTOMY WITH RADIOACTIVE SEED LOCALIZATION Right 11/12/2016   Procedure: RIGHT BREAST LUMPECTOMY WITH RADIOACTIVE SEED LOCALIZATION;  Surgeon: Harriette Bouillon, MD;  Location: [facility];  Service: General;  Laterality: Right;   CHOLECYSTECTOMY     LAPAROSCOPIC LYSIS OF ADHESIONS     MYOMECTOMY     PLANTAR FASCIA SURGERY Left     Family Psychiatric History: Unknown  Family History:  Family History  Problem Relation Age of Onset   Lung cancer Mother    Breast cancer Maternal Aunt    Hyperlipidemia Brother    Colon cancer Neg Hx    Esophageal cancer Neg Hx    Stomach cancer Neg Hx     Social History:  Social History   Socioeconomic History   Marital status: Divorced    Spouse name: Not on file   Number of children: Not on file   Years of education: Not on file   Highest education level: Associate degree: occupational, Scientist, product/process development, or vocational program  Occupational History   Not on file  Tobacco Use   Smoking status: Every Day    Current packs/day: 0.50    Types: Cigarettes   Smokeless tobacco: Never  Vaping Use   Vaping status: Never Used  Substance and Sexual Activity   Alcohol use: Not Currently    Comment: social   Drug use: No   Sexual activity: Not Currently  Other Topics Concern   Not on file  Social History Narrative   Not on file   Social Determinants of Health   Financial Resource Strain: High Risk (03/26/2022)   Overall Financial Resource Strain (CARDIA)     Difficulty of Paying Living Expenses: Hard  Food Insecurity: Food Insecurity Present (07/01/2022)   Hunger Vital Sign    Worried About Running Out of Food in the Last Year: Often true    Ran Out of Food in the Last Year: Often true  Transportation Needs: Unmet Transportation Needs (07/01/2022)   PRAPARE - Transportation    Lack of Transportation (Medical): No    Lack of Transportation (Non-Medical): Yes  Physical Activity: Inactive (03/26/2022)   Exercise Vital Sign    Days of Exercise per Week: 0 days    Minutes of Exercise per Session: 0 min  Stress: Stress Concern Present (03/26/2022)   [employer] of Occupational Health - Occupational Stress Questionnaire    Feeling of Stress : Very much  Social Connections: Socially Isolated (03/26/2022)   Social Connection and Isolation Panel [NHANES]    Frequency of Communication with Friends and  Family: Twice a week    Frequency of Social Gatherings with Friends and Family: Once a week    Attends Religious Services: Never    Database administrator or Organizations: No    Attends Banker Meetings: Never    Marital Status: Divorced    Allergies:  Allergies  Allergen Reactions   Penicillins Hives    Has patient had a PCN reaction causing immediate rash, facial/tongue/throat swelling, SOB or lightheadedness with hypotension: Yes Has patient had a PCN reaction causing severe rash involving mucus membranes or skin necrosis: No Has patient had a PCN reaction that required hospitalization: No Has patient had a PCN reaction occurring within the last 10 years: Yes If all of the above answers are "NO", then may proceed with Cephalosporin use.   Demerol [Meperidine]     Metabolic Disorder Labs: Lab Results  Component Value Date   HGBA1C 5.4 07/01/2022   MPG 102.54 11/30/2017   Lab Results  Component Value Date   PROLACTIN 12.4 11/30/2017   Lab Results  Component Value Date   CHOL 151 04/04/2022   TRIG 118 04/04/2022   HDL 44  04/04/2022   CHOLHDL 3.4 04/04/2022   VLDL 18 11/30/2017   LDLCALC 86 04/04/2022   LDLCALC 135 (H) 08/30/2021   Lab Results  Component Value Date   TSH 0.840 07/01/2022   TSH 1.290 07/19/2020    Therapeutic Level Labs: No results found for: "LITHIUM" No results found for: "VALPROATE" Lab Results  Component Value Date   CBMZ 8.3 11/30/2017    Current Medications: Current Outpatient Medications  Medication Sig Dispense Refill   Iloperidone (FANAPT) 6 MG TABS Take 1 tablet (6 mg total) by mouth 2 (two) times daily. 60 tablet 3   albuterol (PROVENTIL HFA) 108 (90 Base) MCG/ACT inhaler Inhale 2 puffs into the lungs every 4 (four) hours as needed for wheezing or shortness of breath. (Patient not taking: Reported on 09/03/2022) 6.7 g 1   cetirizine (ZYRTEC ALLERGY) 10 MG tablet Take 1 tablet (10 mg total) by mouth daily as needed for allergies. 100 tablet 1   fluticasone-salmeterol (ADVAIR) 100-50 MCG/ACT AEPB Inhale 1 puff into the lungs 2 (two) times daily. (Patient not taking: Reported on 09/03/2022) 60 each 3   Iloperidone (FANAPT) 1 & 2 & 4 & 6 MG MISC Day 1: Take 1 mg tablet, 1 in the morning and 1 in the evening, Day 2: Take 2 mg tablet, one in the morning and one in the evening, Day 3: Take 4 mg tablet, one in the morning and one in the evening, Day 4: Take 6 mg, one in the morning and then one in the evening.     Iloperidone (FANAPT) 6 MG TABS Take 1 tablet (6 mg total) by mouth 2 (two) times daily.     lamoTRIgine (LAMICTAL) 200 MG tablet Take 1 tablet (200 mg total) by mouth 2 (two) times daily. 60 tablet 3   omeprazole (PRILOSEC) 20 MG capsule Take 1 capsule (20 mg total) by mouth daily. 30 capsule 2   pravastatin (PRAVACHOL) 40 MG tablet Take 1 tablet (40 mg total) by mouth daily. 90 tablet 1   No current facility-administered medications for this visit.     Musculoskeletal: Strength & Muscle Tone: within normal limits and telehealth visit ,  Gait & Station: normal,  telehealth visit ,  Patient leans: N/A  Psychiatric Specialty Exam: Review of Systems  There were no vitals taken for this visit.There is no  height or weight on file to calculate BMI.  General Appearance: Well Groomed  Eye Contact:  Good  Speech:  Clear and Coherent and Pressured  Volume:  Increased  Mood:  Angry and Irritable  Affect:  Appropriate and Congruent  Thought Process:  Disorganized and Irrelevant  Orientation:  Full (Time, Place, and Person)  Thought Content: Paranoid Ideation and Rumination   Suicidal Thoughts:  No  Homicidal Thoughts:  No  Memory:  Immediate;   Fair Recent;   Fair Remote;   Fair  Judgement:  Fair  Insight:  Good  Psychomotor Activity:  Normal  Concentration:  Concentration: Good and Attention Span: Good  Recall:  Good  Fund of Knowledge: Good  Language: Good  Akathisia:  No  Handed:  Right  AIMS (if indicated): not done  Assets:  Communication Skills Desire for Improvement Housing Leisure Time Physical Health Social Support  ADL's:  Intact  Cognition: WNL  Sleep:  Good   Screenings: AUDIT    Flowsheet Row [facility]  Alcohol Use Disorder Identification Test Final Score (AUDIT) 0      GAD-7    Flowsheet Row Office Visit from 07/01/2022 in [facility] [employer] [facility] Office Visit from 04[facility]04/2022 in [facility] Office Visit from 03/26/2022 in [facility] Video Visit from 03/06/2022 in [facility] Video Visit from 10[facility]25/2021 in [facility]  Total GAD-7 Score 19 20 21 18 13       PHQ2-9    Flowsheet Row [facility] Visit [facility] 07/01/20[facility] Office Visit from 04/04/2022 in [facility] Office Visit from 03/26/2022 in [facility] Video Visit from 03/06/2022 in [facility] Video Visit from 10/25/2021 in [facility]  PHQ-2 Total Score 4 2 2 5 4   PHQ-9 Total Score 21 16 2 21 10       Flowsheet Row Office Visit from 03/26/2022 in [facility] Video Visit from 03/06/2022 in [facility] OP Visit from [facility]  C-SSRS RISK CATEGORY No Risk Error: Q7 should not be populated when Q6 is No Low Risk        Assessment and Plan: Patient presents with symptoms of hypomania, paranoia, anxiety and depression.  Provider instructed patient and her sister to come in to [facility] if symptoms worsen. Patient informed writer that in the past when she presented similarly she was not admitted to the hospital. Provider informed patient and her sister in law that if she does not meet criteria for admission she would not be admitted. She then asked writer what else can be done. Provider informed patients sister in law that if she feels that Ms. Chloe [name] is a danger to herself she can position the [place] office for an IVC. She endorsed understanding and agreed.  Today patient agreeable to starting Fanapt. She was given a 4 day titration package of Fanapt (1 mg twice daily on day one, 2 mg twice daily on day two, 4 mg twice daily on day three, and 6 mg twice daily on day four). She was also given another week supply of Fanapt 6 mg to be taken twice daily. She will continue Lamictal as prescribed. At this time  Lexapro discontinued. Patient resides in [place] and will follow up at [facility].  1. Bipolar 1 disorder (HCC)  Continue- lamoTRIgine (LAMICTAL) 200 MG tablet; Take 1 tablet (200 mg total) by mouth 2 (two) times daily.  Dispense: 60 tablet; Refill: 3 Start- Iloperidone (FANAPT) 6 MG TABS; Take 1 tablet (6 mg total) by mouth 2 (two) times daily.   Dispense: 60 tablet; Refill: 3 Start- Iloperidone (FANAPT) 6 MG TABS; Take 1 tablet (6 mg total) by mouth 2 (two) times daily.      Follow-up in 52-month Follow-up with therapy  Shanna Cisco, NP 09/06/2022, 3:28 PM

## 2022-09-06 NOTE — Telephone Encounter (Signed)
Thanks you for this update.

## 2022-09-09 ENCOUNTER — Telehealth: Payer: Self-pay

## 2022-09-09 ENCOUNTER — Other Ambulatory Visit: Payer: Self-pay

## 2022-09-09 NOTE — Telephone Encounter (Signed)
I spoke to this patient today via phone regarding a message that had been left for me that she had questions regarding the cost of her medications. I do not believe we were understanding each other throughout the conversation and the patient ultimately ended the call. My understanding is that the patient was wanting to know what she owed [facility] and the pharmacy. I advised patient that she currently does not have an outstanding balance with the pharmacy, her copays are $4 or $10 based on cost, she is enrolled in the pharmacy DOH program that allows for $0 copay on available meds, and Fanapt is not available at pharmacy for discounted pricing-PAP only. Apparently, this is not the information that the patient was requesting and could not verbalize what exactly she was needed/I was not understanding her question.  I did attempt to get information and to advise that Fanapt would have to be obtained from the [facility] but patient ended the call. Patient will have to complete an application to receive Fanapt therapy.

## 2022-09-13 ENCOUNTER — Other Ambulatory Visit: Payer: Self-pay

## 2022-09-16 NOTE — Telephone Encounter (Signed)
Thank you for this update 

## 2022-09-17 NOTE — Telephone Encounter (Signed)
Writer called 3 times without success.  Voicemail left for patient or Mrs. Percell to call clinic for assistance if needed.

## 2022-09-18 ENCOUNTER — Other Ambulatory Visit: Payer: Self-pay

## 2022-09-18 ENCOUNTER — Telehealth (HOSPITAL_COMMUNITY): Payer: Self-pay | Admitting: Psychiatry

## 2022-09-18 ENCOUNTER — Other Ambulatory Visit (HOSPITAL_COMMUNITY): Payer: Self-pay | Admitting: Psychiatry

## 2022-09-18 DIAGNOSIS — F319 Bipolar disorder, unspecified: Secondary | ICD-10-CM

## 2022-09-18 MED ORDER — FANAPT 6 MG PO TABS
6.0000 mg | ORAL_TABLET | Freq: Two times a day (BID) | ORAL | 3 refills | Status: DC
Start: 2022-09-18 — End: 2022-10-11
  Filled 2022-09-18: qty 60, 30d supply, fill #0

## 2022-09-18 NOTE — Telephone Encounter (Signed)
Provider has informed nursing staff to prepare sample for patient.

## 2022-09-18 NOTE — Telephone Encounter (Signed)
Patient informed Clinical research associate that she likes Fanapt however notes that it causes her to have diarrhea.  Provider asked patient if lower doses caused diarrhea and she notes that it did.  Provider asked patient if she was eating food that may induce diarrhea.  She informed Clinical research associate that she drinks coffee and tea regularly.  Provider recommended patient reducing her coffee/tea intake.  She endorsed understanding and agreed.  She asked writer if she could take an antidiarrheal medication.  Provider recommended over-the-counter Imodium.  She informed Clinical research associate that she is less irritable, distractible, and notes that her moods do not fluctuate as much.  Patient was seen with her sister-in-law who notes that she is less irritable and more calm.  Patient informed Clinical research associate that she is unsure if she can afford the medication.  Provider spoke to staff at [facility] who notes that they will call her and assist her with applying for patient care assistance.  Provider informed patient that a sample could be given if she will run out prior to her patient care assistance approval.  She endorsed understanding and agreed.  No other concerns at this time.

## 2022-09-27 ENCOUNTER — Emergency Department (HOSPITAL_COMMUNITY)
Admission: EM | Admit: 2022-09-27 | Discharge: 2022-09-28 | Disposition: A | Discharge status: Other Acute Inpatient Hospital | Payer: Medicaid Other | Attending: Emergency Medicine | Admitting: Emergency Medicine

## 2022-09-27 ENCOUNTER — Emergency Department (HOSPITAL_COMMUNITY): Payer: Medicaid Other

## 2022-09-27 ENCOUNTER — Encounter (HOSPITAL_COMMUNITY): Payer: Self-pay | Admitting: *Deleted

## 2022-09-27 ENCOUNTER — Other Ambulatory Visit: Payer: Self-pay

## 2022-09-27 DIAGNOSIS — J449 Chronic obstructive pulmonary disease, unspecified: Secondary | ICD-10-CM | POA: Diagnosis not present

## 2022-09-27 DIAGNOSIS — F329 Major depressive disorder, single episode, unspecified: Secondary | ICD-10-CM | POA: Diagnosis not present

## 2022-09-27 DIAGNOSIS — F32A Depression, unspecified: Secondary | ICD-10-CM | POA: Insufficient documentation

## 2022-09-27 DIAGNOSIS — R45851 Suicidal ideations: Secondary | ICD-10-CM

## 2022-09-27 DIAGNOSIS — F29 Unspecified psychosis not due to a substance or known physiological condition: Secondary | ICD-10-CM | POA: Diagnosis not present

## 2022-09-27 DIAGNOSIS — Z7951 Long term (current) use of inhaled steroids: Secondary | ICD-10-CM | POA: Diagnosis not present

## 2022-09-27 DIAGNOSIS — Z79899 Other long term (current) drug therapy: Secondary | ICD-10-CM | POA: Diagnosis not present

## 2022-09-27 DIAGNOSIS — S60042A Contusion of left ring finger without damage to nail, initial encounter: Secondary | ICD-10-CM | POA: Insufficient documentation

## 2022-09-27 DIAGNOSIS — S6992XA Unspecified injury of left wrist, hand and finger(s), initial encounter: Secondary | ICD-10-CM | POA: Diagnosis present

## 2022-09-27 LAB — URINALYSIS, ROUTINE W REFLEX MICROSCOPIC
Bacteria, UA: NONE SEEN
Bilirubin Urine: NEGATIVE
Glucose, UA: NEGATIVE mg/dL
Ketones, ur: NEGATIVE mg/dL
Leukocytes,Ua: NEGATIVE
Nitrite: NEGATIVE
Protein, ur: NEGATIVE mg/dL
Specific Gravity, Urine: 1.008 (ref 1.005–1.030)
pH: 5 (ref 5.0–8.0)

## 2022-09-27 LAB — RAPID URINE DRUG SCREEN, HOSP PERFORMED
Amphetamines: NOT DETECTED
Barbiturates: NOT DETECTED
Benzodiazepines: NOT DETECTED
Cocaine: NOT DETECTED
Opiates: NOT DETECTED
Tetrahydrocannabinol: NOT DETECTED

## 2022-09-27 LAB — BASIC METABOLIC PANEL
Anion gap: 7 (ref 5–15)
BUN: 11 mg/dL (ref 6–20)
CO2: 27 mmol/L (ref 22–32)
Calcium: 9.1 mg/dL (ref 8.9–10.3)
Chloride: 104 mmol/L (ref 98–111)
Creatinine, Ser: 0.49 mg/dL (ref 0.44–1.00)
GFR, Estimated: >60 mL/min (ref >60)
Glucose, Bld: 85 mg/dL (ref 70–99)
Potassium: 3.8 mmol/L (ref 3.5–5.1)
Sodium: 138 mmol/L (ref 135–145)

## 2022-09-27 LAB — CBC
HCT: 43.2 % (ref 36.0–46.0)
Hemoglobin: 14.6 g/dL (ref 12.0–15.0)
MCH: 31.1 pg (ref 26.0–34.0)
MCHC: 33.8 g/dL (ref 30.0–36.0)
MCV: 91.9 fL (ref 80.0–100.0)
Platelets: 366 10*3/uL (ref 150–400)
RBC: 4.7 MIL/uL (ref 3.87–5.11)
RDW: 12.6 % (ref 11.5–15.5)
WBC: 10.6 10*3/uL — ABNORMAL HIGH (ref 4.0–10.5)
nRBC: 0 % (ref 0.0–0.2)

## 2022-09-27 LAB — ACETAMINOPHEN LEVEL: Acetaminophen (Tylenol), Serum: <10 ug/mL — ABNORMAL LOW (ref 10–30)

## 2022-09-27 LAB — ETHANOL: Alcohol, Ethyl (B): <10 mg/dL (ref <10)

## 2022-09-27 LAB — SALICYLATE LEVEL: Salicylate Lvl: <7 mg/dL — ABNORMAL LOW (ref 7.0–30.0)

## 2022-09-27 MED ORDER — MOMETASONE FURO-FORMOTEROL FUM 100-5 MCG/ACT IN AERO
2.0000 | INHALATION_SPRAY | Freq: Two times a day (BID) | RESPIRATORY_TRACT | Status: DC
  Administered 2022-09-28: 2 via RESPIRATORY_TRACT
  Filled 2022-09-27: qty 8.8

## 2022-09-27 MED ORDER — ACETAMINOPHEN 325 MG PO TABS: 650.0000 mg | ORAL_TABLET | ORAL | Status: DC | PRN

## 2022-09-27 MED ORDER — PANTOPRAZOLE SODIUM 40 MG PO TBEC
40.0000 mg | DELAYED_RELEASE_TABLET | Freq: Every day | ORAL | Status: DC
  Administered 2022-09-28: 40 mg via ORAL
  Filled 2022-09-27: qty 1

## 2022-09-27 MED ORDER — PRAVASTATIN SODIUM 40 MG PO TABS
40.0000 mg | ORAL_TABLET | Freq: Every day | ORAL | Status: DC
  Administered 2022-09-28: 40 mg via ORAL
  Filled 2022-09-27: qty 1

## 2022-09-27 MED ORDER — ALBUTEROL SULFATE HFA 108 (90 BASE) MCG/ACT IN AERS: 2.0000 | INHALATION_SPRAY | RESPIRATORY_TRACT | Status: DC | PRN

## 2022-09-27 MED ORDER — ILOPERIDONE 6 MG PO TABS
6.0000 mg | ORAL_TABLET | Freq: Two times a day (BID) | ORAL | Status: DC
  Administered 2022-09-28: 6 mg via ORAL

## 2022-09-27 MED ORDER — LAMOTRIGINE 25 MG PO TABS
200.0000 mg | ORAL_TABLET | Freq: Two times a day (BID) | ORAL | Status: DC
  Administered 2022-09-27 – 2022-09-28 (×2): 200 mg via ORAL
  Filled 2022-09-27 (×2): qty 8

## 2022-09-27 MED ORDER — ONDANSETRON HCL 4 MG PO TABS: 4.0000 mg | ORAL_TABLET | Freq: Three times a day (TID) | ORAL | Status: DC | PRN

## 2022-09-27 NOTE — ED Notes (Signed)
Pt given a snack and something to drink

## 2022-09-27 NOTE — Consult Note (Signed)
[facility] Consult Note  Patient Name: Chloe [name] MRN: [id number] DOB: 09/21/1965 DATE OF Consult: 09/27/2022   TELEPSYCHIATRY ATTESTATION & CONSENT  As the provider for this telehealth consult, I attest that I verified the patient's identity using two separate identifiers, introduced myself to the patient, provided my credentials, disclosed my location, and performed this encounter via a HIPAA-compliant, real-time, face-to-face, two-way, interactive audio and video platform and with the full consent and agreement of the patient (or guardian as applicable.)  Patient physical location:  . [place] [facility]   Bed: [facility]  Acuity:  Emergent   Telehealth provider physical location: home office in state of [place]  Video scheduled start time: 0900 (Central Time) Video end time: 0940 (Central Time)   PRIMARY PSYCHIATRIC DIAGNOSES (ICD-10 format preferred)  Depressive disorder unspecified  Suicidal ideation  Hx of SA in the past . Hx of past psych hospitalizations Guarded ; not forthcoming ; minimization; poor insight  RECOMMENDATIONS  Medication recommendations: resume outpatient meds.  Non-Medication/therapeutic recommendations: rn observation in ED .  Is inpatient psychiatric hospitalization recommended for this patient?:            [x]  YES (Explain why): patient is not forthcoming with what has occurred tangential circumstantial ; unclear on details of her SI and injury to hand.             As a historian not reliable      From a psychiatric perspective, is this patient appropriate for discharge to an outpatient setting/resource or other less restrictive environment for continued care?:       [x]  NO (Explain why not): she has had outpatient care and per sister not taking her medications.  Follow-Up Telepsychiatry C/L services:                 [x]  We will sign off for now. Please re-consult our service if needed for                        any concerning changes in the patient's condition, discharge planning, or                     questions.         []  We will continue to follow this patient with you until stabilized or discharged. If                    you have any questions or concerns, please call our TeleCare Coordination                     service at [phone] and ask for myself or the provider on-call.  Communication: Treatment team members (and family members if applicable) who were involved in treatment/care discussions and planning, and with whom we spoke or engaged with via secure text/chat, include the following: Burgess Amor  Thank you for involving [place] in the care of this patient.      CHIEF COMPLAINT/REASON FOR CONSULT  SI reported  HISTORY OF PRESENT ILLNESS (HPI)  The patient 57 yo with IVC by sister in law Lives with brother and sister in law Patient has hx of bipolar diosrder/anxiety/derpession Argument with sister noted with issues of people  Purported SI  Left rings finger swollen.  Denying SI and HI in the ED ====================================   Patient living with brother and sister in law Problems at home  Disagreements at home  Housing issues of being booted out or leaving the house potentially Issues with working When asked about what lead to her IVC She claims argument with sister Patient dances around if she stated suicide She claims no SA recently  But yes in the past Yes psych hospital in the past She denies any current SI no plan no intention in killing herself Or harming others Patient upset and often times tangential and blaming others for her misery In general minimizing and evasive at times However no severe agitation now No aggression  No psychosis No mania She has a psychiatrist or provide that see her for her meds but per sister is not taking them At this time patient is not reliable ; she is unclear on details of her own feeling and thoughts --blaming others for her  misery Resigned to coming into the psych hospital once again     PAST PSYCHIATRIC HISTORY  Yes past psych hospital Yes past SA when younger Seeing outpt psychiatry now  Otherwise as per HPI above.  PAST MEDICAL HISTORY  Past Medical History:  Diagnosis Date   Anxiety    Bipolar disorder (HCC)    Breast mass, right    GERD (gastroesophageal reflux disease)    Hyperlipidemia    Post-operative nausea and vomiting    Smoker       HOME MEDICATIONS  (Not in a hospital admission)       ALLERGIES  Allergies  Allergen Reactions   Penicillins Hives    Has patient had a PCN reaction causing immediate rash, facial/tongue/throat swelling, SOB or lightheadedness with hypotension: Yes Has patient had a PCN reaction causing severe rash involving mucus membranes or skin necrosis: No Has patient had a PCN reaction that required hospitalization: No Has patient had a PCN reaction occurring within the last 10 years: Yes If all of the above answers are "NO", then may proceed with Cephalosporin use.   Demerol [Meperidine]     SOCIAL & SUBSTANCE USE HISTORY  Social History   Socioeconomic History   Marital status: Divorced    Spouse name: Not on file   Number of children: Not on file   Years of education: Not on file   Highest education level: Associate degree: occupational, Scientist, product/process development, or vocational program  Occupational History   Not on file  Tobacco Use   Smoking status: Every Day    Current packs/day: 0.50    Types: Cigarettes   Smokeless tobacco: Never  Vaping Use   Vaping status: Never Used  Substance and Sexual Activity   Alcohol use: Not Currently    Comment: social   Drug use: No   Sexual activity: Not Currently  Other Topics Concern   Not on file  Social History Narrative   Not on file   Social Determinants of Health   Financial Resource Strain: High Risk (03/26/2022)   Overall Financial Resource Strain (CARDIA)    Difficulty of Paying Living Expenses: Hard  Food  Insecurity: Food Insecurity Present (07/01/2022)   Hunger Vital Sign    Worried About Running Out of Food in the Last Year: Often true    Ran Out of Food in the Last Year: Often true  Transportation Needs: Unmet Transportation Needs (07/01/2022)   PRAPARE - Transportation    Lack of Transportation (Medical): No    Lack of Transportation (Non-Medical): Yes  Physical Activity: Inactive (03/26/2022)   Exercise Vital Sign    Days of Exercise per Week: 0 days    Minutes of  Exercise per Session: 0 min  Stress: Stress Concern Present (03/26/2022)   [employer] of Occupational Health - Occupational Stress Questionnaire    Feeling of Stress : Very much  Social Connections: Socially Isolated (03/26/2022)   Social Connection and Isolation Panel [NHANES]    Frequency of Communication with Friends and Family: Twice a week    Frequency of Social Gatherings with Friends and Family: Once a week    Attends Religious Services: Never    Database administrator or Organizations: No    Attends Engineer, structural: Never    Marital Status: Divorced    CANNABIS USE     Family History  Problem Relation Age of Onset   Lung cancer Mother    Breast cancer Maternal Aunt    Hyperlipidemia Brother    Colon cancer Neg Hx    Esophageal cancer Neg Hx    Stomach cancer Neg Hx    Family Psychiatric History (if known):      MENTAL STATUS EXAM (MSE)  Appearance:  [x]  Appropriately dressed for the context and circumstances      []  Well-groomed       []  Casually dressed  []  Hospital gown       []  Unkempt   Attitude:  []  Cooperative      [x]  Guarded       []  Aggressive      []  Demanding       []  Suspicious       []  Withdrawn  []  Attention-seeking      []  Sullen  Behavior:   [x]  Normal motor activity and eye contact   []  Psychomotor slowing       []  Poor eye contact         []  Tearful []  Restless       []  Fidgeting        []  Psychomotor agitation  Impulse Control  []  Good     [x]  Fair     []   Poor  Speech: [x]  Within normal limits for the context and circumstances  []  Slow       []  Soft       []  Monotone      []  Little spontaneous speech    []  Incoherent     []  Slurred    []  Excessive       []  Rapid        [] Loud       []  Pressured       []  Dramatic      []  Accent noted  Mood:  []  Euthymic      [x]  Dysthymic     []  Depressed        [x]  Anxious        []  Angry/Irritable   []  Expansive     []  Euphoric/Elated      Affect:  []  Congruent and appropriate to content of speech and circumstances  []  Full range    []  Constricted    []  Flat    []  Blunted    []  Exaggerated     []  Labile     []  Inappropriate:  Thought process:  []  Linear, logical and goal directed    []  Disorganized    [x]  Circumstantial    []  Circumferential    []  Flight of ideas  [x]  Tangential   []  Thought blocking   []  Loose associations  Thought content:  [x]  Denies hallucinations, delusions, or paranoia    []  Hallucinations []  Delusions      []   Paranoia        []  Ideas of reference      []  Obsessions        []  ELOC  Suicidality and Homicidality:  [x]  Denies any active or passive suicidal or homicidal ideation   []  Passive suicidal ideation []  Active suicidal ideation     []  Passive homicidal ideation      []  Active homicidal ideation      []  As per HPI  Insight:   []  Good      [x]  Fair      [x]  Poor  Judgement:    []  Good      [x]  Fair      [x]  Poor  Basic Memory:    []  Good       [x]  Fair      []  Poor  Basic Attention/Concentration:    []  Good      [x]  Fair      []  Poor  Orientation:      [x]  Oriented to person, place, and time        []  Disoriented   VITALS (IF TAKEN)   @VSR @   BP 98/63 (BP Location: Right Arm)   Pulse 62   Temp 98 F (36.7 C) (Oral)   Resp 20   Ht 5\' 1"  (1.549 m)   Wt 62 kg   SpO2 96%   BMI 25.83 kg/m    LABS (IF COLLECTED)  Admission on 09/27/2022  Component Date Value Ref Range Status   Opiates 09/27/2022 NONE DETECTED  NONE DETECTED Final   Cocaine 09/27/2022 NONE  DETECTED  NONE DETECTED Final   Benzodiazepines 09/27/2022 NONE DETECTED  NONE DETECTED Final   Amphetamines 09/27/2022 NONE DETECTED  NONE DETECTED Final   Tetrahydrocannabinol 09/27/2022 NONE DETECTED  NONE DETECTED Final   Barbiturates 09/27/2022 NONE DETECTED  NONE DETECTED Final   Comment: (NOTE) DRUG SCREEN FOR MEDICAL PURPOSES ONLY.  IF CONFIRMATION IS NEEDED FOR ANY PURPOSE, NOTIFY LAB WITHIN 5 DAYS.  LOWEST DETECTABLE LIMITS FOR URINE DRUG SCREEN Drug Class                     Cutoff (ng/mL) Amphetamine and metabolites    1000 Barbiturate and metabolites    200 Benzodiazepine                 200 Opiates and metabolites        300 Cocaine and metabolites        300 THC                            50 Performed at [facility], [address], [place], [place] [address]    WBC 09/27/2022 10.6 (H)  4.0 - 10.5 K/uL Final   RBC 09/27/2022 4.70  3.87 - 5.11 MIL/uL Final   Hemoglobin 09/27/2022 14.6  12.0 - 15.0 g/dL Final   HCT 60/45/4098 43.2  36.0 - 46.0 % Final   MCV 09/27/2022 91.9  80.0 - 100.0 fL Final   MCH 09/27/2022 31.1  26.0 - 34.0 pg Final   MCHC 09/27/2022 33.8  30.0 - 36.0 g/dL Final   RDW 11/91/4782 12.6  11.5 - 15.5 % Final   Platelets 09/27/2022 366  150 - 400 K/uL Final   nRBC 09/27/2022 0.0  0.0 - 0.2 % Final   Performed at [facility], [address] [place], [place] [address]   Sodium 09/27/2022 138  135 -  145 mmol/L Final   Potassium 09/27/2022 3.8  3.5 - 5.1 mmol/L Final   Chloride 09/27/2022 104  98 - 111 mmol/L Final   CO2 09/27/2022 27  22 - 32 mmol/L Final   Glucose, Bld 09/27/2022 85  70 - 99 mg/dL Final   Glucose reference range applies only to samples taken after fasting for at least 8 hours.   BUN 09/27/2022 11  6 - 20 mg/dL Final   Creatinine, Ser 09/27/2022 0.49  0.44 - 1.00 mg/dL Final   Calcium 28/41/3244 9.1  8.9 - 10.3 mg/dL Final   GFR, Estimated 09/27/2022 >60  >60 mL/min Final   Comment: (NOTE) Calculated using the CKD-EPI  Creatinine Equation (2021)    Anion gap 09/27/2022 7  5 - 15 Final   Performed at [facility], [address] [place], [place] [address]   Alcohol, Ethyl (B) 09/27/2022 <10  <10 mg/dL Final   Comment: (NOTE) Lowest detectable limit for serum alcohol is 10 mg/dL.  For medical purposes only. Performed at [facility], [address] [place], [place] [address]    Salicylate Lvl 09/27/2022 <7.0 (L)  7.0 - 30.0 mg/dL Final   Performed at [facility], [address] [place], [place] [address]   Acetaminophen (Tylenol), Serum 09/27/2022 <10 (L)  10 - 30 ug/mL Final   Comment: (NOTE) Therapeutic concentrations vary significantly. A range of 10-30 ug/mL  may be an effective concentration for many patients. However, some  are best treated at concentrations outside of this range. Acetaminophen concentrations >150 ug/mL at 4 hours after ingestion  and >50 ug/mL at 12 hours after ingestion are often associated with  toxic reactions.  Performed at [facility], [address] [place], [place] [address]    Color, Urine 09/27/2022 YELLOW  YELLOW Final   APPearance 09/27/2022 CLEAR  CLEAR Final   Specific Gravity, Urine 09/27/2022 1.008  1.005 - 1.030 Final   pH 09/27/2022 5.0  5.0 - 8.0 Final   Glucose, UA 09/27/2022 NEGATIVE  NEGATIVE mg/dL Final   Hgb urine dipstick 09/27/2022 SMALL (A)  NEGATIVE Final   Bilirubin Urine 09/27/2022 NEGATIVE  NEGATIVE Final   Ketones, ur 09/27/2022 NEGATIVE  NEGATIVE mg/dL Final   Protein, ur 56/38/7564 NEGATIVE  NEGATIVE mg/dL Final   Nitrite 33/29/5188 NEGATIVE  NEGATIVE Final   Leukocytes,Ua 09/27/2022 NEGATIVE  NEGATIVE Final   RBC / HPF 09/27/2022 0-5  0 - 5 RBC/hpf Final   WBC, UA 09/27/2022 0-5  0 - 5 WBC/hpf Final   Bacteria, UA 09/27/2022 NONE SEEN  NONE SEEN Final   Squamous Epithelial / HPF 09/27/2022 0-5  0 - 5 /HPF Final   Mucus 09/27/2022 PRESENT   Final   Hyaline Casts, UA 09/27/2022 PRESENT   Final   Performed at [facility], [address], [place], [place] [address]    ROS & ADDITIONAL FINDINGS  ROS: Notable for the following relevant positive findings: Psychiatric: no mania no psychosis Other notable positive ROS findings: as per hpi  Additional findings:      Musculoskeletal:    [x]  No Abnormal Movements Observed        []  Impaired      Gait & Station:        [x]  Normal        []  Wheelchair/Walker          []  Laying/Sitting       Pain Screening:   [x]  Denies    []  Present--mild to moderate     []   Present--severe (will                             consider referral for ongoing evaluation and treatment)      Nutrition & Dental Concerns:  no gross  eating disorder   RISK ASSESSMENT*  Is the patient experiencing any suicidal or homicidal ideations:     [x] YES        []  NO       Explain if yes: as per IVC SI verbalization ; Protective factors considered for safety management: alert verbal  Risk factors/concerns considered for safety management: (check all that apply) [x]  Prior attempt                                      []  Hopelessness       []  Family history of suicide                    []  Impulsivity [x]  Depression                                         []  Aggression [x]  Substance abuse/dependence          []  Isolation []  Physical illness/chronic pain              []  Barriers to accessing treatment []  Recent loss                                        []  Unwillingness to seek help []  Access to lethal means                      []  Female gender []  Age over 66                                        [x]  Unmarried  Is there a safety management plan with the patient and treatment team to minimize risk factors and promote protective factors:     [x]  YES      []  NO            Explain: rn observation + proceed to inpatient psycch       Based on my current evaluation and risk assessment of the patient at the time of this encounter, this patient is considered to be at:   []    Low Risk                      [x]   Moderate Risk                      []   High Risk  *RISK ASSESSMENT Risk assessment is a dynamic process; it is possible that this patient's condition, and risk level, may change. This should be re-evaluated and managed over time as appropriate. Please re-consult psychiatric consult services if additional assistance is needed in terms of risk assessment and management. If your team decides to discharge this patient, please advise the patient how to best access emergency psychiatric services, or to call 911,  if their condition worsens or they feel unsafe in any way.   CW Antonietta Breach, M. D., [facility]

## 2022-09-27 NOTE — ED Notes (Signed)
Pt denies SI/HI at this time, states "I am just tired of the situation I am in, I am not suicidal" also endorses concerns about either an animal or person living at the house with her. States "I want to know if he will be okay and he will be there when I get back"

## 2022-09-27 NOTE — ED Provider Notes (Deleted)
Patient presenting with commitment papers which were filed by patient's sister, currently under the custody of [facility] department, secondary to suicidal ideation, although patient denies this currently.  She is refusing to give any further information, will not speak to me.  During her initial evaluation it is, to our attention that her commitment papers are filed under an incorrect name.  Sheriff present in conversation with the magistrate is planning to take the patient to the sisters home, refile papers with correct information and return with her once he has the correct paperwork.   Burgess Amor, PA-C 09/27/22 1629

## 2022-09-27 NOTE — ED Triage Notes (Signed)
Pt arrived with [facility] with IVC papers stating pt told her sister that she was going to kill herself.  Pt refuses to give details, but pt holds her left hand up and said she didn't do this to herself, left ring finger swollen and discolored.  Pt denies SI or HI, pt does admit to suicide attempt about 40 years ago.

## 2022-09-27 NOTE — BH Assessment (Signed)
Contacted [facility] to request tele-psychiatry evaluation. Jolyne Loa states Pt will be seen by Dr. Arther Dames at 2200. Created conversation with Dr Arther Dames, Dr Rolly Pancake, Burgess Amor, PA-C, Gilford Rile, RN and Jolyne Loa to coordinate evaluation.   Pamalee Leyden, [facility], [facility] Triage Specialist

## 2022-09-27 NOTE — ED Notes (Signed)
Pt given crackers and a drink
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
## 2022-09-27 NOTE — ED Notes (Signed)
Pt dressed out in appropriate scrubs, pt belongings placed in bag, which includes: 1 shirt, 2 pants, and shoes, pt kept her eyeglasses on her. Belongings locked in locker security called to wand pt

## 2022-09-28 ENCOUNTER — Inpatient Hospital Stay
Admission: AD | Admit: 2022-09-28 | Discharge: 2022-10-11 | DRG: 885 | Disposition: A | Discharge status: Home / Self Care | Payer: Medicaid Other | Source: Intra-hospital | Attending: Psychiatry | Admitting: Psychiatry

## 2022-09-28 DIAGNOSIS — Z79899 Other long term (current) drug therapy: Secondary | ICD-10-CM

## 2022-09-28 DIAGNOSIS — Z9889 Other specified postprocedural states: Secondary | ICD-10-CM | POA: Diagnosis not present

## 2022-09-28 DIAGNOSIS — F315 Bipolar disorder, current episode depressed, severe, with psychotic features: Secondary | ICD-10-CM | POA: Diagnosis present

## 2022-09-28 DIAGNOSIS — F410 Panic disorder [episodic paroxysmal anxiety] without agoraphobia: Secondary | ICD-10-CM | POA: Diagnosis present

## 2022-09-28 DIAGNOSIS — Z5900 Homelessness unspecified: Secondary | ICD-10-CM

## 2022-09-28 DIAGNOSIS — Z5941 Food insecurity: Secondary | ICD-10-CM

## 2022-09-28 DIAGNOSIS — R32 Unspecified urinary incontinence: Secondary | ICD-10-CM | POA: Diagnosis present

## 2022-09-28 DIAGNOSIS — Z9049 Acquired absence of other specified parts of digestive tract: Secondary | ICD-10-CM

## 2022-09-28 DIAGNOSIS — Z5986 Financial insecurity: Secondary | ICD-10-CM | POA: Diagnosis not present

## 2022-09-28 DIAGNOSIS — K219 Gastro-esophageal reflux disease without esophagitis: Secondary | ICD-10-CM | POA: Diagnosis present

## 2022-09-28 DIAGNOSIS — Z90710 Acquired absence of both cervix and uterus: Secondary | ICD-10-CM

## 2022-09-28 DIAGNOSIS — Z803 Family history of malignant neoplasm of breast: Secondary | ICD-10-CM | POA: Diagnosis not present

## 2022-09-28 DIAGNOSIS — Z83438 Family history of other disorder of lipoprotein metabolism and other lipidemia: Secondary | ICD-10-CM | POA: Diagnosis not present

## 2022-09-28 DIAGNOSIS — Z801 Family history of malignant neoplasm of trachea, bronchus and lung: Secondary | ICD-10-CM | POA: Diagnosis not present

## 2022-09-28 DIAGNOSIS — Z9151 Personal history of suicidal behavior: Secondary | ICD-10-CM

## 2022-09-28 DIAGNOSIS — E785 Hyperlipidemia, unspecified: Secondary | ICD-10-CM | POA: Diagnosis present

## 2022-09-28 DIAGNOSIS — S60042A Contusion of left ring finger without damage to nail, initial encounter: Secondary | ICD-10-CM | POA: Diagnosis not present

## 2022-09-28 DIAGNOSIS — F17210 Nicotine dependence, cigarettes, uncomplicated: Secondary | ICD-10-CM | POA: Diagnosis present

## 2022-09-28 DIAGNOSIS — Z885 Allergy status to narcotic agent status: Secondary | ICD-10-CM

## 2022-09-28 DIAGNOSIS — Z7951 Long term (current) use of inhaled steroids: Secondary | ICD-10-CM

## 2022-09-28 DIAGNOSIS — F339 Major depressive disorder, recurrent, unspecified: Secondary | ICD-10-CM | POA: Diagnosis present

## 2022-09-28 DIAGNOSIS — Z638 Other specified problems related to primary support group: Secondary | ICD-10-CM

## 2022-09-28 DIAGNOSIS — Z880 Allergy status to penicillin: Secondary | ICD-10-CM | POA: Diagnosis not present

## 2022-09-28 MED ORDER — ACETAMINOPHEN 325 MG PO TABS: 650.0000 mg | ORAL_TABLET | Freq: Four times a day (QID) | ORAL | Status: DC | PRN

## 2022-09-28 MED ORDER — ILOPERIDONE 6 MG PO TABS: 6.0000 mg | ORAL_TABLET | Freq: Two times a day (BID) | ORAL | Status: DC

## 2022-09-28 MED ORDER — PANTOPRAZOLE SODIUM 40 MG PO TBEC
40.0000 mg | DELAYED_RELEASE_TABLET | Freq: Every day | ORAL | Status: DC
  Administered 2022-09-29 – 2022-10-11 (×13): 40 mg via ORAL
  Filled 2022-09-28 (×13): qty 1

## 2022-09-28 MED ORDER — MOMETASONE FURO-FORMOTEROL FUM 100-5 MCG/ACT IN AERO
2.0000 | INHALATION_SPRAY | Freq: Two times a day (BID) | RESPIRATORY_TRACT | Status: DC
  Administered 2022-09-29 – 2022-10-10 (×21): 2 via RESPIRATORY_TRACT
  Filled 2022-09-28: qty 8.8

## 2022-09-28 MED ORDER — LORAZEPAM 2 MG/ML IJ SOLN: 2.0000 mg | Freq: Three times a day (TID) | INTRAMUSCULAR | Status: DC | PRN

## 2022-09-28 MED ORDER — PRAVASTATIN SODIUM 40 MG PO TABS
40.0000 mg | ORAL_TABLET | Freq: Every evening | ORAL | Status: DC
  Administered 2022-09-29 – 2022-10-10 (×12): 40 mg via ORAL
  Filled 2022-09-28 (×13): qty 1

## 2022-09-28 MED ORDER — ONDANSETRON HCL 4 MG PO TABS: 4.0000 mg | ORAL_TABLET | Freq: Three times a day (TID) | ORAL | Status: DC | PRN

## 2022-09-28 MED ORDER — ALUM & MAG HYDROXIDE-SIMETH 200-200-20 MG/5ML PO SUSP: 30.0000 mL | ORAL | Status: DC | PRN

## 2022-09-28 MED ORDER — HYDROXYZINE HCL 10 MG PO TABS
10.0000 mg | ORAL_TABLET | Freq: Three times a day (TID) | ORAL | Status: DC | PRN
  Administered 2022-10-02 – 2022-10-04 (×3): 10 mg via ORAL
  Filled 2022-09-28 (×3): qty 1

## 2022-09-28 MED ORDER — DIPHENHYDRAMINE HCL 50 MG/ML IJ SOLN: 50.0000 mg | Freq: Three times a day (TID) | INTRAMUSCULAR | Status: DC | PRN

## 2022-09-28 MED ORDER — ALBUTEROL SULFATE HFA 108 (90 BASE) MCG/ACT IN AERS: 2.0000 | INHALATION_SPRAY | RESPIRATORY_TRACT | Status: DC | PRN

## 2022-09-28 MED ORDER — LORAZEPAM 2 MG PO TABS
2.0000 mg | ORAL_TABLET | Freq: Three times a day (TID) | ORAL | Status: DC | PRN
  Administered 2022-10-05: 2 mg via ORAL
  Filled 2022-09-28: qty 1

## 2022-09-28 MED ORDER — DIPHENHYDRAMINE HCL 25 MG PO CAPS: 50.0000 mg | ORAL_CAPSULE | Freq: Three times a day (TID) | ORAL | Status: DC | PRN

## 2022-09-28 MED ORDER — ACETAMINOPHEN 325 MG PO TABS
650.0000 mg | ORAL_TABLET | ORAL | Status: DC | PRN
  Administered 2022-10-08 – 2022-10-10 (×3): 650 mg via ORAL
  Filled 2022-09-28 (×3): qty 2

## 2022-09-28 MED ORDER — LAMOTRIGINE 100 MG PO TABS
200.0000 mg | ORAL_TABLET | Freq: Two times a day (BID) | ORAL | Status: DC
  Administered 2022-09-29: 200 mg via ORAL
  Filled 2022-09-28: qty 2

## 2022-09-28 MED ORDER — MAGNESIUM HYDROXIDE 400 MG/5ML PO SUSP: 30.0000 mL | Freq: Every day | ORAL | Status: DC | PRN

## 2022-09-28 MED ORDER — HALOPERIDOL LACTATE 5 MG/ML IJ SOLN: 5.0000 mg | Freq: Three times a day (TID) | INTRAMUSCULAR | Status: DC | PRN

## 2022-09-28 MED ORDER — HALOPERIDOL 5 MG PO TABS: 5.0000 mg | ORAL_TABLET | Freq: Three times a day (TID) | ORAL | Status: DC | PRN

## 2022-09-28 NOTE — ED Notes (Signed)
Pt has been able to walk to bathroom, she took a shower this am, and she walked back to her room.

## 2022-09-28 NOTE — ED Notes (Signed)
Pt ambulatory, continent, and able to perform ADL's without assistance.
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
## 2022-09-28 NOTE — ED Notes (Signed)
RN from other set said pt needed to make a call. Gave pt phone to do that. Figured I could get the medication info that is needed at the same time since [facility] does not have her needed antipsychotic med. Apparently there was some sort of disagreement with pt and whomever she was on the phone with so I took the phone back and continued to call family in order to get information on her needed medication.   Spoke with Fayrene Fearing who was "irritated that we have been calling this am as he just got home from work.When asked about the depends that the pt claims that are at home and she needs them, he said, "Can't she get up and walk to the bathroom", to which I told him I am unaware of her particular situation down there, but as a female there are things that are better than others" Asked him about her psych med again and he said he doesn't handle that, "[name] does, and she just stormed out the door and is pissed because she hasn't slept well and we are calling and buggign her." I asked if [name] calms down or whatnot and is willing to call me back, that would be appreciated.

## 2022-09-28 NOTE — ED Notes (Signed)
Phone privileges revoked for the rest of the day. Patient was asking family member to bring her something and she kept hanging up on the family and then stopped answering completely but tried to keep the phone without using it. Phone was taken, patient cussing at staff and trying to close the curtain so staff cannot see her. Zip tied Curtain so she could not shut them. Called Tech "a fucking idiot".

## 2022-09-28 NOTE — ED Provider Notes (Signed)
Emergency Medicine Observation Re-evaluation Note  Chloe [name] is a 57 y.o. female, seen on rounds today.  Pt initially presented to the ED for complaints of V70.1 Currently, the patient is lying quietly in bed.  Physical Exam  BP 117/72 (BP Location: Right Arm)   Pulse 62   Temp 97.8 F (36.6 C)   Resp 20   Ht 5\' 1"  (1.549 m)   Wt 62 kg   SpO2 97%   BMI 25.83 kg/m  Physical Exam General: No acute distress Cardiac: Well-perfused Lungs: Nonlabored Psych: Calm  ED Course / MDM  EKG:   I have reviewed the labs performed to date as well as medications administered while in observation.  Recent changes in the last 24 hours include medical and psychiatric evaluation.  Plan  Current plan is for inpatient psychiatric placement.    Terrilee Files, MD 09/28/22 Windy Fast

## 2022-09-28 NOTE — Progress Notes (Signed)
The patient arrived to the BMU, accompanied by security and carrying signed IVC paperwork.  Upon arrival, the patient was provided the opportunity to care for her hygiene following an episode of urinary incontinence during transport.  A skin check revealed a small bruise on the patient's left knee and a swollen/bruised right ring finger.  Belongings were secured, admission documents were reviewed, and the patient was provided a snack.  Baeley denied thoughts of harming herself and others.  Mood upon arrival was frustrated, but cooperative.  The patient was provided room 302 and 15-minute safety checks were initiated.

## 2022-09-28 NOTE — Progress Notes (Signed)
Pt was accepted to [facility] TODAY   09/28/2022; Bed Assignment 302. Address: [address] [place], [place], [place] [address].   DX:MDD  Pt meets inpatient criteria per [facility] Antonietta Breach, M. D., F. A. C. [facility]   Admission order placed by Tilford Pillar  Attending Physician will be Dr. Shellee Milo  Report can be called to: - [phone] (IVC'd transport requires law enforcement.)  Pt can arrive after: BED IS READY NOW  Care Team notified:[facility] Yvone Neu, Darrold Span, Paramedic, Raford Pitcher   [place], LCSWA 09/28/2022 @ 2:34 PM

## 2022-09-29 ENCOUNTER — Other Ambulatory Visit: Payer: Self-pay

## 2022-09-29 ENCOUNTER — Encounter: Payer: Self-pay | Admitting: Addiction Medicine

## 2022-09-29 DIAGNOSIS — F315 Bipolar disorder, current episode depressed, severe, with psychotic features: Secondary | ICD-10-CM

## 2022-09-29 MED ORDER — DIVALPROEX SODIUM 500 MG PO DR TAB
500.0000 mg | DELAYED_RELEASE_TABLET | Freq: Two times a day (BID) | ORAL | Status: DC
  Administered 2022-09-29 – 2022-10-11 (×24): 500 mg via ORAL
  Filled 2022-09-29 (×23): qty 1

## 2022-09-29 MED ORDER — NICOTINE 14 MG/24HR TD PT24
14.0000 mg | MEDICATED_PATCH | Freq: Every day | TRANSDERMAL | Status: DC
  Administered 2022-09-29 – 2022-09-30 (×2): 14 mg via TRANSDERMAL
  Filled 2022-09-29 (×8): qty 1

## 2022-09-29 MED ORDER — PALIPERIDONE ER 3 MG PO TB24
3.0000 mg | ORAL_TABLET | Freq: Every day | ORAL | Status: DC
  Administered 2022-09-29 – 2022-10-11 (×13): 3 mg via ORAL
  Filled 2022-09-29 (×13): qty 1

## 2022-09-29 NOTE — Plan of Care (Signed)
Problem: Education: Goal: Knowledge of General Education information will improve Description: Including pain rating scale, medication(s)/side effects and non-pharmacologic comfort measures Outcome: Not Progressing   Problem: Health Behavior/Discharge Planning: Goal: Ability to manage health-related needs will improve Outcome: Not Progressing   Problem: Clinical Measurements: Goal: Ability to maintain clinical measurements within normal limits will improve Outcome: Not Progressing Goal: Will remain free from infection Outcome: Not Progressing Goal: Diagnostic test results will improve Outcome: Not Progressing Goal: Respiratory complications will improve Outcome: Not Progressing Goal: Cardiovascular complication will be avoided Outcome: Not Progressing   Problem: Activity: Goal: Risk for activity intolerance will decrease Outcome: Not Progressing   Problem: Nutrition: Goal: Adequate nutrition will be maintained Outcome: Not Progressing   Problem: Coping: Goal: Level of anxiety will decrease Outcome: Not Progressing   Problem: Elimination: Goal: Will not experience complications related to bowel motility Outcome: Not Progressing Goal: Will not experience complications related to urinary retention Outcome: Not Progressing   Problem: Pain Managment: Goal: General experience of comfort will improve Outcome: Not Progressing   Problem: Safety: Goal: Ability to remain free from injury will improve Outcome: Not Progressing   Problem: Skin Integrity: Goal: Risk for impaired skin integrity will decrease Outcome: Not Progressing   Problem: Education: Goal: Knowledge of [place] General Education information/materials will improve Outcome: Not Progressing Goal: Emotional status will improve Outcome: Not Progressing Goal: Mental status will improve Outcome: Not Progressing Goal: Verbalization of understanding the information provided will improve Outcome: Not  Progressing   Problem: Activity: Goal: Interest or engagement in activities will improve Outcome: Not Progressing Goal: Sleeping patterns will improve Outcome: Not Progressing   Problem: Coping: Goal: Ability to verbalize frustrations and anger appropriately will improve Outcome: Not Progressing Goal: Ability to demonstrate self-control will improve Outcome: Not Progressing   Problem: Health Behavior/Discharge Planning: Goal: Identification of resources available to assist in meeting health care needs will improve Outcome: Not Progressing Goal: Compliance with treatment plan for underlying cause of condition will improve Outcome: Not Progressing   Problem: Physical Regulation: Goal: Ability to maintain clinical measurements within normal limits will improve Outcome: Not Progressing   Problem: Safety: Goal: Periods of time without injury will increase Outcome: Not Progressing   Problem: Education: Goal: Ability to make informed decisions regarding treatment will improve Outcome: Not Progressing   Problem: Coping: Goal: Coping ability will improve Outcome: Not Progressing   Problem: Health Behavior/Discharge Planning: Goal: Identification of resources available to assist in meeting health care needs will improve Outcome: Not Progressing   Problem: Medication: Goal: Compliance with prescribed medication regimen will improve Outcome: Not Progressing   Problem: Self-Concept: Goal: Ability to disclose and discuss suicidal ideas will improve Outcome: Not Progressing Goal: Will verbalize positive feelings about self Outcome: Not Progressing   Problem: Education: Goal: Knowledge of [place] General Education information/materials will improve Outcome: Not Progressing Goal: Emotional status will improve Outcome: Not Progressing Goal: Mental status will improve Outcome: Not Progressing Goal: Verbalization of understanding the information provided will  improve Outcome: Not Progressing   Problem: Activity: Goal: Interest or engagement in activities will improve Outcome: Not Progressing Goal: Sleeping patterns will improve Outcome: Not Progressing   Problem: Coping: Goal: Ability to verbalize frustrations and anger appropriately will improve Outcome: Not Progressing Goal: Ability to demonstrate self-control will improve Outcome: Not Progressing   Problem: Health Behavior/Discharge Planning: Goal: Identification of resources available to assist in meeting health care needs will improve Outcome: Not Progressing Goal: Compliance with treatment plan for underlying cause of  condition will improve Outcome: Not Progressing   Problem: Physical Regulation: Goal: Ability to maintain clinical measurements within normal limits will improve Outcome: Not Progressing   Problem: Safety: Goal: Periods of time without injury will increase Outcome: Not Progressing

## 2022-09-29 NOTE — Group Note (Signed)
LCSW Group Therapy Note  Group Date: 09/29/2022 Start Time: 1320 End Time: 1420   Type of Therapy and Topic:  Group Therapy -  Coping Skills  Participation Level:  Active   Description of Group The focus of this group was to determine what unhealthy coping techniques typically are used by group members and what healthy coping techniques would be helpful in coping with various problems. Patients were guided in becoming aware of the differences between healthy and unhealthy coping techniques. Patients were asked to identify 2-3 healthy coping skills they would like to learn to use more effectively.  Therapeutic Goals Patients learned that coping is what human beings do all day long to deal with various situations in their lives Patients defined and discussed healthy vs unhealthy coping techniques Patients identified their preferred coping techniques and identified whether these were healthy or unhealthy Patients determined 2-3 healthy coping skills they would like to become more familiar with and use more often. Patients provided support and ideas to each other   Summary of Patient Progress:  The patient attended group. Patient proved open to input from peers and feedback from [facility]. Patient demonstrated insight into the subject matter, was respectful of peers, and participated throughout the entire session. The patient stated that she like to make jewelry, writing, and doing things to distract her mind.      Marshell Levan, LCSWA 09/29/2022  3:17 PM

## 2022-09-29 NOTE — Group Note (Signed)
Date:  09/29/2022 Time:  11:27 AM  Group Topic/Focus:  Goals Group:   The focus of this group is to help patients establish daily goals to achieve during treatment and discuss how the patient can incorporate goal setting into their daily lives to aide in recovery.    Participation Level:  Did Not Attend    [name] [facility] 09/29/2022, 11:27 AM

## 2022-09-29 NOTE — Progress Notes (Signed)
D- Patient alert and oriented x 3-4. Affect flat/mood congruent and irritable. Denies SI/ HI/ AVH. Patient denies pain. Patient endorses depression and anxiety. Speech and thoughts disorganized and tangential, irritable as well. Preoccupied and fixated on her brother bringing her clothes. Attempted to locate patients medications that were brought to [facility] but unable to obtain. She states prior medication, Fanopt, made her incontinent of urine. Fanopt d/ced. New orders for Invega 3 mg started and Depakote DR 500 mg Q 12 hours to start @[facility] . A- Scheduled medications administered to patient, per MD orders. Support and encouragement provided.  Routine safety checks conducted every 15 minutes without incident.  Patient informed to notify staff with problems or concerns and verbalizes understanding. R- No adverse drug reactions noted.  Patient compliant with medications and treatment plan. Patient cooperative and interacts well with others on the unit.  Patient contracts for safety and  remains safe on the unit at this time.
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
## 2022-09-29 NOTE — Group Note (Signed)
Date:  09/29/2022 Time:  11:33 AM  Group Topic/Focus:  Activity/Outdoor Recreation     Participation Level:  Did Not Attend    [name] [facility] 09/29/2022, 11:33 AM

## 2022-09-29 NOTE — Tx Team (Signed)
Initial Treatment Plan 09/29/2022 12:25 AM [name] ZOX:[id number]    PATIENT STRESSORS: Marital or family conflict     PATIENT STRENGTHS: Capable of independent living  Communication skills  General fund of knowledge    PATIENT IDENTIFIED PROBLEMS:                      DISCHARGE CRITERIA:  Improved stabilization in mood, thinking, and/or behavior Motivation to continue treatment in a less acute level of care  PRELIMINARY DISCHARGE PLAN: Outpatient therapy Return to previous living arrangement  PATIENT/FAMILY INVOLVEMENT: This treatment plan has been presented to and reviewed with the patient, LAKISHIA BASINSKI.  The patient and family have been given the opportunity to ask questions and make suggestions.  Virgina Organ, RN 09/29/2022, 12:25 AM
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
## 2022-09-29 NOTE — Group Note (Signed)
Date:  09/29/2022 Time:  7:21 PM  Group Topic/Focus:  Activity/Outdoor Recreation    Participation Level:  Minimal  Participation Quality:  Appropriate  Affect:  Appropriate  Cognitive:  Appropriate  Insight: Appropriate  Engagement in Group:  Engaged  Modes of Intervention:  Activity  Additional Comments:    [name] [name] 09/29/2022, 7:21 PM
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
## 2022-09-30 DIAGNOSIS — F315 Bipolar disorder, current episode depressed, severe, with psychotic features: Secondary | ICD-10-CM | POA: Diagnosis not present

## 2022-09-30 NOTE — Plan of Care (Signed)
.  h 

## 2022-09-30 NOTE — [facility] Suicide Risk Assessment (Signed)
[facility] INPATIENT:  Family/Significant Other Suicide Prevention Education  Suicide Prevention Education:  Family/Significant Other Refusal to Support Patient after Discharge:  Suicide Prevention Education Not Provided:  Patient has identified home of family/significant other as the place the patient will be residing after discharge.  With written consent of the patient, two attempts were made to provide Suicide Prevention Education to James/brother [phone]).  This person indicates he/she will not be responsible for the patient after discharge.  CSW was provided with verbal permission to call pt's brother regarding her clothes and dog. CSW spoke with pt's brother in pt's prescence so that she could hear the conversation. Fayrene Fearing confirmed that pt could not return to his home. When talking about her things and her getting them, he did share that they would bring her things to wherever she ended up going. During the conversation both pt and brother was agitated but CSW was able to diffuse situation by taking brother off speaker phone, validating his feelings, letting him know that his sister does understand that, and refocusing him on the topic at hand. CSW was able to ascertain that pt's dog is fine and that it will continue to be fine. Conversation with brother was ended. CSW checked with pt as she was becoming tearful. She stated that she was fine. CSW reminded pt that if she needed anything staff here for her to speak with. No other concerns expressed. Contact ended without incident.   CSW gave pt nurse update regarding conversation and her currently mood.   Glenis Smoker 09/30/2022,4:37 PM

## 2022-09-30 NOTE — Progress Notes (Signed)
   09/30/22 1200  Psych Admission Type (Psych Patients Only)  Admission Status Involuntary  Psychosocial Assessment  Patient Complaints Depression;Worrying  Eye Contact Brief  Facial Expression Sad  Affect Depressed;Sad  Speech Soft  Interaction Minimal  Motor Activity Slow  Appearance/Hygiene In scrubs  Behavior Characteristics Pacing;Guarded  Mood Depressed;Sad  Thought Process  Coherency Circumstantial  Content Preoccupation  Delusions None reported or observed  Perception WDL  Hallucination None reported or observed  Judgment Limited  Confusion Mild  Danger to Self  Current suicidal ideation? Denies  Agreement Not to Harm Self Yes  Danger to Others  Danger to Others None reported or observed   Multiple change of clothing this shift due to incontinence. Tolerated all medications and meals. Denies SI/HI/AVH.

## 2022-09-30 NOTE — Progress Notes (Signed)
[facility] MD Progress Note  09/30/2022 10:34 AM Chloe [name]  MRN:  [id number]  Subjective:   57 yo female presented with aggression after not taking her medications, history of bipolar d/o.  Notes, labs, vital signs, and discussion in progression meeting reviewed prior to her assessment.  She reports moderate anxiety related to where she will be living after discharging and retrieving her things including her dog.  Sleep and appetite are good.  Irritable mood at times with moderate depression, no suicidal ideations.  In treatment team her goal was "to get out of here". No insight to the behaviors that placed her here, blaming others which makes her angry and act out.  No side effects from her medications.  Principal Problem: Bipolar affective disorder, depressed, severe, with psychotic behavior (HCC) Diagnosis: Principal Problem:   Bipolar affective disorder, depressed, severe, with psychotic behavior (HCC)  Total Time spent with patient: 30 minutes  Past Psychiatric History: bipolar d/o  Past Medical History:  Past Medical History:  Diagnosis Date   Anxiety    Bipolar disorder (HCC)    Breast mass, right    GERD (gastroesophageal reflux disease)    Hyperlipidemia    Post-operative nausea and vomiting    Smoker     Past Surgical History:  Procedure Laterality Date   ABDOMINAL HYSTERECTOMY     had nausea and vomiting post-op   BREAST LUMPECTOMY WITH RADIOACTIVE SEED LOCALIZATION Right 11/12/2016   Procedure: RIGHT BREAST LUMPECTOMY WITH RADIOACTIVE SEED LOCALIZATION;  Surgeon: Harriette Bouillon, MD;  Location:  SURGERY CENTER;  Service: General;  Laterality: Right;   CHOLECYSTECTOMY     LAPAROSCOPIC LYSIS OF ADHESIONS     MYOMECTOMY     PLANTAR FASCIA SURGERY Left    Family History:  Family History  Problem Relation Age of Onset   Lung cancer Mother    Breast cancer Maternal Aunt    Hyperlipidemia Brother    Colon cancer Neg Hx    Esophageal cancer Neg Hx    Stomach  cancer Neg Hx    Family Psychiatric  History: none Social History:  Social History   Substance and Sexual Activity  Alcohol Use Not Currently   Comment: social     Social History   Substance and Sexual Activity  Drug Use No    Social History   Socioeconomic History   Marital status: Divorced    Spouse name: Not on file   Number of children: Not on file   Years of education: Not on file   Highest education level: Associate degree: occupational, Scientist, product/process development, or vocational program  Occupational History   Not on file  Tobacco Use   Smoking status: Every Day    Current packs/day: 0.50    Types: Cigarettes   Smokeless tobacco: Never  Vaping Use   Vaping status: Never Used  Substance and Sexual Activity   Alcohol use: Not Currently    Comment: social   Drug use: No   Sexual activity: Not Currently  Other Topics Concern   Not on file  Social History Narrative   Not on file   Social Determinants of Health   Financial Resource Strain: High Risk (03/26/2022)   Overall Financial Resource Strain (CARDIA)    Difficulty of Paying Living Expenses: Hard  Food Insecurity: No Food Insecurity (09/29/2022)   Hunger Vital Sign    Worried About Running Out of Food in the Last Year: Never true    Ran Out of Food in the Last Year: Never  true  Recent Concern: Food Insecurity - Food Insecurity Present (07/01/2022)   Hunger Vital Sign    Worried About Running Out of Food in the Last Year: Often true    Ran Out of Food in the Last Year: Often true  Transportation Needs: No Transportation Needs (09/29/2022)   PRAPARE - Administrator, Civil Service (Medical): No    Lack of Transportation (Non-Medical): No  Recent Concern: Transportation Needs - Unmet Transportation Needs (07/01/2022)   PRAPARE - Transportation    Lack of Transportation (Medical): No    Lack of Transportation (Non-Medical): Yes  Physical Activity: Inactive (03/26/2022)   Exercise Vital Sign    Days of Exercise per  Week: 0 days    Minutes of Exercise per Session: 0 min  Stress: Stress Concern Present (03/26/2022)   [employer] of Occupational Health - Occupational Stress Questionnaire    Feeling of Stress : Very much  Social Connections: Socially Isolated (03/26/2022)   Social Connection and Isolation Panel [NHANES]    Frequency of Communication with Friends and Family: Twice a week    Frequency of Social Gatherings with Friends and Family: Once a week    Attends Religious Services: Never    Database administrator or Organizations: No    Attends Engineer, structural: Never    Marital Status: Divorced   Additional Social History: homeless at this time   Sleep: Good  Appetite:  Good  Current Medications: Current Facility-Administered Medications  Medication Dose Route Frequency Provider Last Rate Last Admin   acetaminophen (TYLENOL) tablet 650 mg  650 mg Oral Q4H PRN Oneta Rack, NP       albuterol (VENTOLIN HFA) 108 (90 Base) MCG/ACT inhaler 2 puff  2 puff Inhalation Q4H PRN Oneta Rack, NP       alum & mag hydroxide-simeth (MAALOX/MYLANTA) 200-200-20 MG/5ML suspension 30 mL  30 mL Oral Q4H PRN Oneta Rack, NP       diphenhydrAMINE (BENADRYL) capsule 50 mg  50 mg Oral TID PRN Oneta Rack, NP       Or   diphenhydrAMINE (BENADRYL) injection 50 mg  50 mg Intramuscular TID PRN Oneta Rack, NP       divalproex (DEPAKOTE) DR tablet 500 mg  500 mg Oral Q12H Charm Rings, NP   500 mg at 09/30/22 6213   haloperidol (HALDOL) tablet 5 mg  5 mg Oral TID PRN Oneta Rack, NP       Or   haloperidol lactate (HALDOL) injection 5 mg  5 mg Intramuscular TID PRN Oneta Rack, NP       hydrOXYzine (ATARAX) tablet 10 mg  10 mg Oral TID PRN Oneta Rack, NP       LORazepam (ATIVAN) tablet 2 mg  2 mg Oral TID PRN Oneta Rack, NP       Or   LORazepam (ATIVAN) injection 2 mg  2 mg Intramuscular TID PRN Oneta Rack, NP       magnesium hydroxide (MILK OF MAGNESIA)  suspension 30 mL  30 mL Oral Daily PRN Oneta Rack, NP       mometasone-formoterol (DULERA) 100-5 MCG/ACT inhaler 2 puff  2 puff Inhalation BID Oneta Rack, NP   2 puff at 09/30/22 0907   nicotine (NICODERM CQ - dosed in mg/24 hours) patch 14 mg  14 mg Transdermal Daily Sarina Ill, DO   14 mg at 09/30/22 0865  ondansetron (ZOFRAN) tablet 4 mg  4 mg Oral Q8H PRN Oneta Rack, NP       paliperidone (INVEGA) 24 hr tablet 3 mg  3 mg Oral Daily Charm Rings, NP   3 mg at 09/30/22 0905   pantoprazole (PROTONIX) EC tablet 40 mg  40 mg Oral Daily Oneta Rack, NP   40 mg at 09/30/22 0905   pravastatin (PRAVACHOL) tablet 40 mg  40 mg Oral QPM Oneta Rack, NP   40 mg at 09/29/22 1806    Lab Results: No results found for this or any previous visit (from the past 48 hour(s)).  Blood Alcohol level:  Lab Results  Component Value Date   ETH <10 09/27/2022   ETH <10 11/24/2017    Metabolic Disorder Labs: Lab Results  Component Value Date   HGBA1C 5.4 07/01/2022   MPG 102.54 11/30/2017   Lab Results  Component Value Date   PROLACTIN 12.4 11/30/2017   Lab Results  Component Value Date   CHOL 151 04/04/2022   TRIG 118 04/04/2022   HDL 44 04/04/2022   CHOLHDL 3.4 04/04/2022   VLDL 18 11/30/2017   LDLCALC 86 04/04/2022   LDLCALC 135 (H) 08/30/2021     Musculoskeletal: Strength & Muscle Tone: within normal limits Gait & Station: normal Patient leans: N/A  Psychiatric Specialty Exam: Physical Exam Vitals reviewed.  Constitutional:      Appearance: Normal appearance.  HENT:     Head: Normocephalic.     Nose: Nose normal.  Pulmonary:     Effort: Pulmonary effort is normal.  Musculoskeletal:        General: Normal range of motion.     Cervical back: Normal range of motion.  Neurological:     General: No focal deficit present.     Mental Status: She is alert and oriented to person, place, and time.  Psychiatric:        Attention and Perception:  Attention and perception normal.        Mood and Affect: Mood is anxious and depressed.        Speech: Speech normal.        Behavior: Behavior normal. Behavior is cooperative.        Thought Content: Thought content normal.        Cognition and Memory: Cognition and memory normal.        Judgment: Judgment is impulsive.     Review of Systems  Psychiatric/Behavioral:  Positive for depression. The patient is nervous/anxious.   All other systems reviewed and are negative.   Blood pressure (!) 105/51, pulse 69, temperature (!) 97.4 F (36.3 C), temperature source Oral, resp. rate (!) 22, height 5\' 1"  (1.549 m), weight 61.2 kg, SpO2 94%.Body mass index is 25.51 kg/m.  General Appearance: Casual  Eye Contact:  Fair  Speech:  Normal Rate  Volume:  Normal  Mood:  Anxious, Depressed, and Irritable  Affect:  Congruent  Thought Process:  Coherent  Orientation:  Full (Time, Place, and Person)  Thought Content:  Rumination  Suicidal Thoughts:  No  Homicidal Thoughts:  No  Memory:  Immediate;   Fair Recent;   Fair Remote;   Fair  Judgement:  Poor  Insight:  Lacking  Psychomotor Activity:  Normal  Concentration:  Concentration: Fair and Attention Span: Fair  Recall:  [employer] of Knowledge:  Fair  Language:  Good  Akathisia:  No  Handed:  Right  AIMS (if indicated):  Assets:  Leisure Time Physical Health Resilience Social Support  ADL's:  Intact  Cognition:  WNL  Sleep:         Physical Exam: Physical Exam Vitals reviewed.  Constitutional:      Appearance: Normal appearance.  HENT:     Head: Normocephalic.     Nose: Nose normal.  Pulmonary:     Effort: Pulmonary effort is normal.  Musculoskeletal:        General: Normal range of motion.     Cervical back: Normal range of motion.  Neurological:     General: No focal deficit present.     Mental Status: She is alert and oriented to person, place, and time.  Psychiatric:        Attention and Perception: Attention  and perception normal.        Mood and Affect: Mood is anxious and depressed.        Speech: Speech normal.        Behavior: Behavior normal. Behavior is cooperative.        Thought Content: Thought content normal.        Cognition and Memory: Cognition and memory normal.        Judgment: Judgment is impulsive.    Review of Systems  Psychiatric/Behavioral:  Positive for depression. The patient is nervous/anxious.   All other systems reviewed and are negative.  Blood pressure (!) 105/51, pulse 69, temperature (!) 97.4 F (36.3 C), temperature source Oral, resp. rate (!) 22, height 5\' 1"  (1.549 m), weight 61.2 kg, SpO2 94%. Body mass index is 25.51 kg/m.   Treatment Plan Summary: Daily contact with patient to assess and evaluate symptoms and progress in treatment, Medication management, and Plan : Bipolar affective disorder, depressed, severe: Lamictal 200 mg BID was discontinued per her psych provider per her POA, Ranae [place] 6 mg BID that she recently started, pharmacy does not carry in the pharmacy here and her brother reported it is not helping her and she is incontinent on it, discontinued Invega 3 mg started instead per pharmacy recommendations as the closest alternative with less side effects. Depakote 500 mg BID EKG, lipid panel, TSH, and A1C ordered; awaiting results   Anxiety: Hydroxyzine 10 mg TID PRN   Nanine Means, NP 09/30/2022, 10:34 AM

## 2022-09-30 NOTE — Group Note (Unsigned)
Date:  09/30/2022 Time:  11:08 PM  Group Topic/Focus:  Wrap-Up Group:   The focus of this group is to help patients review their daily goal of treatment and discuss progress on daily workbooks.     Participation Level:  {BHH PARTICIPATION XBMWU:13244}  Participation Quality:  {BHH PARTICIPATION QUALITY:22265}  Affect:  {BHH AFFECT:22266}  Cognitive:  {BHH COGNITIVE:22267}  Insight: {BHH Insight2:20797}  Engagement in Group:  {BHH ENGAGEMENT IN WNUUV:25366}  Modes of Intervention:  {BHH MODES OF INTERVENTION:22269}  Additional Comments:  ***  Chloe [name] 09/30/2022, 11:08 PM

## 2022-09-30 NOTE — Group Note (Unsigned)
Date:  09/30/2022 Time:  10:53 PM  Group Topic/Focus:  Wrap-Up Group:   The focus of this group is to help patients review their daily goal of treatment and discuss progress on daily workbooks.     Participation Level:  {BHH PARTICIPATION ZOXWR:60454}  Participation Quality:  {BHH PARTICIPATION QUALITY:22265}  Affect:  {BHH AFFECT:22266}  Cognitive:  {BHH COGNITIVE:22267}  Insight: {BHH Insight2:20797}  Engagement in Group:  {BHH ENGAGEMENT IN UJWJX:91478}  Modes of Intervention:  {BHH MODES OF INTERVENTION:22269}  Additional Comments:  ***  Germaine Ripp 09/30/2022, 10:53 PM

## 2022-09-30 NOTE — BHH Counselor (Signed)
Pt stopped CSW in the hallway after group. She stated that she was having some trouble with one of the staff and explained that she had been given a paper bag to deal with her incontinence issues. She asked why she could not be given a rubber trash can. CSW offered that the nature of this unit, no one has a rubber trash can in their room due to safety issues. Pt voiced understanding. She also stated that she wanted to know why she could not call anyone. CSW explained how the phone works, the phone hours, and if someone called to the nursing station that they would need her code. Pt said she had never been given a code so CSW showed pt how the code was determined. She thanked CSW for his assistance. She shared that what she wants to do is call her brother/sister-in-law to see about her things. CSW asked pt to give him some time to address some things in the nurses station and then he would return to assist her with the call. She agreed. No other concerns expressed. Contact ended without incident.  Chloe [name]. Algis Greenhouse, MSW, LCSW, LCAS 09/30/2022 4:36 PM

## 2022-09-30 NOTE — Group Note (Unsigned)
Date:  09/30/2022 Time:  11:00 PM  Group Topic/Focus:  Wrap-Up Group:   The focus of this group is to help patients review their daily goal of treatment and discuss progress on daily workbooks.     Participation Level:  {BHH PARTICIPATION ZOXWR:60454}  Participation Quality:  {BHH PARTICIPATION QUALITY:22265}  Affect:  {BHH AFFECT:22266}  Cognitive:  {BHH COGNITIVE:22267}  Insight: {BHH Insight2:20797}  Engagement in Group:  {BHH ENGAGEMENT IN UJWJX:91478}  Modes of Intervention:  {BHH MODES OF INTERVENTION:22269}  Additional Comments:  ***  [name] 09/30/2022, 11:00 PM

## 2022-09-30 NOTE — Group Note (Signed)
Date:  09/30/2022 Time:  6:19 AM  Group Topic/Focus:  Wrap-Up Group:   The focus of this group is to help patients review their daily goal of treatment and discuss progress on daily workbooks.    Participation Level:  Active  Participation Quality:  Appropriate and Attentive  Affect:  Appropriate  Cognitive:  Alert and Appropriate  Insight: Appropriate  Engagement in Group:  Engaged and Limited  Modes of Intervention:  Activity  Additional Comments:     [name],Chloe Billingham E 09/30/2022, 6:19 AM

## 2022-09-30 NOTE — BH IP Treatment Plan (Signed)
Interdisciplinary Treatment and Diagnostic Plan Update  09/30/2022 Time of Session: 9:30AM Chloe [name] MRN: [id number]  Principal Diagnosis: Bipolar affective disorder, depressed, severe, with psychotic behavior (HCC)  Secondary Diagnoses: Principal Problem:   Bipolar affective disorder, depressed, severe, with psychotic behavior (HCC)   Current Medications:  Current Facility-Administered Medications  Medication Dose Route Frequency Provider Last Rate Last Admin   acetaminophen (TYLENOL) tablet 650 mg  650 mg Oral Q4H PRN Oneta Rack, NP       albuterol (VENTOLIN HFA) 108 (90 Base) MCG/ACT inhaler 2 puff  2 puff Inhalation Q4H PRN Oneta Rack, NP       alum & mag hydroxide-simeth (MAALOX/MYLANTA) 200-200-20 MG/5ML suspension 30 mL  30 mL Oral Q4H PRN Oneta Rack, NP       diphenhydrAMINE (BENADRYL) capsule 50 mg  50 mg Oral TID PRN Oneta Rack, NP       Or   diphenhydrAMINE (BENADRYL) injection 50 mg  50 mg Intramuscular TID PRN Oneta Rack, NP       divalproex (DEPAKOTE) DR tablet 500 mg  500 mg Oral Q12H Charm Rings, NP   500 mg at 09/30/22 2595   haloperidol (HALDOL) tablet 5 mg  5 mg Oral TID PRN Oneta Rack, NP       Or   haloperidol lactate (HALDOL) injection 5 mg  5 mg Intramuscular TID PRN Oneta Rack, NP       hydrOXYzine (ATARAX) tablet 10 mg  10 mg Oral TID PRN Oneta Rack, NP       LORazepam (ATIVAN) tablet 2 mg  2 mg Oral TID PRN Oneta Rack, NP       Or   LORazepam (ATIVAN) injection 2 mg  2 mg Intramuscular TID PRN Oneta Rack, NP       magnesium hydroxide (MILK OF MAGNESIA) suspension 30 mL  30 mL Oral Daily PRN Oneta Rack, NP       mometasone-formoterol (DULERA) 100-5 MCG/ACT inhaler 2 puff  2 puff Inhalation BID Oneta Rack, NP   2 puff at 09/30/22 0907   nicotine (NICODERM CQ - dosed in mg/24 hours) patch 14 mg  14 mg Transdermal Daily Sarina Ill, DO   14 mg at 09/30/22 0905   ondansetron (ZOFRAN)  tablet 4 mg  4 mg Oral Q8H PRN Oneta Rack, NP       paliperidone (INVEGA) 24 hr tablet 3 mg  3 mg Oral Daily Charm Rings, NP   3 mg at 09/30/22 0905   pantoprazole (PROTONIX) EC tablet 40 mg  40 mg Oral Daily Oneta Rack, NP   40 mg at 09/30/22 0905   pravastatin (PRAVACHOL) tablet 40 mg  40 mg Oral QPM Oneta Rack, NP   40 mg at 09/29/22 1806   PTA Medications: Medications Prior to Admission  Medication Sig Dispense Refill Last Dose   albuterol (PROVENTIL HFA) 108 (90 Base) MCG/ACT inhaler Inhale 2 puffs into the lungs every 4 (four) hours as needed for wheezing or shortness of breath. (Patient not taking: Reported on 09/03/2022) 6.7 g 1    cetirizine (ZYRTEC ALLERGY) 10 MG tablet Take 1 tablet (10 mg total) by mouth daily as needed for allergies. 100 tablet 1    fluticasone-salmeterol (ADVAIR) 100-50 MCG/ACT AEPB Inhale 1 puff into the lungs 2 (two) times daily. (Patient not taking: Reported on 09/03/2022) 60 each 3    Iloperidone (FANAPT) 6 MG  TABS Take 1 tablet (6 mg total) by mouth 2 (two) times daily. 60 tablet 3    lamoTRIgine (LAMICTAL) 200 MG tablet Take 1 tablet (200 mg total) by mouth 2 (two) times daily. 60 tablet 3    omeprazole (PRILOSEC) 20 MG capsule Take 1 capsule (20 mg total) by mouth daily. 30 capsule 2    pravastatin (PRAVACHOL) 40 MG tablet Take 1 tablet (40 mg total) by mouth daily. 90 tablet 1     Patient Stressors: Marital or family conflict    Patient Strengths: Capable of independent living  Forensic psychologist fund of knowledge   Treatment Modalities: Medication Management, Group therapy, Case management,  1 to 1 session with clinician, Psychoeducation, Recreational therapy.   Physician Treatment Plan for Primary Diagnosis: Bipolar affective disorder, depressed, severe, with psychotic behavior (HCC) Long Term Goal(s): Improvement in symptoms so as ready for discharge   Short Term Goals: Ability to identify changes in lifestyle to  reduce recurrence of condition will improve Ability to verbalize feelings will improve Ability to disclose and discuss suicidal ideas Ability to demonstrate self-control will improve Ability to identify and develop effective coping behaviors will improve Ability to maintain clinical measurements within normal limits will improve Compliance with prescribed medications will improve Ability to identify triggers associated with substance abuse/mental health issues will improve  Medication Management: Evaluate patient's response, side effects, and tolerance of medication regimen.  Therapeutic Interventions: 1 to 1 sessions, Unit Group sessions and Medication administration.  Evaluation of Outcomes: Not Met  Physician Treatment Plan for Secondary Diagnosis: Principal Problem:   Bipolar affective disorder, depressed, severe, with psychotic behavior (HCC)  Long Term Goal(s): Improvement in symptoms so as ready for discharge   Short Term Goals: Ability to identify changes in lifestyle to reduce recurrence of condition will improve Ability to verbalize feelings will improve Ability to disclose and discuss suicidal ideas Ability to demonstrate self-control will improve Ability to identify and develop effective coping behaviors will improve Ability to maintain clinical measurements within normal limits will improve Compliance with prescribed medications will improve Ability to identify triggers associated with substance abuse/mental health issues will improve     Medication Management: Evaluate patient's response, side effects, and tolerance of medication regimen.  Therapeutic Interventions: 1 to 1 sessions, Unit Group sessions and Medication administration.  Evaluation of Outcomes: Not Met   RN Treatment Plan for Primary Diagnosis: Bipolar affective disorder, depressed, severe, with psychotic behavior (HCC) Long Term Goal(s): Knowledge of disease and therapeutic regimen to maintain health  will improve  Short Term Goals: Ability to verbalize frustration and anger appropriately will improve, Ability to demonstrate self-control, Ability to participate in decision making will improve, Ability to verbalize feelings will improve, Ability to disclose and discuss suicidal ideas, Ability to identify and develop effective coping behaviors will improve, and Compliance with prescribed medications will improve  Medication Management: RN will administer medications as ordered by provider, will assess and evaluate patient's response and provide education to patient for prescribed medication. RN will report any adverse and/or side effects to prescribing provider.  Therapeutic Interventions: 1 on 1 counseling sessions, Psychoeducation, Medication administration, Evaluate responses to treatment, Monitor vital signs and CBGs as ordered, Perform/monitor CIWA, COWS, AIMS and Fall Risk screenings as ordered, Perform wound care treatments as ordered.  Evaluation of Outcomes: Not Met   LCSW Treatment Plan for Primary Diagnosis: Bipolar affective disorder, depressed, severe, with psychotic behavior (HCC) Long Term Goal(s): Safe transition to appropriate next level of care at  discharge, Engage patient in therapeutic group addressing interpersonal concerns.  Short Term Goals: Engage patient in aftercare planning with referrals and resources, Increase social support, Increase ability to appropriately verbalize feelings, Increase emotional regulation, Facilitate acceptance of mental health diagnosis and concerns, Facilitate patient progression through stages of change regarding substance use diagnoses and concerns, Identify triggers associated with mental health/substance abuse issues, and Increase skills for wellness and recovery  Therapeutic Interventions: Assess for all discharge needs, 1 to 1 time with Social worker, Explore available resources and support systems, Assess for adequacy in community support  network, Educate family and significant other(s) on suicide prevention, Complete Psychosocial Assessment, Interpersonal group therapy.  Evaluation of Outcomes: Not Met   Progress in Treatment: Attending groups: Yes. Participating in groups: Yes. Taking medication as prescribed: Yes. Toleration medication: Yes. Family/Significant other contact made: No, will contact:  once permission has been given. Patient understands diagnosis: Yes. Discussing patient identified problems/goals with staff: Yes. Medical problems stabilized or resolved: Yes. Denies suicidal/homicidal ideation: Yes. Issues/concerns per patient self-inventory: No. Other: none  New problem(s) identified: No, Describe:  none  New Short Term/Long Term Goal(s): detox, elimination of symptoms of psychosis, medication management for mood stabilization; elimination of SI thoughts; development of comprehensive mental wellness/sobriety plan.   Patient Goals:  "I take my medicine.  I have some other things on my mind that I would want confirmation about.  Discharge Plan or Barriers:  CSW to assist patient in development of appropriate discharge plans.     Reason for Continuation of Hospitalization: Aggression Anxiety Delusions  Depression Hallucinations Medical Issues Medication stabilization Suicidal ideation  Estimated Length of Stay:  1-7 days  Last 3 [place] Suicide Severity Risk Score: Flowsheet Row Admission (Current) from 09/28/2022 in [facility] INPATIENT BEH[facility]INE ED from 09/27/2022 [facility] Emergency Department at [facility] Office Visit from 03/26/2022 in [facility]  C-SSRS RISK CATEGORY No Risk No Risk No Risk       Last PHQ 2/9 Scores:    07/01/2022    5:10 PM 04/04/2022    9:49 AM 03/26/2022   11:18 AM  Depression screen PHQ 2/9  Decreased Interest 2  0  Down, Depressed, Hopeless 2 2 2   PHQ - 2 Score 4 2 2   Altered sleeping 2 1 0  Tired, decreased energy  2 2   Change in appetite 3 2   Feeling bad or failure about yourself  3 3   Trouble concentrating 3 3   Moving slowly or fidgety/restless 3 3   Suicidal thoughts 1 0   PHQ-9 Score 21 16 2     Scribe for Treatment Team: Harden Mo, LCSW 09/30/2022 1:17 PM

## 2022-09-30 NOTE — Group Note (Unsigned)
Date:  09/30/2022 Time:  10:59 PM  Group Topic/Focus:  Wrap-Up Group:   The focus of this group is to help patients review their daily goal of treatment and discuss progress on daily workbooks.     Participation Level:  {BHH PARTICIPATION ZOXWR:60454}  Participation Quality:  {BHH PARTICIPATION QUALITY:22265}  Affect:  {BHH AFFECT:22266}  Cognitive:  {BHH COGNITIVE:22267}  Insight: {BHH Insight2:20797}  Engagement in Group:  {BHH ENGAGEMENT IN UJWJX:91478}  Modes of Intervention:  {BHH MODES OF INTERVENTION:22269}  Additional Comments:  ***  Jakory Matsuo 09/30/2022, 10:59 PM

## 2022-09-30 NOTE — Group Note (Signed)
Recreation Therapy Group Note   Group Topic:General Recreation  Group Date: 09/30/2022 Start Time: 1015 End Time: 1120 Facilitators: Rosina Lowenstein, LRT, CTRS Location: Courtyard  Group Description: Outdoor Recreation. Patients had the option to play basketball, play with a deck of cards or sit and listen to music while outside in the courtyard getting fresh air and sunlight. LRT and pts discussed things that they enjoy doing in their free time outside of the hospital.   Goal Area(s) Addressed: Patient will identify leisure interests.  Patient will practice healthy decision making. Patient will engage in recreation activity.   Affect/Mood: Appropriate   Participation Level: Minimal    Clinical Observations/Individualized Feedback: Teshawna came late to group and stayed maybe 5 minutes.   Plan: Continue to engage patient in RT group sessions 2-3x/week.   Rosina Lowenstein, LRT, CTRS 09/30/2022 1:28 PM

## 2022-09-30 NOTE — Progress Notes (Signed)
Nursing Shift Note:  1900-0700  Attended Evening Group: yes Medication Compliant:  yes Behavior: depressed and irritable Sleep Quality: broken Significant Changes: none  The patient continues to display irritability, hopelessness, and helplessness.  Denies suicidal ideation and no unsafe behaviors noted.

## 2022-09-30 NOTE — Group Note (Unsigned)
Date:  09/30/2022 Time:  11:06 PM  Group Topic/Focus:  Wrap-Up Group:   The focus of this group is to help patients review their daily goal of treatment and discuss progress on daily workbooks.     Participation Level:  {BHH PARTICIPATION ZOXWR:60454}  Participation Quality:  {BHH PARTICIPATION QUALITY:22265}  Affect:  {BHH AFFECT:22266}  Cognitive:  {BHH COGNITIVE:22267}  Insight: {BHH Insight2:20797}  Engagement in Group:  {BHH ENGAGEMENT IN UJWJX:91478}  Modes of Intervention:  {BHH MODES OF INTERVENTION:22269}  Additional Comments:  ***  Chloe [name] 09/30/2022, 11:06 PM

## 2022-09-30 NOTE — Group Note (Signed)
Date:  09/30/2022 Time:  4:27 PM  Group Topic/Focus:  OUTDOOR RECREATION ACTIVITY    Participation Level:  Did Not Attend   Rosaura Carpenter 09/30/2022, 4:27 PM

## 2022-09-30 NOTE — Group Note (Signed)
Date:  09/30/2022 Time:  10:14 AM  Group Topic/Focus:  Dimensions of Wellness:   The focus of this group is to introduce the topic of wellness and discuss the role each dimension of wellness plays in total health.    Participation Level:  Did Not Attend   Chloe [name] 09/30/2022, 10:14 AM

## 2022-09-30 NOTE — Group Note (Signed)
[facility] LCSW Group Therapy Note    Group Date: 09/30/2022 Start Time: 1310 End Time: 1410  Type of Therapy and Topic:  Group Therapy:  Overcoming Obstacles  Participation Level:  BHH PARTICIPATION LEVEL: Did Not Attend   Description of Group:   In this group patients will be encouraged to explore what they see as obstacles to their own wellness and recovery. They will be guided to discuss their thoughts, feelings, and behaviors related to these obstacles. The group will process together ways to cope with barriers, with attention given to specific choices patients can make. Each patient will be challenged to identify changes they are motivated to make in order to overcome their obstacles. This group will be process-oriented, with patients participating in exploration of their own experiences as well as giving and receiving support and challenge from other group members.  Therapeutic Goals: 1. Patient will identify personal and current obstacles as they relate to admission. 2. Patient will identify barriers that currently interfere with their wellness or overcoming obstacles.  3. Patient will identify feelings, thought process and behaviors related to these barriers. 4. Patient will identify two changes they are willing to make to overcome these obstacles:    Summary of Patient Progress X   Therapeutic Modalities:   Cognitive Behavioral Therapy Solution Focused Therapy Motivational Interviewing Relapse Prevention Therapy   Glenis Smoker, LCSW

## 2022-10-01 DIAGNOSIS — F315 Bipolar disorder, current episode depressed, severe, with psychotic features: Principal | ICD-10-CM

## 2022-10-01 MED ORDER — VITAMINS A & D EX OINT
TOPICAL_OINTMENT | CUTANEOUS | Status: DC | PRN
  Administered 2022-10-02: 1 via TOPICAL
  Filled 2022-10-01: qty 113

## 2022-10-01 NOTE — Group Note (Signed)
Date:  10/01/2022 Time:  4:04 PM  Group Topic/Focus: Helps provide  a way to express emotions and experiences not easily expressed in words .It is about healing through the process of Art, including music , writing and crosswords puzzles. Healing through Art.     Participation Level:  Active  Participation Quality:  Appropriate and Attentive  Affect:  Appropriate  Cognitive:  Alert, Appropriate, and Oriented  Insight: Appropriate  Engagement in Group:  Developing/Improving and Supportive  Modes of Intervention:  Activity, Discussion, Rapport Building, and Socialization  Additional Comments:    Rosaura Carpenter 10/01/2022, 4:04 PM

## 2022-10-01 NOTE — Progress Notes (Signed)
Nursing Shift Note:  1900-0700  Attended Evening Group: Did not attend Medication Compliant:  Yes Behavior: Generally calm and cooperative; slightly irritable Sleep Quality: Fair Significant Changes:None    10/01/22 2000  Psych Admission Type (Psych Patients Only)  Admission Status Involuntary  Psychosocial Assessment  Patient Complaints Depression  Eye Contact Brief  Facial Expression Sad  Affect Depressed  Speech Soft  Interaction Assertive  Motor Activity Slow  Appearance/Hygiene In scrubs  Behavior Characteristics Irritable  Mood Depressed;Sad  Thought Process  Coherency WDL  Content Preoccupation  Delusions None reported or observed  Perception WDL  Hallucination None reported or observed  Judgment Limited  Confusion Mild  Danger to Self  Current suicidal ideation? Denies  Danger to Others  Danger to Others None reported or observed

## 2022-10-01 NOTE — Group Note (Signed)
LCSW Group Therapy Note  Group Date: 10/01/2022 Start Time: 1330 End Time: 1430   Type of Therapy and Topic:  Group Therapy: Values  Participation Level:  Active   Description of Group:   This group addressed values in self, friends, family and society.  Patients went around the room and identified their core values.  Group discussion centered on how these values impact our wellbeing.  Patients reflected on how it felt to share values and how there can be similarities and differences in shared values.  Patients were encouraged to have a daily reflection of positive characteristics or circumstances.  Therapeutic Goals: Patients will verbalize five of their values  Patients will verbalize their feelings when voicing values within the different systems to include "myself, family, friends, society"  Patients will discuss the potential positive impact on their wellness/recovery of focusing on positive traits of self and others.   Summary of Patient Progress:   X  Therapeutic Modalities:   Cognitive Behavioral Therapy Motivational Interviewing    Claudie Fisherman 10/01/2022  3:25 PM

## 2022-10-01 NOTE — Progress Notes (Addendum)
Nursing Shift Note:  1900-0700  Attended Evening Group: yes Medication Compliant: yes  Behavior: frustrated and sullen Sleep Quality: good Significant Changes: none  Patient shows evidence of confusion, memory issues, and communications deficits.  Chloe [name] was unable to repeat back education provided the prior night pertaining to her ordered Depakote.  The patient was unable to provide a physical description of her provider.  Continued urinary incontinence (ample supplies provided in the patient's room).  Keira denied thoughts of self-harm, but remains depressed.    Falls Risk

## 2022-10-01 NOTE — Progress Notes (Signed)
[facility] MD Progress Note  10/01/2022 4:41 PM Chloe [name]  MRN:  [id number]  Subjective:   57 yo female presented with aggression after not taking her medications, history of bipolar d/o.  Notes, labs, vital signs, and discussion in progression meeting reviewed prior to her assessment.  She stated her depression was low,no suicidal ideations.  Appetite is "good", sleep is "pretty good".  Anxiety over being worried about where she will live and getting her things.  She did state "I'm better than yesterday."  She is working on "talking and socializing" with other clients to help her mood.  Denies side effects from her medications.  Her GI/GU issues are resolving.  Principal Problem: Bipolar affective disorder, depressed, severe, with psychotic behavior (HCC) Diagnosis: Principal Problem:   Bipolar affective disorder, depressed, severe, with psychotic behavior (HCC)  Total Time spent with patient: 30 minutes  Past Psychiatric History: bipolar d/o  Past Medical History:  Past Medical History:  Diagnosis Date   Anxiety    Bipolar disorder (HCC)    Breast mass, right    GERD (gastroesophageal reflux disease)    Hyperlipidemia    Post-operative nausea and vomiting    Smoker     Past Surgical History:  Procedure Laterality Date   ABDOMINAL HYSTERECTOMY     had nausea and vomiting post-op   BREAST LUMPECTOMY WITH RADIOACTIVE SEED LOCALIZATION Right 11/12/2016   Procedure: RIGHT BREAST LUMPECTOMY WITH RADIOACTIVE SEED LOCALIZATION;  Surgeon: Harriette Bouillon, MD;  Location: [facility];  Service: General;  Laterality: Right;   CHOLECYSTECTOMY     LAPAROSCOPIC LYSIS OF ADHESIONS     MYOMECTOMY     PLANTAR FASCIA SURGERY Left    Family History:  Family History  Problem Relation Age of Onset   Lung cancer Mother    Breast cancer Maternal Aunt    Hyperlipidemia Brother    Colon cancer Neg Hx    Esophageal cancer Neg Hx    Stomach cancer Neg Hx    Family Psychiatric   History: none Social History:  Social History   Substance and Sexual Activity  Alcohol Use Not Currently   Comment: social     Social History   Substance and Sexual Activity  Drug Use No    Social History   Socioeconomic History   Marital status: Divorced    Spouse name: Not on file   Number of children: Not on file   Years of education: Not on file   Highest education level: Associate degree: occupational, Scientist, product/process development, or vocational program  Occupational History   Not on file  Tobacco Use   Smoking status: Every Day    Current packs/day: 0.50    Types: Cigarettes   Smokeless tobacco: Never  Vaping Use   Vaping status: Never Used  Substance and Sexual Activity   Alcohol use: Not Currently    Comment: social   Drug use: No   Sexual activity: Not Currently  Other Topics Concern   Not on file  Social History Narrative   Not on file   Social Determinants of Health   Financial Resource Strain: High Risk (03/26/2022)   Overall Financial Resource Strain (CARDIA)    Difficulty of Paying Living Expenses: Hard  Food Insecurity: No Food Insecurity (09/29/2022)   Hunger Vital Sign    Worried About Running Out of Food in the Last Year: Never true    Ran Out of Food in the Last Year: Never true  Recent Concern: Food Insecurity - Food  Insecurity Present (07/01/2022)   Hunger Vital Sign    Worried About Running Out of Food in the Last Year: Often true    Ran Out of Food in the Last Year: Often true  Transportation Needs: No Transportation Needs (09/29/2022)   PRAPARE - Administrator, Civil Service (Medical): No    Lack of Transportation (Non-Medical): No  Recent Concern: Transportation Needs - Unmet Transportation Needs (07/01/2022)   PRAPARE - Transportation    Lack of Transportation (Medical): No    Lack of Transportation (Non-Medical): Yes  Physical Activity: Inactive (03/26/2022)   Exercise Vital Sign    Days of Exercise per Week: 0 days    Minutes of Exercise  per Session: 0 min  Stress: Stress Concern Present (03/26/2022)   [employer] of Occupational Health - Occupational Stress Questionnaire    Feeling of Stress : Very much  Social Connections: Socially Isolated (03/26/2022)   Social Connection and Isolation Panel [NHANES]    Frequency of Communication with Friends and Family: Twice a week    Frequency of Social Gatherings with Friends and Family: Once a week    Attends Religious Services: Never    Database administrator or Organizations: No    Attends Engineer, structural: Never    Marital Status: Divorced   Additional Social History: homeless at this time   Sleep: Good  Appetite:  Good  Current Medications: Current Facility-Administered Medications  Medication Dose Route Frequency Provider Last Rate Last Admin   acetaminophen (TYLENOL) tablet 650 mg  650 mg Oral Q4H PRN Oneta Rack, NP       albuterol (VENTOLIN HFA) 108 (90 Base) MCG/ACT inhaler 2 puff  2 puff Inhalation Q4H PRN Oneta Rack, NP       alum & mag hydroxide-simeth (MAALOX/MYLANTA) 200-200-20 MG/5ML suspension 30 mL  30 mL Oral Q4H PRN Oneta Rack, NP       diphenhydrAMINE (BENADRYL) capsule 50 mg  50 mg Oral TID PRN Oneta Rack, NP       Or   diphenhydrAMINE (BENADRYL) injection 50 mg  50 mg Intramuscular TID PRN Oneta Rack, NP       divalproex (DEPAKOTE) DR tablet 500 mg  500 mg Oral Q12H Charm Rings, NP   500 mg at 10/01/22 0934   haloperidol (HALDOL) tablet 5 mg  5 mg Oral TID PRN Oneta Rack, NP       Or   haloperidol lactate (HALDOL) injection 5 mg  5 mg Intramuscular TID PRN Oneta Rack, NP       hydrOXYzine (ATARAX) tablet 10 mg  10 mg Oral TID PRN Oneta Rack, NP       LORazepam (ATIVAN) tablet 2 mg  2 mg Oral TID PRN Oneta Rack, NP       Or   LORazepam (ATIVAN) injection 2 mg  2 mg Intramuscular TID PRN Oneta Rack, NP       magnesium hydroxide (MILK OF MAGNESIA) suspension 30 mL  30 mL Oral Daily PRN  Oneta Rack, NP       mometasone-formoterol (DULERA) 100-5 MCG/ACT inhaler 2 puff  2 puff Inhalation BID Oneta Rack, NP   2 puff at 10/01/22 0934   nicotine (NICODERM CQ - dosed in mg/24 hours) patch 14 mg  14 mg Transdermal Daily Sarina Ill, DO   14 mg at 09/30/22 0905   ondansetron (ZOFRAN) tablet 4 mg  4  mg Oral Q8H PRN Oneta Rack, NP       paliperidone (INVEGA) 24 hr tablet 3 mg  3 mg Oral Daily Charm Rings, NP   3 mg at 10/01/22 0934   pantoprazole (PROTONIX) EC tablet 40 mg  40 mg Oral Daily Oneta Rack, NP   40 mg at 10/01/22 0934   pravastatin (PRAVACHOL) tablet 40 mg  40 mg Oral QPM Oneta Rack, NP   40 mg at 09/30/22 1741    Lab Results:  Results for orders placed or performed during the hospital encounter of 09/28/22 (from the past 48 hour(s))  Lipid panel     Status: None   Collection Time: 10/01/22  7:10 AM  Result Value Ref Range   Cholesterol 163 0 - 200 mg/dL   Triglycerides 96 <253 mg/dL   HDL 51 >66 mg/dL   Total CHOL/HDL Ratio 3.2 RATIO   VLDL 19 0 - 40 mg/dL   LDL Cholesterol 93 0 - 99 mg/dL    Comment:        Total Cholesterol/HDL:CHD Risk Coronary Heart Disease Risk Table                     Men   Women  1/2 Average Risk   3.4   3.3  Average Risk       5.0   4.4  2 X Average Risk   9.6   7.1  3 X Average Risk  23.4   11.0        Use the calculated Patient Ratio above and the CHD Risk Table to determine the patient's CHD Risk.        ATP III CLASSIFICATION (LDL):  <100     mg/dL   Optimal  440-347  mg/dL   Near or Above                    Optimal  130-159  mg/dL   Borderline  425-956  mg/dL   High  >387     mg/dL   Very High Performed at [facility], [address] [place], [place] [address]   TSH     Status: None   Collection Time: 10/01/22  7:10 AM  Result Value Ref Range   TSH 1.380 0.350 - 4.500 uIU/mL    Comment: Performed by a 3rd Generation assay with a functional sensitivity of <=0.01  uIU/mL. Performed at [facility], [address] [place], [place] [address]     Blood Alcohol level:  Lab Results  Component Value Date   [facility] <10 09/27/2022   ETH <10 11/24/2017    Metabolic Disorder Labs: Lab Results  Component Value Date   HGBA1C 5.4 07/01/2022   MPG 102.54 11/30/2017   Lab Results  Component Value Date   PROLACTIN 12.4 11/30/2017   Lab Results  Component Value Date   CHOL 163 10/01/2022   TRIG 96 10/01/2022   HDL 51 10/01/2022   CHOLHDL 3.2 10/01/2022   VLDL 19 10/01/2022   LDLCALC 93 10/01/2022   LDLCALC 86 04/04/2022     Musculoskeletal: Strength & Muscle Tone: within normal limits Gait & Station: normal Patient leans: N/A  Psychiatric Specialty Exam: Physical Exam Vitals reviewed.  Constitutional:      Appearance: Normal appearance.  HENT:     Head: Normocephalic.     Nose: Nose normal.  Pulmonary:     Effort: Pulmonary effort is normal.  Musculoskeletal:  General: Normal range of motion.     Cervical back: Normal range of motion.  Neurological:     General: No focal deficit present.     Mental Status: She is alert and oriented to person, place, and time.  Psychiatric:        Attention and Perception: Attention and perception normal.        Mood and Affect: Mood is anxious and depressed.        Speech: Speech normal.        Behavior: Behavior normal. Behavior is cooperative.        Thought Content: Thought content normal.        Cognition and Memory: Cognition and memory normal.        Judgment: Judgment is impulsive.     Review of Systems  Psychiatric/Behavioral:  Positive for depression. The patient is nervous/anxious.   All other systems reviewed and are negative.   Blood pressure 103/62, pulse 64, temperature (!) 97.1 F (36.2 C), resp. rate 20, height 5\' 1"  (1.549 m), weight 61.2 kg, SpO2 98%.Body mass index is 25.51 kg/m.  General Appearance: Casual  Eye Contact:  Fair  Speech:  Normal Rate   Volume:  Normal  Mood:  anxious and depression  Affect:  Congruent  Thought Process:  Coherent  Orientation:  Full (Time, Place, and Person)  Thought Content:  Rumination  Suicidal Thoughts:  No  Homicidal Thoughts:  No  Memory:  Immediate;   Fair Recent;   Fair Remote;   Fair  Judgement:  Poor  Insight:  Lacking  Psychomotor Activity:  Normal  Concentration:  Concentration: Fair and Attention Span: Fair  Recall:  [employer] of Knowledge:  Fair  Language:  Good  Akathisia:  No  Handed:  Right  AIMS (if indicated):     Assets:  Leisure Time Physical Health Resilience Social Support  ADL's:  Intact  Cognition:  WNL  Sleep:         Physical Exam: Physical Exam Vitals reviewed.  Constitutional:      Appearance: Normal appearance.  HENT:     Head: Normocephalic.     Nose: Nose normal.  Pulmonary:     Effort: Pulmonary effort is normal.  Musculoskeletal:        General: Normal range of motion.     Cervical back: Normal range of motion.  Neurological:     General: No focal deficit present.     Mental Status: She is alert and oriented to person, place, and time.  Psychiatric:        Attention and Perception: Attention and perception normal.        Mood and Affect: Mood is anxious and depressed.        Speech: Speech normal.        Behavior: Behavior normal. Behavior is cooperative.        Thought Content: Thought content normal.        Cognition and Memory: Cognition and memory normal.        Judgment: Judgment is impulsive.    Review of Systems  Psychiatric/Behavioral:  Positive for depression. The patient is nervous/anxious.   All other systems reviewed and are negative.  Blood pressure 103/62, pulse 64, temperature (!) 97.1 F (36.2 C), resp. rate 20, height 5\' 1"  (1.549 m), weight 61.2 kg, SpO2 98%. Body mass index is 25.51 kg/m.   Treatment Plan Summary: Daily contact with patient to assess and evaluate symptoms and progress in treatment, Medication  management, and Plan : Bipolar affective disorder, depressed, severe: Lamictal 200 mg BID was discontinued per her psych provider per her POA, R[place] 6 mg BID that she recently started, pharmacy does not carry in the pharmacy here and her brother reported it is not helping her and she is incontinent on it, discontinued Invega 3 mg started instead per pharmacy recommendations as the closest alternative with less side effects. Depakote 500 mg BID Lipid panel WDL, TSH WDL, and A1C & EKG awaiting results   Anxiety: Hydroxyzine 10 mg TID PRN   Nanine Means, NP 10/01/2022, 4:41 PM

## 2022-10-01 NOTE — Group Note (Signed)
Recreation Therapy Group Note   Group Topic:Problem Solving  Group Date: 10/01/2022 Start Time: 1000 End Time: 1100 Facilitators: Rosina Lowenstein, LRT, CTRS Location:  Day room  Group Description: Life Boat. Patients were given the scenario that they are on a boat that is about to become shipwrecked, leaving them stranded on an [place]. They are asked to make a list of 15 different items that they want to take with them when they are stranded on the [place]. Patients are asked to rank their items from most important to least important, #1 being the most important and #15 being the least. Patients will work individually for the first round to come up with 15 items and then pair up with a peer(s) to condense their list and come up with one list of 15 items between the two of them. Patients or LRT will read aloud the 15 different items to the group after each round. LRT facilitated post-activity processing to discuss how this activity can be used in daily life post discharge.   Goal Area(s) Addressed:  Patient will identify priorities, wants and needs. Patient will communicate with LRT and peers. Patient will work collectively as a Administrator, Civil Service. Patient will work on Product manager.  Patient will identify the importance of communication.   Affect/Mood: Appropriate   Participation Level: Active and Engaged   Participation Quality: Independent   Behavior: Calm and Cooperative   Speech/Thought Process: Coherent   Insight: Good   Judgement: Good   Modes of Intervention: Activity   Patient Response to Interventions:  Attentive, Engaged, Interested , and Receptive   Education Outcome:  Acknowledges education   Clinical Observations/Individualized Feedback: Syvannah was active in their participation of session activities and group discussion. Pt identified "food, backpack, shampoo, shoes, first aid kit, books, and hat" as some of the items she will bring with her on the [place]. Pt  interacted well with LRT and peers duration of session.   Plan: Continue to engage patient in RT group sessions 2-3x/week.   Rosina Lowenstein, LRT, CTRS 10/01/2022 11:44 AM

## 2022-10-01 NOTE — Plan of Care (Signed)
Problem: Education: Goal: Knowledge of General Education information will improve Description: Including pain rating scale, medication(s)/side effects and non-pharmacologic comfort measures Outcome: Not Progressing   Problem: Health Behavior/Discharge Planning: Goal: Ability to manage health-related needs will improve Outcome: Not Progressing   Problem: Clinical Measurements: Goal: Ability to maintain clinical measurements within normal limits will improve Outcome: Not Progressing Goal: Will remain free from infection Outcome: Not Progressing Goal: Diagnostic test results will improve Outcome: Not Progressing Goal: Respiratory complications will improve Outcome: Not Progressing Goal: Cardiovascular complication will be avoided Outcome: Not Progressing   Problem: Activity: Goal: Risk for activity intolerance will decrease Outcome: Not Progressing   Problem: Nutrition: Goal: Adequate nutrition will be maintained Outcome: Not Progressing   Problem: Coping: Goal: Level of anxiety will decrease Outcome: Not Progressing   Problem: Elimination: Goal: Will not experience complications related to bowel motility Outcome: Not Progressing Goal: Will not experience complications related to urinary retention Outcome: Not Progressing   Problem: Pain Managment: Goal: General experience of comfort will improve Outcome: Not Progressing   Problem: Safety: Goal: Ability to remain free from injury will improve Outcome: Not Progressing   Problem: Skin Integrity: Goal: Risk for impaired skin integrity will decrease Outcome: Not Progressing   Problem: Education: Goal: Knowledge of [place] General Education information/materials will improve Outcome: Not Progressing Goal: Emotional status will improve Outcome: Not Progressing Goal: Mental status will improve Outcome: Not Progressing Goal: Verbalization of understanding the information provided will improve Outcome: Not  Progressing   Problem: Activity: Goal: Interest or engagement in activities will improve Outcome: Not Progressing Goal: Sleeping patterns will improve Outcome: Not Progressing   Problem: Coping: Goal: Ability to verbalize frustrations and anger appropriately will improve Outcome: Not Progressing Goal: Ability to demonstrate self-control will improve Outcome: Not Progressing   Problem: Health Behavior/Discharge Planning: Goal: Identification of resources available to assist in meeting health care needs will improve Outcome: Not Progressing Goal: Compliance with treatment plan for underlying cause of condition will improve Outcome: Not Progressing   Problem: Physical Regulation: Goal: Ability to maintain clinical measurements within normal limits will improve Outcome: Not Progressing   Problem: Safety: Goal: Periods of time without injury will increase Outcome: Not Progressing   Problem: Education: Goal: Ability to make informed decisions regarding treatment will improve Outcome: Not Progressing   Problem: Coping: Goal: Coping ability will improve Outcome: Not Progressing   Problem: Health Behavior/Discharge Planning: Goal: Identification of resources available to assist in meeting health care needs will improve Outcome: Not Progressing   Problem: Medication: Goal: Compliance with prescribed medication regimen will improve Outcome: Not Progressing   Problem: Self-Concept: Goal: Ability to disclose and discuss suicidal ideas will improve Outcome: Not Progressing Goal: Will verbalize positive feelings about self Outcome: Not Progressing   Problem: Education: Goal: Knowledge of [place] General Education information/materials will improve Outcome: Not Progressing Goal: Emotional status will improve Outcome: Not Progressing Goal: Mental status will improve Outcome: Not Progressing Goal: Verbalization of understanding the information provided will  improve Outcome: Not Progressing   Problem: Activity: Goal: Interest or engagement in activities will improve Outcome: Not Progressing Goal: Sleeping patterns will improve Outcome: Not Progressing   Problem: Coping: Goal: Ability to verbalize frustrations and anger appropriately will improve Outcome: Not Progressing Goal: Ability to demonstrate self-control will improve Outcome: Not Progressing   Problem: Health Behavior/Discharge Planning: Goal: Identification of resources available to assist in meeting health care needs will improve Outcome: Not Progressing Goal: Compliance with treatment plan for underlying cause of  condition will improve Outcome: Not Progressing   Problem: Physical Regulation: Goal: Ability to maintain clinical measurements within normal limits will improve Outcome: Not Progressing   Problem: Safety: Goal: Periods of time without injury will increase Outcome: Not Progressing

## 2022-10-01 NOTE — Group Note (Signed)
Date:  10/01/2022 Time:  8:29 PM  Group Topic/Focus:  Dimensions of Wellness:   The focus of this group is to introduce the topic of wellness and discuss the role each dimension of wellness plays in total health.    Participation Level:  Did Not Attend  Participation Quality:   none  Affect:   none  Cognitive:   none  Insight: None  Engagement in Group:   none  Modes of Intervention:  Activity and Discussion  Additional Comments:    Osker Mason 10/01/2022, 8:29 PM

## 2022-10-01 NOTE — Group Note (Signed)
Date:  10/01/2022 Time:  10:14 AM  Group Topic/Focus:  Goals Group:   The focus of this group is to help patients establish daily goals to achieve during treatment and discuss how the patient can incorporate goal setting into their daily lives to aide in recovery.    Participation Level:  Active  Participation Quality:  Appropriate  Affect:  Appropriate  Cognitive:  Appropriate  Insight: Appropriate  Engagement in Group:  Engaged  Modes of Intervention:  Discussion, Education, and Support  Additional Comments:    Chloe [name] 10/01/2022, 10:14 AM

## 2022-10-01 NOTE — Progress Notes (Signed)
D- Patient alert and oriented x 3-4. Affect flat and worried/mood irritable. Denies SI/ HI/ AVH. Patient denies pain. Patient endorses depression and anxiety. States her goal today to sleep and stay calm. She complains of urinary incontinence and irritated in groin area. Vitamin A & D ointment ordered by NP. A- Scheduled medications administered to patient, per MD orders. Support and encouragement provided.  Routine safety checks conducted every 15 minutes without incident.  Patient informed to notify staff with problems or concerns and verbalizes understanding. R- No adverse drug reactions noted.  Patient compliant with medications and treatment plan. Patient receptive, calm cooperative and interacts well with others on the unit.  Patient contracts for safety and  remains safe on the unit at this time.

## 2022-10-02 DIAGNOSIS — F315 Bipolar disorder, current episode depressed, severe, with psychotic features: Secondary | ICD-10-CM | POA: Diagnosis not present

## 2022-10-02 NOTE — Progress Notes (Signed)
Patient presents with sad, flat affect. Reports being anxious, requested prn. Prn given, waiting effectiveness. Denies SI, HI, AVH. Isolative to self and room. Forwards minimal.  Encouragement and support provided. Safety checks maintained. Medicafions given as prescribed. Pt receptive and remains safe on unit with q 15 min checks.

## 2022-10-02 NOTE — Plan of Care (Signed)
  Problem: Education: Goal: Knowledge of General Education information will improve Description: Including pain rating scale, medication(s)/side effects and non-pharmacologic comfort measures Outcome: Progressing   Problem: Nutrition: Goal: Adequate nutrition will be maintained Outcome: Progressing   Problem: Coping: Goal: Level of anxiety will decrease Outcome: Not Progressing   Problem: Safety: Goal: Ability to remain free from injury will improve Outcome: Progressing

## 2022-10-02 NOTE — Progress Notes (Signed)
[facility] MD Progress Note  10/02/2022 8:49 PM Chloe [name]  MRN:  [id number]  Subjective:   57 yo female presented with aggressive behaviors after not taking her medications, history of bipolar d/o.  Notes, labs, vital signs, and discussion in progression meeting reviewed prior to her assessment. She reported depression and depression on a low level related to "not being able tor return to where I was," living with her brother and sister-in-law.  Unfortunately, she has no insight that her behaviors are what prevented her return.  Today, she blamed it on having a URI and GI/GU issues.  She is calmer and can have a discussion without getting upset with this provider or others on the unit.  Sleep and appetite are "good".  Denies side effects from her medications.  Principal Problem: Bipolar affective disorder, depressed, severe, with psychotic behavior (HCC) Diagnosis: Principal Problem:   Bipolar affective disorder, depressed, severe, with psychotic behavior (HCC)  Total Time spent with patient: 30 minutes  Past Psychiatric History: bipolar d/o  Past Medical History:  Past Medical History:  Diagnosis Date   Anxiety    Bipolar disorder (HCC)    Breast mass, right    GERD (gastroesophageal reflux disease)    Hyperlipidemia    Post-operative nausea and vomiting    Smoker     Past Surgical History:  Procedure Laterality Date   ABDOMINAL HYSTERECTOMY     had nausea and vomiting post-op   BREAST LUMPECTOMY WITH RADIOACTIVE SEED LOCALIZATION Right 11/12/2016   Procedure: RIGHT BREAST LUMPECTOMY WITH RADIOACTIVE SEED LOCALIZATION;  Surgeon: Harriette Bouillon, MD;  Location: [facility];  Service: General;  Laterality: Right;   CHOLECYSTECTOMY     LAPAROSCOPIC LYSIS OF ADHESIONS     MYOMECTOMY     PLANTAR FASCIA SURGERY Left    Family History:  Family History  Problem Relation Age of Onset   Lung cancer Mother    Breast cancer Maternal Aunt    Hyperlipidemia Brother    Colon  cancer Neg Hx    Esophageal cancer Neg Hx    Stomach cancer Neg Hx    Family Psychiatric  History: none Social History:  Social History   Substance and Sexual Activity  Alcohol Use Not Currently   Comment: social     Social History   Substance and Sexual Activity  Drug Use No    Social History   Socioeconomic History   Marital status: Divorced    Spouse name: Not on file   Number of children: Not on file   Years of education: Not on file   Highest education level: Associate degree: occupational, Scientist, product/process development, or vocational program  Occupational History   Not on file  Tobacco Use   Smoking status: Every Day    Current packs/day: 0.50    Types: Cigarettes   Smokeless tobacco: Never  Vaping Use   Vaping status: Never Used  Substance and Sexual Activity   Alcohol use: Not Currently    Comment: social   Drug use: No   Sexual activity: Not Currently  Other Topics Concern   Not on file  Social History Narrative   Not on file   Social Determinants of Health   Financial Resource Strain: High Risk (03/26/2022)   Overall Financial Resource Strain (CARDIA)    Difficulty of Paying Living Expenses: Hard  Food Insecurity: No Food Insecurity (09/29/2022)   Hunger Vital Sign    Worried About Running Out of Food in the Last Year: Never true  Ran Out of Food in the Last Year: Never true  Recent Concern: Food Insecurity - Food Insecurity Present (07/01/2022)   Hunger Vital Sign    Worried About Running Out of Food in the Last Year: Often true    Ran Out of Food in the Last Year: Often true  Transportation Needs: No Transportation Needs (09/29/2022)   PRAPARE - Administrator, Civil Service (Medical): No    Lack of Transportation (Non-Medical): No  Recent Concern: Transportation Needs - Unmet Transportation Needs (07/01/2022)   PRAPARE - Transportation    Lack of Transportation (Medical): No    Lack of Transportation (Non-Medical): Yes  Physical Activity: Inactive  (03/26/2022)   Exercise Vital Sign    Days of Exercise per Week: 0 days    Minutes of Exercise per Session: 0 min  Stress: Stress Concern Present (03/26/2022)   [employer] of Occupational Health - Occupational Stress Questionnaire    Feeling of Stress : Very much  Social Connections: Socially Isolated (03/26/2022)   Social Connection and Isolation Panel [NHANES]    Frequency of Communication with Friends and Family: Twice a week    Frequency of Social Gatherings with Friends and Family: Once a week    Attends Religious Services: Never    Database administrator or Organizations: No    Attends Engineer, structural: Never    Marital Status: Divorced   Additional Social History: homeless at this time   Sleep: Good  Appetite:  Good  Current Medications: Current Facility-Administered Medications  Medication Dose Route Frequency Provider Last Rate Last Admin   acetaminophen (TYLENOL) tablet 650 mg  650 mg Oral Q4H PRN Oneta Rack, NP       albuterol (VENTOLIN HFA) 108 (90 Base) MCG/ACT inhaler 2 puff  2 puff Inhalation Q4H PRN Oneta Rack, NP       alum & mag hydroxide-simeth (MAALOX/MYLANTA) 200-200-20 MG/5ML suspension 30 mL  30 mL Oral Q4H PRN Oneta Rack, NP       diphenhydrAMINE (BENADRYL) capsule 50 mg  50 mg Oral TID PRN Oneta Rack, NP       Or   diphenhydrAMINE (BENADRYL) injection 50 mg  50 mg Intramuscular TID PRN Oneta Rack, NP       divalproex (DEPAKOTE) DR tablet 500 mg  500 mg Oral Q12H Charm Rings, NP   500 mg at 10/02/22 0854   haloperidol (HALDOL) tablet 5 mg  5 mg Oral TID PRN Oneta Rack, NP       Or   haloperidol lactate (HALDOL) injection 5 mg  5 mg Intramuscular TID PRN Oneta Rack, NP       hydrOXYzine (ATARAX) tablet 10 mg  10 mg Oral TID PRN Oneta Rack, NP       LORazepam (ATIVAN) tablet 2 mg  2 mg Oral TID PRN Oneta Rack, NP       Or   LORazepam (ATIVAN) injection 2 mg  2 mg Intramuscular TID PRN Oneta Rack, NP       magnesium hydroxide (MILK OF MAGNESIA) suspension 30 mL  30 mL Oral Daily PRN Oneta Rack, NP       mometasone-formoterol (DULERA) 100-5 MCG/ACT inhaler 2 puff  2 puff Inhalation BID Oneta Rack, NP   2 puff at 10/02/22 0854   nicotine (NICODERM CQ - dosed in mg/24 hours) patch 14 mg  14 mg Transdermal Daily Sarina Ill,  DO   14 mg at 09/30/22 0905   ondansetron (ZOFRAN) tablet 4 mg  4 mg Oral Q8H PRN Oneta Rack, NP       paliperidone (INVEGA) 24 hr tablet 3 mg  3 mg Oral Daily Charm Rings, NP   3 mg at 10/02/22 0853   pantoprazole (PROTONIX) EC tablet 40 mg  40 mg Oral Daily Oneta Rack, NP   40 mg at 10/02/22 0854   pravastatin (PRAVACHOL) tablet 40 mg  40 mg Oral QPM Oneta Rack, NP   40 mg at 10/02/22 1750   vitamin A & D ointment   Topical PRN Charm Rings, NP   1 Application at 10/02/22 1638    Lab Results:  Results for orders placed or performed during the hospital encounter of 09/28/22 (from the past 48 hour(s))  Lipid panel     Status: None   Collection Time: 10/01/22  7:10 AM  Result Value Ref Range   Cholesterol 163 0 - 200 mg/dL   Triglycerides 96 <932 mg/dL   HDL 51 >35 mg/dL   Total CHOL/HDL Ratio 3.2 RATIO   VLDL 19 0 - 40 mg/dL   LDL Cholesterol 93 0 - 99 mg/dL    Comment:        Total Cholesterol/HDL:CHD Risk Coronary Heart Disease Risk Table                     Men   Women  1/2 Average Risk   3.4   3.3  Average Risk       5.0   4.4  2 X Average Risk   9.6   7.1  3 X Average Risk  23.4   11.0        Use the calculated Patient Ratio above and the CHD Risk Table to determine the patient's CHD Risk.        ATP III CLASSIFICATION (LDL):  <100     mg/dL   Optimal  573-220  mg/dL   Near or Above                    Optimal  130-159  mg/dL   Borderline  254-270  mg/dL   High  >623     mg/dL   Very High Performed at [facility], [address] [place], [place] [address]   TSH     Status: None    Collection Time: 10/01/22  7:10 AM  Result Value Ref Range   TSH 1.380 0.350 - 4.500 uIU/mL    Comment: Performed by a 3rd Generation assay with a functional sensitivity of <=0.01 uIU/mL. Performed at [facility], [address] [place], [place] [address]   Hemoglobin A1c     Status: None   Collection Time: 10/01/22  7:10 AM  Result Value Ref Range   Hgb A1c MFr Bld 5.4 4.8 - 5.6 %    Comment: (NOTE)         Prediabetes: 5.7 - 6.4         Diabetes: >6.4         Glycemic control for adults with diabetes: <7.0    Mean Plasma Glucose 108 mg/dL    Comment: (NOTE) Performed At: [facility] [address] [place], [place] [id number] Jolene Schimke MD YI:[id number]     Blood Alcohol level:  Lab Results  Component Value Date   ETH <10 09/27/2022   ETH <10 11/24/2017  Metabolic Disorder Labs: Lab Results  Component Value Date   HGBA1C 5.4 10/01/2022   MPG 108 10/01/2022   MPG 102.54 11/30/2017   Lab Results  Component Value Date   PROLACTIN 12.4 11/30/2017   Lab Results  Component Value Date   CHOL 163 10/01/2022   TRIG 96 10/01/2022   HDL 51 10/01/2022   CHOLHDL 3.2 10/01/2022   VLDL 19 10/01/2022   LDLCALC 93 10/01/2022   LDLCALC 86 04/04/2022     Musculoskeletal: Strength & Muscle Tone: within normal limits Gait & Station: normal Patient leans: N/A  Psychiatric Specialty Exam: Physical Exam Vitals reviewed.  Constitutional:      Appearance: Normal appearance.  HENT:     Head: Normocephalic.     Nose: Nose normal.  Pulmonary:     Effort: Pulmonary effort is normal.  Musculoskeletal:        General: Normal range of motion.     Cervical back: Normal range of motion.  Neurological:     General: No focal deficit present.     Mental Status: She is alert and oriented to person, place, and time.  Psychiatric:        Attention and Perception: Attention and perception normal.        Mood and Affect: Mood is anxious and depressed.         Speech: Speech normal.        Behavior: Behavior normal. Behavior is cooperative.        Thought Content: Thought content normal.        Cognition and Memory: Cognition and memory normal.        Judgment: Judgment is impulsive.     Review of Systems  Psychiatric/Behavioral:  Positive for depression and dysphoric mood. The patient is nervous/anxious.   All other systems reviewed and are negative.   Blood pressure (!) 115/44, pulse 73, temperature (!) 97.2 F (36.2 C), resp. rate (!) 21, height 5\' 1"  (1.549 m), weight 61.2 kg, SpO2 100%.Body mass index is 25.51 kg/m.  General Appearance: Casual  Eye Contact:  Fair  Speech:  Normal Rate  Volume:  Normal  Mood:  anxious and depression  Affect:  Congruent  Thought Process:  Coherent  Orientation:  Full (Time, Place, and Person)  Thought Content:  Rumination  Suicidal Thoughts:  No  Homicidal Thoughts:  No  Memory:  Immediate;   Fair Recent;   Fair Remote;   Fair  Judgement:  Poor  Insight:  Lacking  Psychomotor Activity:  Normal  Concentration:  Concentration: Fair and Attention Span: Fair  Recall:  [employer] of Knowledge:  Fair  Language:  Good  Akathisia:  No  Handed:  Right  AIMS (if indicated):     Assets:  Leisure Time Physical Health Resilience Social Support  ADL's:  Intact  Cognition:  WNL  Sleep:         Physical Exam: Physical Exam Vitals reviewed.  Constitutional:      Appearance: Normal appearance.  HENT:     Head: Normocephalic.     Nose: Nose normal.  Pulmonary:     Effort: Pulmonary effort is normal.  Musculoskeletal:        General: Normal range of motion.     Cervical back: Normal range of motion.  Neurological:     General: No focal deficit present.     Mental Status: She is alert and oriented to person, place, and time.  Psychiatric:  Attention and Perception: Attention and perception normal.        Mood and Affect: Mood is anxious and depressed.        Speech: Speech  normal.        Behavior: Behavior normal. Behavior is cooperative.        Thought Content: Thought content normal.        Cognition and Memory: Cognition and memory normal.        Judgment: Judgment is impulsive.    Review of Systems  Psychiatric/Behavioral:  Positive for depression and dysphoric mood. The patient is nervous/anxious.   All other systems reviewed and are negative.  Blood pressure (!) 115/44, pulse 73, temperature (!) 97.2 F (36.2 C), resp. rate (!) 21, height 5\' 1"  (1.549 m), weight 61.2 kg, SpO2 100%. Body mass index is 25.51 kg/m.   Treatment Plan Summary: Daily contact with patient to assess and evaluate symptoms and progress in treatment, Medication management, and Plan : Bipolar affective disorder, depressed, severe: Depakote 500 mg BID Invega  3 mg daily Lipid panel WDL, TSH WDL, and A1C WDL & EKG awaiting results   Anxiety: Hydroxyzine 10 mg TID PRN   Nanine Means, NP 10/02/2022, 8:49 PM

## 2022-10-02 NOTE — Plan of Care (Signed)
Problem: Education: Goal: Knowledge of General Education information will improve Description: Including pain rating scale, medication(s)/side effects and non-pharmacologic comfort measures Outcome: Not Progressing   Problem: Health Behavior/Discharge Planning: Goal: Ability to manage health-related needs will improve Outcome: Not Progressing   Problem: Clinical Measurements: Goal: Ability to maintain clinical measurements within normal limits will improve Outcome: Not Progressing Goal: Will remain free from infection Outcome: Not Progressing Goal: Diagnostic test results will improve Outcome: Not Progressing Goal: Respiratory complications will improve Outcome: Not Progressing Goal: Cardiovascular complication will be avoided Outcome: Not Progressing   Problem: Activity: Goal: Risk for activity intolerance will decrease Outcome: Not Progressing   Problem: Nutrition: Goal: Adequate nutrition will be maintained Outcome: Not Progressing   Problem: Coping: Goal: Level of anxiety will decrease Outcome: Not Progressing   Problem: Elimination: Goal: Will not experience complications related to bowel motility Outcome: Not Progressing Goal: Will not experience complications related to urinary retention Outcome: Not Progressing   Problem: Pain Managment: Goal: General experience of comfort will improve Outcome: Not Progressing   Problem: Safety: Goal: Ability to remain free from injury will improve Outcome: Not Progressing   Problem: Skin Integrity: Goal: Risk for impaired skin integrity will decrease Outcome: Not Progressing   Problem: Education: Goal: Knowledge of [place] [facility] Education information/materials will improve Outcome: Not Progressing Goal: Emotional status will improve Outcome: Not Progressing Goal: Mental status will improve Outcome: Not Progressing Goal: Verbalization of understanding the information provided will improve Outcome: Not  Progressing   Problem: Activity: Goal: Interest or engagement in activities will improve Outcome: Not Progressing Goal: Sleeping patterns will improve Outcome: Not Progressing   Problem: Coping: Goal: Ability to verbalize frustrations and anger appropriately will improve Outcome: Not Progressing Goal: Ability to demonstrate self-control will improve Outcome: Not Progressing   Problem: Health Behavior/Discharge Planning: Goal: Identification of resources available to assist in meeting health care needs will improve Outcome: Not Progressing Goal: Compliance with treatment plan for underlying cause of condition will improve Outcome: Not Progressing   Problem: Physical Regulation: Goal: Ability to maintain clinical measurements within normal limits will improve Outcome: Not Progressing   Problem: Safety: Goal: Periods of time without injury will increase Outcome: Not Progressing   Problem: Education: Goal: Ability to make informed decisions regarding treatment will improve Outcome: Not Progressing   Problem: Coping: Goal: Coping ability will improve Outcome: Not Progressing   Problem: Health Behavior/Discharge Planning: Goal: Identification of resources available to assist in meeting health care needs will improve Outcome: Not Progressing   Problem: Medication: Goal: Compliance with prescribed medication regimen will improve Outcome: Not Progressing   Problem: Self-Concept: Goal: Ability to disclose and discuss suicidal ideas will improve Outcome: Not Progressing Goal: Will verbalize positive feelings about self Outcome: Not Progressing   Problem: Education: Goal: Knowledge of [place] [facility] Education information/materials will improve Outcome: Not Progressing Goal: Emotional status will improve Outcome: Not Progressing Goal: Mental status will improve Outcome: Not Progressing Goal: Verbalization of understanding the information provided will  improve Outcome: Not Progressing   Problem: Activity: Goal: Interest or engagement in activities will improve Outcome: Not Progressing Goal: Sleeping patterns will improve Outcome: Not Progressing   Problem: Coping: Goal: Ability to verbalize frustrations and anger appropriately will improve Outcome: Not Progressing Goal: Ability to demonstrate self-control will improve Outcome: Not Progressing   Problem: Health Behavior/Discharge Planning: Goal: Identification of resources available to assist in meeting health care needs will improve Outcome: Not Progressing Goal: Compliance with treatment plan for underlying cause of  condition will improve Outcome: Not Progressing   Problem: Physical Regulation: Goal: Ability to maintain clinical measurements within normal limits will improve Outcome: Not Progressing   Problem: Safety: Goal: Periods of time without injury will increase Outcome: Not Progressing

## 2022-10-02 NOTE — Group Note (Signed)
Date:  10/03/2022 Time:  12:02 AM  Group Topic/Focus:  Personal Choices and Values:   The focus of this group is to help patients assess and explore the importance of values in their lives, how their values affect their decisions, how they express their values and what opposes their expression.    Participation Level:  Active  Participation Quality:  Appropriate  Affect:  Appropriate  Cognitive:  Appropriate  Insight: Appropriate  Engagement in Group:  Engaged  Modes of Intervention:  Discussion  Additional Comments:    Lenore Cordia 10/03/2022, 12:02 AM

## 2022-10-02 NOTE — Group Note (Signed)
Date:  10/02/2022 Time:  1:32 PM  Group Topic/Focus:  Goals Group:   The focus of this group is to help patients establish daily goals to achieve during treatment and discuss how the patient can incorporate goal setting into their daily lives to aide in recovery.  Community Group   Participation Level:  Active  Participation Quality:  Appropriate  Affect:  Appropriate  Cognitive:  Appropriate  Insight: Appropriate  Engagement in Group:  Engaged  Modes of Intervention:  Discussion and Education  Additional Comments:    Nisaiah Bechtol A Sonam Huelsmann 10/02/2022, 1:32 PM

## 2022-10-02 NOTE — Group Note (Unsigned)
Date:  10/02/2022 Time:  10:59 PM  Group Topic/Focus:  Personal Choices and Values:   The focus of this group is to help patients assess and explore the importance of values in their lives, how their values affect their decisions, how they express their values and what opposes their expression.     Participation Level:  {BHH PARTICIPATION WUJWJ:19147}  Participation Quality:  {BHH PARTICIPATION QUALITY:22265}  Affect:  {BHH AFFECT:22266}  Cognitive:  {BHH COGNITIVE:22267}  Insight: {BHH Insight2:20797}  Engagement in Group:  {BHH ENGAGEMENT IN WGNFA:21308}  Modes of Intervention:  {BHH MODES OF INTERVENTION:22269}  Additional Comments:  ***  Lenore Cordia 10/02/2022, 10:59 PM

## 2022-10-02 NOTE — Progress Notes (Signed)
D- Patient alert and oriented x 2- 3. Affect anxious/mood congruent. Denies SI/ HI/ AVH. Patient denies pain. Patient endorses mild depression and anxiety. Her goal today is to be discharged and "find a job". She also to "keep to herself". Continues with urinary incontinence and A & D ointment ordered and given 1 application to patient to administer and apply. She was instructed on how to use and peri care A- Scheduled medications administered to patient, per MD orders. Support and encouragement provided.  Routine safety checks conducted every 15 minutes without incident.  Patient informed to notify staff with problems or concerns and verbalizes understanding. R- No adverse drug reactions noted.  Patient compliant with medications and treatment plan. Patient receptive, calm cooperative and keeps to herself in the milieu. Patient contracts for safety and  remains safe on the unit at this time.

## 2022-10-02 NOTE — Group Note (Signed)
Recreation Therapy Group Note   Group Topic:Goal Setting  Group Date: 10/02/2022 Start Time: 1005 End Time: 1105 Facilitators: Rosina Lowenstein, LRT, CTRS Location:  Day room  Group Description: Vision Board. Patients were given many different magazines, a glue stick, markers, and a piece of cardstock paper. LRT and pts discussed the importance of having goals in life. LRT and pts discussed the difference between short-term and long-term goals, as well as what a SMART goal is. LRT encouraged pts to create a vision board, with images they picked and then cut out with safety scissors from the magazine, for themselves, that capture their short and long-term goals. LRT encouraged pts to show and explain their vision board to the group. LRT offered to laminate vision board once dry and complete.   Goal Area(s) Addressed:  Patient will gain knowledge of short vs. long term goals.  Patient will identify goals for themselves. Patient will practice setting SMART goals. Patient will verbalize their goals to LRT and peers.   Affect/Mood: Appropriate and Flat   Participation Level: Active and Engaged   Participation Quality: Independent   Behavior: Appropriate, Calm, and Cooperative   Speech/Thought Process: Coherent   Insight: Fair   Judgement: Fair    Modes of Intervention: Art   Patient Response to Interventions:  Receptive   Education Outcome:  Acknowledges education   Clinical Observations/Individualized Feedback: Jaide was active in their participation of session activities and group discussion. Pt identified "I got kind of lost in what I was doing. I like this picture of the dog and would love to have one." Pt interacted well with LRT and peers duration of session.   Plan: Continue to engage patient in RT group sessions 2-3x/week.   Rosina Lowenstein, LRT, CTRS 10/02/2022 11:58 AM

## 2022-10-03 DIAGNOSIS — F315 Bipolar disorder, current episode depressed, severe, with psychotic features: Secondary | ICD-10-CM | POA: Diagnosis not present

## 2022-10-03 NOTE — Plan of Care (Signed)
  Problem: Activity: Goal: Risk for activity intolerance will decrease Outcome: Progressing   Problem: Nutrition: Goal: Adequate nutrition will be maintained Outcome: Progressing   Problem: Coping: Goal: Level of anxiety will decrease Outcome: Progressing   

## 2022-10-03 NOTE — Group Note (Signed)
Date:  10/03/2022 Time:  4:15 PM  Group Topic/Focus:  Activity Group:  The focus of this group is to promote activity and wellness for patients to get some fresh air and also get some exercise.    Participation Level:  Did Not Attend   Chloe [name] 10/03/2022, 4:15 PM

## 2022-10-03 NOTE — Progress Notes (Signed)
Patient admitted involuntarily on September 28, 2022 for psychotic behavior and depressed mood with a diagnosis of bipolar affective disorder. She denies SI/HI/AVH. She is mainly isolative to her room but will appear for meals, groups, and snacks. Affect and mood depressed.  No distress noted or voiced.   Q15 minute unit checks in place.

## 2022-10-03 NOTE — Group Note (Signed)
Recreation Therapy Group Note   Group Topic:Healthy Support Systems  Group Date: 10/03/2022 Start Time: 1005 End Time: 1100 Facilitators: Rosina Lowenstein, LRT, CTRS Location:  Craft Room  Group Description: Straw Bridge.  Patients were given 10 plastic drinking straws and an equal length of masking tape. Using the materials provided, patients were instructed to build a free-standing bridge-like structure to suspend an everyday item (ex: deck of cards) off the floor or table surface. All materials were required to be used in Secondary school teacher. LRT facilitated post-activity discussion reviewing the importance of having strong and healthy support systems in our lives. LRT discussed how the people in our lives serve as the tape and the deck of cards we placed on top of our straw structure are the stressors we face in daily life. LRT and pts discussed what happens in our life when things get too heavy for [place], and we don't have strong supports outside of the hospital. Pt shared 2 of their healthy supports aloud in the group.    Goal Area(s) Addressed:  Patient will identify 2 healthy supports in their life. Patient will identify skills to successfully complete activity. Patient will identify correlation of this activity to life post-discharge.  Patient will work on Product manager.   Affect/Mood: Appropriate and Flat   Participation Level: Active and Engaged   Participation Quality: Independent   Behavior: Appropriate, Calm, and Cooperative   Speech/Thought Process: Coherent   Insight: Fair   Judgement: Good   Modes of Intervention: Activity   Patient Response to Interventions:  Attentive and Receptive   Education Outcome:  Acknowledges education   Clinical Observations/Individualized Feedback: Charene was active in their participation of session activities and group discussion. Pt identified "me and only me" as her healthy supports. Pt successfully completed activity with  all given materials. Pt interacted well with LRT and peers duration of session.   Plan: Continue to engage patient in RT group sessions 2-3x/week.   Rosina Lowenstein, LRT, CTRS 10/03/2022 11:43 AM

## 2022-10-03 NOTE — Progress Notes (Signed)
[facility] MD Progress Note  10/03/2022 4:27 PM Chloe [name]  MRN:  [id number]  Subjective:  Pt chart reviewed, discussed with interdisciplinary team, and seen on rounds. Endorses overall euthymic mood. States she feels fine although is feeling some anxiety about where she will live upon discharge. Reports good sleep and appetite. Denies suicidal,homicidal ideations. Denies auditory visual hallucinations or paranoia. States she cannot return to her previous living situation. Unsure of her discharge plans. Encouraged pt to work with social work for safe discharge plan.  Principal Problem: Bipolar affective disorder, depressed, severe, with psychotic behavior (HCC) Diagnosis: Principal Problem:   Bipolar affective disorder, depressed, severe, with psychotic behavior (HCC)  Total Time spent with patient:  25 minutes  Past Psychiatric History: bipolar d/o  Past Medical History:  Past Medical History:  Diagnosis Date   Anxiety    Bipolar disorder (HCC)    Breast mass, right    GERD (gastroesophageal reflux disease)    Hyperlipidemia    Post-operative nausea and vomiting    Smoker     Past Surgical History:  Procedure Laterality Date   ABDOMINAL HYSTERECTOMY     had nausea and vomiting post-op   BREAST LUMPECTOMY WITH RADIOACTIVE SEED LOCALIZATION Right 11/12/2016   Procedure: RIGHT BREAST LUMPECTOMY WITH RADIOACTIVE SEED LOCALIZATION;  Surgeon: Harriette Bouillon, MD;  Location: [facility];  Service: General;  Laterality: Right;   CHOLECYSTECTOMY     LAPAROSCOPIC LYSIS OF ADHESIONS     MYOMECTOMY     PLANTAR FASCIA SURGERY Left    Family History:  Family History  Problem Relation Age of Onset   Lung cancer Mother    Breast cancer Maternal Aunt    Hyperlipidemia Brother    Colon cancer Neg Hx    Esophageal cancer Neg Hx    Stomach cancer Neg Hx    Family Psychiatric  History: none Social History:  Social History   Substance and Sexual Activity  Alcohol Use Not  Currently   Comment: social     Social History   Substance and Sexual Activity  Drug Use No    Social History   Socioeconomic History   Marital status: Divorced    Spouse name: Not on file   Number of children: Not on file   Years of education: Not on file   Highest education level: Associate degree: occupational, Scientist, product/process development, or vocational program  Occupational History   Not on file  Tobacco Use   Smoking status: Every Day    Current packs/day: 0.50    Types: Cigarettes   Smokeless tobacco: Never  Vaping Use   Vaping status: Never Used  Substance and Sexual Activity   Alcohol use: Not Currently    Comment: social   Drug use: No   Sexual activity: Not Currently  Other Topics Concern   Not on file  Social History Narrative   Not on file   Social Determinants of Health   Financial Resource Strain: High Risk (03/26/2022)   Overall Financial Resource Strain (CARDIA)    Difficulty of Paying Living Expenses: Hard  Food Insecurity: No Food Insecurity (09/29/2022)   Hunger Vital Sign    Worried About Running Out of Food in the Last Year: Never true    Ran Out of Food in the Last Year: Never true  Recent Concern: Food Insecurity - Food Insecurity Present (07/01/2022)   Hunger Vital Sign    Worried About Running Out of Food in the Last Year: Often true    Ran  Out of Food in the Last Year: Often true  Transportation Needs: No Transportation Needs (09/29/2022)   PRAPARE - Administrator, Civil Service (Medical): No    Lack of Transportation (Non-Medical): No  Recent Concern: Transportation Needs - Unmet Transportation Needs (07/01/2022)   PRAPARE - Transportation    Lack of Transportation (Medical): No    Lack of Transportation (Non-Medical): Yes  Physical Activity: Inactive (03/26/2022)   Exercise Vital Sign    Days of Exercise per Week: 0 days    Minutes of Exercise per Session: 0 min  Stress: Stress Concern Present (03/26/2022)   [employer] of Occupational  Health - Occupational Stress Questionnaire    Feeling of Stress : Very much  Social Connections: Socially Isolated (03/26/2022)   Social Connection and Isolation Panel [NHANES]    Frequency of Communication with Friends and Family: Twice a week    Frequency of Social Gatherings with Friends and Family: Once a week    Attends Religious Services: Never    Database administrator or Organizations: No    Attends Engineer, structural: Never    Marital Status: Divorced   Sleep: Good  Appetite:  Good  Current Medications: Current Facility-Administered Medications  Medication Dose Route Frequency Provider Last Rate Last Admin   acetaminophen (TYLENOL) tablet 650 mg  650 mg Oral Q4H PRN Lewis, Jerene Pitch, NP       albuterol (VENTOLIN HFA) 108 (90 Base) MCG/ACT inhaler 2 puff  2 puff Inhalation Q4H PRN Oneta Rack, NP       alum & mag hydroxide-simeth (MAALOX/MYLANTA) 200-200-20 MG/5ML suspension 30 mL  30 mL Oral Q4H PRN Oneta Rack, NP       diphenhydrAMINE (BENADRYL) capsule 50 mg  50 mg Oral TID PRN Oneta Rack, NP       Or   diphenhydrAMINE (BENADRYL) injection 50 mg  50 mg Intramuscular TID PRN Oneta Rack, NP       divalproex (DEPAKOTE) DR tablet 500 mg  500 mg Oral Q12H Charm Rings, NP   500 mg at 10/03/22 1610   haloperidol (HALDOL) tablet 5 mg  5 mg Oral TID PRN Oneta Rack, NP       Or   haloperidol lactate (HALDOL) injection 5 mg  5 mg Intramuscular TID PRN Oneta Rack, NP       hydrOXYzine (ATARAX) tablet 10 mg  10 mg Oral TID PRN Oneta Rack, NP   10 mg at 10/02/22 2124   LORazepam (ATIVAN) tablet 2 mg  2 mg Oral TID PRN Oneta Rack, NP       Or   LORazepam (ATIVAN) injection 2 mg  2 mg Intramuscular TID PRN Oneta Rack, NP       magnesium hydroxide (MILK OF MAGNESIA) suspension 30 mL  30 mL Oral Daily PRN Oneta Rack, NP       mometasone-formoterol (DULERA) 100-5 MCG/ACT inhaler 2 puff  2 puff Inhalation BID Oneta Rack, NP   2  puff at 10/03/22 0928   nicotine (NICODERM CQ - dosed in mg/24 hours) patch 14 mg  14 mg Transdermal Daily Sarina Ill, DO   14 mg at 09/30/22 0905   ondansetron (ZOFRAN) tablet 4 mg  4 mg Oral Q8H PRN Oneta Rack, NP       paliperidone (INVEGA) 24 hr tablet 3 mg  3 mg Oral Daily Charm Rings, NP   3  mg at 10/03/22 0928   pantoprazole (PROTONIX) EC tablet 40 mg  40 mg Oral Daily Oneta Rack, NP   40 mg at 10/03/22 0981   pravastatin (PRAVACHOL) tablet 40 mg  40 mg Oral QPM Oneta Rack, NP   40 mg at 10/02/22 1750   vitamin A & D ointment   Topical PRN Charm Rings, NP   1 Application at 10/02/22 1638    Lab Results: No results found for this or any previous visit (from the past 48 hour(s)).  Blood Alcohol level:  Lab Results  Component Value Date   ETH <10 09/27/2022   ETH <10 11/24/2017    Metabolic Disorder Labs: Lab Results  Component Value Date   HGBA1C 5.4 10/01/2022   MPG 108 10/01/2022   MPG 102.54 11/30/2017   Lab Results  Component Value Date   PROLACTIN 12.4 11/30/2017   Lab Results  Component Value Date   CHOL 163 10/01/2022   TRIG 96 10/01/2022   HDL 51 10/01/2022   CHOLHDL 3.2 10/01/2022   VLDL 19 10/01/2022   LDLCALC 93 10/01/2022   LDLCALC 86 04/04/2022    Physical Findings: AIMS:  , ,  ,  ,    CIWA:    COWS:     Musculoskeletal: Strength & Muscle Tone: within normal limits Gait & Station: normal Patient leans: N/A  Psychiatric Specialty Exam:  Presentation  General Appearance:  Appropriate for Environment; Casual  Eye Contact: Fair  Speech: Clear and Coherent; Normal Rate  Speech Volume: Normal  Handedness: Right   Mood and Affect  Mood: Anxious  Affect: Blunt   Thought Process  Thought Processes: Coherent; Goal Directed; Linear  Descriptions of Associations:Intact  Orientation:Full (Time, Place and Person)  Thought Content:Logical  Ideas of Reference:None  Suicidal Thoughts:Suicidal  Thoughts: No  Homicidal Thoughts:Homicidal Thoughts: No   Sensorium  Memory: Immediate Fair  Judgment: Intact  Insight: Shallow   Executive Functions  Concentration: Fair  Attention Span: Fair  Recall: [employer] of Knowledge: Fair  Language: Fair   Psychomotor Activity  Psychomotor Activity: Psychomotor Activity: Normal   Assets  Assets: Communication Skills; Desire for Improvement; Financial Resources/Insurance; Housing; Resilience; Social Support; Transportation; Vocational/Educational   Sleep  Sleep: Sleep: Good    Physical Exam: Physical Exam Constitutional:      General: She is not in acute distress.    Appearance: She is not ill-appearing, toxic-appearing or diaphoretic.  Eyes:     General: No scleral icterus. Cardiovascular:     Rate and Rhythm: Bradycardia present.  Pulmonary:     Effort: Pulmonary effort is normal. No respiratory distress.  Neurological:     Mental Status: She is alert and oriented to person, place, and time.  Psychiatric:        Attention and Perception: Attention and perception normal.        Mood and Affect: Mood is anxious. Affect is blunt.        Speech: Speech normal.        Behavior: Behavior normal. Behavior is cooperative.        Thought Content: Thought content normal.    Review of Systems  Constitutional:  Negative for chills and fever.  Respiratory:  Negative for shortness of breath.   Cardiovascular:  Negative for chest pain and palpitations.  Gastrointestinal:  Negative for abdominal pain.  Neurological:  Negative for headaches.  Psychiatric/Behavioral:  Negative for hallucinations and suicidal ideas. The patient does not have insomnia.  Blood pressure 116/64, pulse (!) 59, temperature (!) 97.2 F (36.2 C), resp. rate 19, height 5\' 1"  (1.549 m), weight 61.2 kg, SpO2 97%. Body mass index is 25.51 kg/m.  Treatment Plan Summary: Daily contact with patient to assess and evaluate symptoms and  progress in treatment, Medication management, and Plan    -continue current medications  Lauree Chandler, NP 10/03/2022, 4:27 PM

## 2022-10-03 NOTE — Group Note (Signed)
Date:  10/03/2022 Time:  11:48 AM  Group Topic/Focus:  Crisis Planning:   The purpose of this group is to help patients create a crisis plan for use upon discharge or in the future, as needed.    Participation Level:  Active  Participation Quality:  Appropriate  Affect:  Appropriate  Cognitive:  Alert and Appropriate  Insight: Appropriate  Engagement in Group:  Developing/Improving  Modes of Intervention:  Activity and Discussion  Additional Comments:    Canyon Lohr 10/03/2022, 11:48 AM

## 2022-10-03 NOTE — Group Note (Signed)
Date:  10/03/2022 Time:  11:17 PM  Group Topic/Focus:  Developing a Wellness Toolbox:   The focus of this group is to help patients develop a "wellness toolbox" with skills and strategies to promote recovery upon discharge.    Participation Level:  Active  Participation Quality:  Appropriate  Affect:  Appropriate  Cognitive:  Appropriate  Insight: Appropriate  Engagement in Group:  Engaged  Modes of Intervention:  Discussion  Additional Comments:    Lenore Cordia 10/03/2022, 11:17 PM

## 2022-10-04 DIAGNOSIS — F315 Bipolar disorder, current episode depressed, severe, with psychotic features: Secondary | ICD-10-CM | POA: Diagnosis not present

## 2022-10-04 NOTE — Plan of Care (Signed)

## 2022-10-04 NOTE — Progress Notes (Signed)
   10/04/22 1231  Psych Admission Type (Psych Patients Only)  Admission Status Voluntary/72 hour document signed  Date 72 hour document signed  10/04/22  Time 72 hour document signed  1231  Provider Notified (First and Last Name) (see details for LINK to note) Dr. Cephus Richer NP notified as well.

## 2022-10-04 NOTE — Progress Notes (Signed)
D: Patient alert and oriented. Affect/mood reported as improving. Denies SI, HI, AVH, and pain. Patient goal, "communication." Patient rates depression 3/10, hopelessness 3/10 and anxiety 3/10.  A: Scheduled medication administered to patient, per MD orders. Support and encouragement provided. Routine safety checks conducted every 15 minutes. Patient informed to notify staff with problems or concerns.   R: No adverse drug reactions noted. Patient contracts for safety at this time. Patient compliant with medications and treatment plan. Patient receptive, calm and cooperative. Patient interacts well with others on unit. Patient remains safe at this time.

## 2022-10-04 NOTE — Progress Notes (Signed)
EKG completed and placed on chart 

## 2022-10-04 NOTE — Group Note (Signed)
[facility] LCSW Group Therapy Note   Group Date: 10/03/2022 Start Time: 1315 End Time: 1420   Type of Therapy/Topic:  Group Therapy:  Emotion Regulation  Participation Level:  Minimal   Mood:  Description of Group:    The purpose of this group is to assist patients in learning to regulate negative emotions and experience positive emotions. Patients will be guided to discuss ways in which they have been vulnerable to their negative emotions. These vulnerabilities will be juxtaposed with experiences of positive emotions or situations, and patients challenged to use positive emotions to combat negative ones. Special emphasis will be placed on coping with negative emotions in conflict situations, and patients will process healthy conflict resolution skills.  Therapeutic Goals: Patient will identify two positive emotions or experiences to reflect on in order to balance out negative emotions:  Patient will label two or more emotions that they find the most difficult to experience:  Patient will be able to demonstrate positive conflict resolution skills through discussion or role plays:   Summary of Patient Progress: Patient came into group during that last quarter of group. She was not involved in the conversation as much as her peers but did appear to attend to the conversation.  She appeared open and receptive to information from both her peers and facilitator. Insight is questionable along with motivation towards change. However, during her time there her behavior and comments were appropriate.     Therapeutic Modalities:   Cognitive Behavioral Therapy Feelings Identification Dialectical Behavioral Therapy   Glenis Smoker, LCSW

## 2022-10-04 NOTE — Progress Notes (Signed)
   10/04/22 1000  Psych Admission Type (Psych Patients Only)  Admission Status Involuntary  Psychosocial Assessment  Patient Complaints Depression  Eye Contact Brief  Facial Expression Sad  Affect Depressed  Speech Soft  Interaction Assertive  Motor Activity Slow  Appearance/Hygiene In scrubs  Behavior Characteristics Appropriate to situation  Mood Depressed  Thought Process  Coherency WDL  Content Preoccupation  Delusions None reported or observed  Perception WDL  Hallucination None reported or observed  Judgment Limited  Confusion Mild  Danger to Self  Current suicidal ideation? Denies  Agreement Not to Harm Self Yes  Description of Agreement verbal  Danger to Others  Danger to Others None reported or observed

## 2022-10-04 NOTE — Plan of Care (Signed)
  Problem: Safety: Goal: Ability to remain free from injury will improve Outcome: Progressing   Problem: Education: Goal: Emotional status will improve Outcome: Progressing Goal: Mental status will improve Outcome: Progressing   

## 2022-10-04 NOTE — Group Note (Signed)
Date:  10/04/2022 Time:  11:25 AM  Group Topic/Focus:  Building Self Esteem:   The Focus of this group is helping patients become aware of the effects of self-esteem on their lives, the things they and others do that enhance or undermine their self-esteem, seeing the relationship between their level of self-esteem and the choices they make and learning ways to enhance self-esteem. Coping With Mental Health Crisis:   The purpose of this group is to help patients identify strategies for coping with mental health crisis.  Group discusses possible causes of crisis and ways to manage them effectively. Goals Group:   The focus of this group is to help patients establish daily goals to achieve during treatment and discuss how the patient can incorporate goal setting into their daily lives to aide in recovery.    Participation Level:  Did Not Attend  Participation Quality:    Affect:    Cognitive:    Insight:   Engagement in Group:   Modes of Intervention:    Additional Comments:    Chloe [name] 10/04/2022, 11:25 AM

## 2022-10-04 NOTE — Progress Notes (Signed)
[facility] MD Progress Note  10/04/2022 1:09 PM Chloe [name]  MRN:  [id number]  Subjective:  Pt chart reviewed, discussed with interdisciplinary team, and seen on rounds. Today, she reported a low level of depression related to her situation and not knowing if she has access to her brother's car she was using or where she will live after discharge.  Sleep is "pretty good", appetite is "too good".  Low anxiety regarding the uncertainties in her life at this time.  Denies side effects from her medications.  Principal Problem: Bipolar affective disorder, depressed, severe, with psychotic behavior (HCC) Diagnosis: Principal Problem:   Bipolar affective disorder, depressed, severe, with psychotic behavior (HCC)  Total Time spent with patient:  25 minutes  Past Psychiatric History: bipolar d/o  Past Medical History:  Past Medical History:  Diagnosis Date   Anxiety    Bipolar disorder (HCC)    Breast mass, right    GERD (gastroesophageal reflux disease)    Hyperlipidemia    Post-operative nausea and vomiting    Smoker     Past Surgical History:  Procedure Laterality Date   ABDOMINAL HYSTERECTOMY     had nausea and vomiting post-op   BREAST LUMPECTOMY WITH RADIOACTIVE SEED LOCALIZATION Right 11/12/2016   Procedure: RIGHT BREAST LUMPECTOMY WITH RADIOACTIVE SEED LOCALIZATION;  Surgeon: Harriette Bouillon, MD;  Location: [facility];  Service: General;  Laterality: Right;   CHOLECYSTECTOMY     LAPAROSCOPIC LYSIS OF ADHESIONS     MYOMECTOMY     PLANTAR FASCIA SURGERY Left    Family History:  Family History  Problem Relation Age of Onset   Lung cancer Mother    Breast cancer Maternal Aunt    Hyperlipidemia Brother    Colon cancer Neg Hx    Esophageal cancer Neg Hx    Stomach cancer Neg Hx    Family Psychiatric  History: none Social History:  Social History   Substance and Sexual Activity  Alcohol Use Not Currently   Comment: social     Social History   Substance and  Sexual Activity  Drug Use No    Social History   Socioeconomic History   Marital status: Divorced    Spouse name: Not on file   Number of children: Not on file   Years of education: Not on file   Highest education level: Associate degree: occupational, Scientist, product/process development, or vocational program  Occupational History   Not on file  Tobacco Use   Smoking status: Every Day    Current packs/day: 0.50    Types: Cigarettes   Smokeless tobacco: Never  Vaping Use   Vaping status: Never Used  Substance and Sexual Activity   Alcohol use: Not Currently    Comment: social   Drug use: No   Sexual activity: Not Currently  Other Topics Concern   Not on file  Social History Narrative   Not on file   Social Determinants of Health   Financial Resource Strain: High Risk (03/26/2022)   Overall Financial Resource Strain (CARDIA)    Difficulty of Paying Living Expenses: Hard  Food Insecurity: No Food Insecurity (09/29/2022)   Hunger Vital Sign    Worried About Running Out of Food in the Last Year: Never true    Ran Out of Food in the Last Year: Never true  Recent Concern: Food Insecurity - Food Insecurity Present (07/01/2022)   Hunger Vital Sign    Worried About Running Out of Food in the Last Year: Often true  Ran Out of Food in the Last Year: Often true  Transportation Needs: No Transportation Needs (09/29/2022)   PRAPARE - Administrator, Civil Service (Medical): No    Lack of Transportation (Non-Medical): No  Recent Concern: Transportation Needs - Unmet Transportation Needs (07/01/2022)   PRAPARE - Transportation    Lack of Transportation (Medical): No    Lack of Transportation (Non-Medical): Yes  Physical Activity: Inactive (03/26/2022)   Exercise Vital Sign    Days of Exercise per Week: 0 days    Minutes of Exercise per Session: 0 min  Stress: Stress Concern Present (03/26/2022)   [employer] of Occupational Health - Occupational Stress Questionnaire    Feeling of Stress :  Very much  Social Connections: Socially Isolated (03/26/2022)   Social Connection and Isolation Panel [NHANES]    Frequency of Communication with Friends and Family: Twice a week    Frequency of Social Gatherings with Friends and Family: Once a week    Attends Religious Services: Never    Database administrator or Organizations: No    Attends Engineer, structural: Never    Marital Status: Divorced   Sleep: Good  Appetite:  Good  Current Medications: Current Facility-Administered Medications  Medication Dose Route Frequency Provider Last Rate Last Admin   acetaminophen (TYLENOL) tablet 650 mg  650 mg Oral Q4H PRN Lewis, Jerene Pitch, NP       albuterol (VENTOLIN HFA) 108 (90 Base) MCG/ACT inhaler 2 puff  2 puff Inhalation Q4H PRN Oneta Rack, NP       alum & mag hydroxide-simeth (MAALOX/MYLANTA) 200-200-20 MG/5ML suspension 30 mL  30 mL Oral Q4H PRN Oneta Rack, NP       diphenhydrAMINE (BENADRYL) capsule 50 mg  50 mg Oral TID PRN Oneta Rack, NP       Or   diphenhydrAMINE (BENADRYL) injection 50 mg  50 mg Intramuscular TID PRN Oneta Rack, NP       divalproex (DEPAKOTE) DR tablet 500 mg  500 mg Oral Q12H Charm Rings, NP   500 mg at 10/04/22 2130   haloperidol (HALDOL) tablet 5 mg  5 mg Oral TID PRN Oneta Rack, NP       Or   haloperidol lactate (HALDOL) injection 5 mg  5 mg Intramuscular TID PRN Oneta Rack, NP       hydrOXYzine (ATARAX) tablet 10 mg  10 mg Oral TID PRN Oneta Rack, NP   10 mg at 10/03/22 2112   LORazepam (ATIVAN) tablet 2 mg  2 mg Oral TID PRN Oneta Rack, NP       Or   LORazepam (ATIVAN) injection 2 mg  2 mg Intramuscular TID PRN Oneta Rack, NP       magnesium hydroxide (MILK OF MAGNESIA) suspension 30 mL  30 mL Oral Daily PRN Oneta Rack, NP       mometasone-formoterol (DULERA) 100-5 MCG/ACT inhaler 2 puff  2 puff Inhalation BID Oneta Rack, NP   2 puff at 10/04/22 0851   nicotine (NICODERM CQ - dosed in mg/24  hours) patch 14 mg  14 mg Transdermal Daily Sarina Ill, DO   14 mg at 09/30/22 0905   ondansetron (ZOFRAN) tablet 4 mg  4 mg Oral Q8H PRN Oneta Rack, NP       paliperidone (INVEGA) 24 hr tablet 3 mg  3 mg Oral Daily Charm Rings, NP  3 mg at 10/04/22 0851   pantoprazole (PROTONIX) EC tablet 40 mg  40 mg Oral Daily Oneta Rack, NP   40 mg at 10/04/22 0851   pravastatin (PRAVACHOL) tablet 40 mg  40 mg Oral QPM Oneta Rack, NP   40 mg at 10/03/22 1752   vitamin A & D ointment   Topical PRN Charm Rings, NP   1 Application at 10/02/22 1638    Lab Results: No results found for this or any previous visit (from the past 48 hour(s)).  Blood Alcohol level:  Lab Results  Component Value Date   ETH <10 09/27/2022   ETH <10 11/24/2017    Metabolic Disorder Labs: Lab Results  Component Value Date   HGBA1C 5.4 10/01/2022   MPG 108 10/01/2022   MPG 102.54 11/30/2017   Lab Results  Component Value Date   PROLACTIN 12.4 11/30/2017   Lab Results  Component Value Date   CHOL 163 10/01/2022   TRIG 96 10/01/2022   HDL 51 10/01/2022   CHOLHDL 3.2 10/01/2022   VLDL 19 10/01/2022   LDLCALC 93 10/01/2022   LDLCALC 86 04/04/2022     Musculoskeletal: Strength & Muscle Tone: within normal limits Gait & Station: normal Patient leans: N/A   Psychiatric Specialty Exam: Physical Exam Vitals reviewed.  Constitutional:      Appearance: Normal appearance.  HENT:     Head: Normocephalic.     Nose: Nose normal.  Pulmonary:     Effort: Pulmonary effort is normal.  Musculoskeletal:        General: Normal range of motion.     Cervical back: Normal range of motion.  Neurological:     General: No focal deficit present.     Mental Status: She is alert and oriented to person, place, and time.  Psychiatric:        Attention and Perception: Attention and perception normal.        Mood and Affect: Mood is anxious and depressed.        Speech: Speech normal.         Behavior: Behavior normal. Behavior is cooperative.        Thought Content: Thought content normal.        Cognition and Memory: Cognition and memory normal.        Judgment: Judgment is impulsive.       Review of Systems  Psychiatric/Behavioral:  Positive for depression and dysphoric mood. The patient is nervous/anxious.   All other systems reviewed and are negative.    Blood pressure (!) 115/44, pulse 73, temperature (!) 97.2 F (36.2 C), resp. rate (!) 21, height 5\' 1"  (1.549 m), weight 61.2 kg, SpO2 100%.Body mass index is 25.51 kg/m.  General Appearance: Casual  Eye Contact:  Fair  Speech:  Normal Rate  Volume:  Normal  Mood:  anxious and depression  Affect:  Congruent  Thought Process:  Coherent  Orientation:  Full (Time, Place, and Person)  Thought Content:  WDL  Suicidal Thoughts:  No  Homicidal Thoughts:  No  Memory:  Immediate;   Fair Recent;   Fair Remote;   Fair  Judgement:  Fair  Insight:  Lacking  Psychomotor Activity:  Normal  Concentration:  Concentration: Fair and Attention Span: Fair  Recall:  [employer] of Knowledge:  Fair  Language:  Good  Akathisia:  No  Handed:  Right  AIMS (if indicated):     Assets:  Leisure Time Physical Health Resilience Social  Support  ADL's:  Intact  Cognition:  WNL  Sleep:        Physical Exam: Physical Exam Vitals and nursing note reviewed.  Constitutional:      General: She is not in acute distress.    Appearance: She is not ill-appearing, toxic-appearing or diaphoretic.  HENT:     Head: Normocephalic.     Nose: Nose normal.  Eyes:     General: No scleral icterus. Cardiovascular:     Rate and Rhythm: Bradycardia present.  Pulmonary:     Effort: Pulmonary effort is normal. No respiratory distress.  Musculoskeletal:        General: Normal range of motion.  Neurological:     General: No focal deficit present.     Mental Status: She is alert and oriented to person, place, and time.  Psychiatric:         Attention and Perception: Attention and perception normal.        Mood and Affect: Mood is anxious and depressed. Affect is blunt.        Speech: Speech normal.        Behavior: Behavior normal. Behavior is cooperative.        Thought Content: Thought content normal.        Cognition and Memory: Cognition and memory normal.        Judgment: Judgment normal.    Review of Systems  Constitutional:  Negative for chills and fever.  Respiratory:  Negative for shortness of breath.   Cardiovascular:  Negative for chest pain and palpitations.  Gastrointestinal:  Negative for abdominal pain.  Neurological:  Negative for headaches.  Psychiatric/Behavioral:  Positive for depression. Negative for hallucinations and suicidal ideas. The patient is nervous/anxious. The patient does not have insomnia.   All other systems reviewed and are negative.  Blood pressure 126/69, pulse 66, temperature 97.8 F (36.6 C), temperature source Oral, resp. rate 16, height 5\' 1"  (1.549 m), weight 61.2 kg, SpO2 96%. Body mass index is 25.51 kg/m.  Treatment Plan Summary: Daily contact with patient to assess and evaluate symptoms and progress in treatment, Medication management, and Plan    Bipolar affective disorder, depressed, severe: Depakote 500 mg BID Invega  3 mg daily Lipid panel WDL, TSH WDL, and A1C WDL & EKG awaiting results   Anxiety: Hydroxyzine 10 mg TID PRN   Nanine Means, NP 10/04/2022, 1:09 PM

## 2022-10-04 NOTE — Progress Notes (Signed)
No complaints or concerns voiced. Isolative to self and room. Denies all. Medication compliant.  Encouragement and support provided. Safety checks maintained. Medications given as prescribed. Pt receptive and remains safe on unit with q 15 min checks.

## 2022-10-04 NOTE — Group Note (Signed)
Recreation Therapy Group Note   Group Topic:Leisure Education  Group Date: 10/04/2022 Start Time: 1000 End Time: 1100 Facilitators: Rosina Lowenstein, LRT, CTRS Location:  Craft Room  Group Description: Leisure. Patients were given the option to choose from coloring, singing karaoke, journaling, or playing cards. LRT and pts discussed the meaning of leisure, the importance of participating in leisure during their free time/when they're outside of the hospital, as well as how our leisure interests can also serve as coping skills. Pt identified two leisure interests and shared with the group.   Goal Area(s) Addressed:  Patient will identify a current leisure interest.  Patient will learn the definition of "leisure". Patient will practice making a positive decision. Patient will have the opportunity to try a new leisure activity. Patient will communicate with peers and LRT.    Affect/Mood: Appropriate and Flat   Participation Level: Moderate   Participation Quality: Independent   Behavior: Appropriate and Cooperative   Speech/Thought Process: Coherent   Insight: Fair   Judgement: Good   Modes of Intervention: Activity   Patient Response to Interventions:  Receptive   Education Outcome:  Acknowledges education   Clinical Observations/Individualized Feedback: Jhoseline was mostly active in their participation of session activities and group discussion. Pt identified "drink coffee and talk" as things she does in her free time. Pt chose to sit and listen to music while in group. Pt seemed somewhat tired today. Pt interacted well with LRT and peers duration of session.   Plan: Continue to engage patient in RT group sessions 2-3x/week.   Rosina Lowenstein, LRT, CTRS 10/04/2022 11:57 AM

## 2022-10-05 DIAGNOSIS — F315 Bipolar disorder, current episode depressed, severe, with psychotic features: Secondary | ICD-10-CM | POA: Diagnosis not present

## 2022-10-05 NOTE — Progress Notes (Signed)
  D: Patient alert and oriented, able to make needs known. Denies SI/HI, AVH at present. Denies pain at present. Patient goal today "getting placed." Rates depression 2/10, hopelessness 2/10, and anxiety 2/10. Patient reports energy level as normal. She reports she slept well last night. Patient does not request any PRN medication at this time.    A: Scheduled medications administered to patient per MD order. Support and encouragement provided. Routine safety checks conducted every fifteen minutes. Patient informed to notify staff with problems or concerns. Frequent verbal contact made.    R: No adverse drug reactions noted. Patient contracts for safety at this time. Patient is compliant with medications and treatment plan. Patient receptive, calm and cooperative. Patient interacts with others appropriately on unit at present. Patient remains safe at present.

## 2022-10-05 NOTE — Plan of Care (Signed)

## 2022-10-05 NOTE — BH IP Treatment Plan (Unsigned)
Interdisciplinary Treatment and Diagnostic Plan Update  10/05/2022 Time of Session: 12:25 pm Chloe [name] MRN: [id number]  Principal Diagnosis: Bipolar affective disorder, depressed, severe, with psychotic behavior (HCC)  Secondary Diagnoses: Principal Problem:   Bipolar affective disorder, depressed, severe, with psychotic behavior (HCC)   Current Medications:  Current Facility-Administered Medications  Medication Dose Route Frequency Provider Last Rate Last Admin   acetaminophen (TYLENOL) tablet 650 mg  650 mg Oral Q4H PRN Oneta Rack, NP       albuterol (VENTOLIN HFA) 108 (90 Base) MCG/ACT inhaler 2 puff  2 puff Inhalation Q4H PRN Oneta Rack, NP       alum & mag hydroxide-simeth (MAALOX/MYLANTA) 200-200-20 MG/5ML suspension 30 mL  30 mL Oral Q4H PRN Oneta Rack, NP       diphenhydrAMINE (BENADRYL) capsule 50 mg  50 mg Oral TID PRN Oneta Rack, NP       Or   diphenhydrAMINE (BENADRYL) injection 50 mg  50 mg Intramuscular TID PRN Oneta Rack, NP       divalproex (DEPAKOTE) DR tablet 500 mg  500 mg Oral Q12H Charm Rings, NP   500 mg at 10/05/22 0856   haloperidol (HALDOL) tablet 5 mg  5 mg Oral TID PRN Oneta Rack, NP       Or   haloperidol lactate (HALDOL) injection 5 mg  5 mg Intramuscular TID PRN Oneta Rack, NP       hydrOXYzine (ATARAX) tablet 10 mg  10 mg Oral TID PRN Oneta Rack, NP   10 mg at 10/04/22 2122   LORazepam (ATIVAN) tablet 2 mg  2 mg Oral TID PRN Oneta Rack, NP       Or   LORazepam (ATIVAN) injection 2 mg  2 mg Intramuscular TID PRN Oneta Rack, NP       magnesium hydroxide (MILK OF MAGNESIA) suspension 30 mL  30 mL Oral Daily PRN Oneta Rack, NP       mometasone-formoterol (DULERA) 100-5 MCG/ACT inhaler 2 puff  2 puff Inhalation BID Oneta Rack, NP   2 puff at 10/05/22 0856   nicotine (NICODERM CQ - dosed in mg/24 hours) patch 14 mg  14 mg Transdermal Daily Sarina Ill, DO   14 mg at 09/30/22 0905    ondansetron (ZOFRAN) tablet 4 mg  4 mg Oral Q8H PRN Oneta Rack, NP       paliperidone (INVEGA) 24 hr tablet 3 mg  3 mg Oral Daily Charm Rings, NP   3 mg at 10/05/22 0856   pantoprazole (PROTONIX) EC tablet 40 mg  40 mg Oral Daily Oneta Rack, NP   40 mg at 10/05/22 0856   pravastatin (PRAVACHOL) tablet 40 mg  40 mg Oral QPM Oneta Rack, NP   40 mg at 10/04/22 1707   vitamin A & D ointment   Topical PRN Charm Rings, NP   1 Application at 10/02/22 1638   PTA Medications: Medications Prior to Admission  Medication Sig Dispense Refill Last Dose   albuterol (PROVENTIL HFA) 108 (90 Base) MCG/ACT inhaler Inhale 2 puffs into the lungs every 4 (four) hours as needed for wheezing or shortness of breath. (Patient not taking: Reported on 09/03/2022) 6.7 g 1    cetirizine (ZYRTEC ALLERGY) 10 MG tablet Take 1 tablet (10 mg total) by mouth daily as needed for allergies. 100 tablet 1    fluticasone-salmeterol (ADVAIR) 100-50 MCG/ACT AEPB  Inhale 1 puff into the lungs 2 (two) times daily. (Patient not taking: Reported on 09/03/2022) 60 each 3    Iloperidone (FANAPT) 6 MG TABS Take 1 tablet (6 mg total) by mouth 2 (two) times daily. 60 tablet 3    lamoTRIgine (LAMICTAL) 200 MG tablet Take 1 tablet (200 mg total) by mouth 2 (two) times daily. 60 tablet 3    omeprazole (PRILOSEC) 20 MG capsule Take 1 capsule (20 mg total) by mouth daily. 30 capsule 2    pravastatin (PRAVACHOL) 40 MG tablet Take 1 tablet (40 mg total) by mouth daily. 90 tablet 1     Patient Stressors: Marital or family conflict    Patient Strengths: Capable of independent living  Forensic psychologist fund of knowledge   Treatment Modalities: Medication Management, Group therapy, Case management,  1 to 1 session with clinician, Psychoeducation, Recreational therapy.   Physician Treatment Plan for Primary Diagnosis: Bipolar affective disorder, depressed, severe, with psychotic behavior (HCC) Long Term Goal(s):  Improvement in symptoms so as ready for discharge   Short Term Goals: Ability to identify changes in lifestyle to reduce recurrence of condition will improve Ability to verbalize feelings will improve Ability to disclose and discuss suicidal ideas Ability to demonstrate self-control will improve Ability to identify and develop effective coping behaviors will improve Ability to maintain clinical measurements within normal limits will improve Compliance with prescribed medications will improve Ability to identify triggers associated with substance abuse/mental health issues will improve  Medication Management: Evaluate patient's response, side effects, and tolerance of medication regimen.  Therapeutic Interventions: 1 to 1 sessions, Unit Group sessions and Medication administration.  Evaluation of Outcomes: Progressing  Physician Treatment Plan for Secondary Diagnosis: Principal Problem:   Bipolar affective disorder, depressed, severe, with psychotic behavior (HCC)  Long Term Goal(s): Improvement in symptoms so as ready for discharge   Short Term Goals: Ability to identify changes in lifestyle to reduce recurrence of condition will improve Ability to verbalize feelings will improve Ability to disclose and discuss suicidal ideas Ability to demonstrate self-control will improve Ability to identify and develop effective coping behaviors will improve Ability to maintain clinical measurements within normal limits will improve Compliance with prescribed medications will improve Ability to identify triggers associated with substance abuse/mental health issues will improve     Medication Management: Evaluate patient's response, side effects, and tolerance of medication regimen.  Therapeutic Interventions: 1 to 1 sessions, Unit Group sessions and Medication administration.  Evaluation of Outcomes: Progressing   RN Treatment Plan for Primary Diagnosis: Bipolar affective disorder, depressed,  severe, with psychotic behavior (HCC) Long Term Goal(s): Knowledge of disease and therapeutic regimen to maintain health will improve  Short Term Goals: Ability to remain free from injury will improve, Ability to verbalize frustration and anger appropriately will improve, Ability to demonstrate self-control, Ability to participate in decision making will improve, Ability to verbalize feelings will improve, Ability to disclose and discuss suicidal ideas, Ability to identify and develop effective coping behaviors will improve, and Compliance with prescribed medications will improve  Medication Management: RN will administer medications as ordered by provider, will assess and evaluate patient's response and provide education to patient for prescribed medication. RN will report any adverse and/or side effects to prescribing provider.  Therapeutic Interventions: 1 on 1 counseling sessions, Psychoeducation, Medication administration, Evaluate responses to treatment, Monitor vital signs and CBGs as ordered, Perform/monitor CIWA, COWS, AIMS and Fall Risk screenings as ordered, Perform wound care treatments as ordered.  Evaluation of  Outcomes: Progressing   LCSW Treatment Plan for Primary Diagnosis: Bipolar affective disorder, depressed, severe, with psychotic behavior (HCC) Long Term Goal(s): Safe transition to appropriate next level of care at discharge, Engage patient in therapeutic group addressing interpersonal concerns.  Short Term Goals: Engage patient in aftercare planning with referrals and resources, Increase social support, Increase ability to appropriately verbalize feelings, Increase emotional regulation, Facilitate acceptance of mental health diagnosis and concerns, and Increase skills for wellness and recovery  Therapeutic Interventions: Assess for all discharge needs, 1 to 1 time with Social worker, Explore available resources and support systems, Assess for adequacy in community support  network, Educate family and significant other(s) on suicide prevention, Complete Psychosocial Assessment, Interpersonal group therapy.  Evaluation of Outcomes: Progressing   Progress in Treatment: Attending groups: Yes. Participating in groups: Yes. Taking medication as prescribed: Yes. Toleration medication: Yes. Family/Significant other contact made: No, will contact:  once provided with contact info. Patient understands diagnosis: Yes. Discussing patient identified problems/goals with staff: Yes. Medical problems stabilized or resolved: Yes. Denies suicidal/homicidal ideation: Yes. Issues/concerns per patient self-inventory: No. Other: none  New problem(s) identified: No, Describe:  none  New Short Term/Long Term Goal(s): detox, elimination of symptoms of psychosis, medication management for mood stabilization; elimination of SI thoughts; development of comprehensive mental wellness/sobriety plan. Update 10/05/22: None at this time.   Patient Goals:  "I take my medicine.  I have some other things on my mind that I would want confirmation about. Update 10/05/22: None at this time.   Discharge Plan or Barriers:  CSW to assist patient in development of appropriate  discharge plans. Update 10/05/22: Unclear where the patient will go once discharged because she is unable to return to current living situation.  Reason for Continuation of Hospitalization: Anxiety Delusions  Depression Hallucinations Medical Issues Medication stabilization Suicidal ideation  Estimated Length of Stay: 1-7 days  Update 10/05/22: None at this time.  Last 3 [place] Suicide Severity Risk Score: Flowsheet Row Admission (Current) from 09/28/2022 in [facility]PAT[facility] ED from 09/27/2022 in [facility] Emergency Department at [facility] Office Visit from 03/26/2022 in [facility]  C-SSRS RISK CATEGORY No Risk No Risk No Risk       Last PHQ 2/9 Scores:     07/01/2022    5:10 PM 04/04/2022    9:49 AM 03/26/2022   11:18 AM  Depression screen PHQ 2/9  Decreased Interest 2  0  Down, Depressed, Hopeless 2 2 2   PHQ - 2 Score 4 2 2   Altered sleeping 2 1 0  Tired, decreased energy 2 2   Change in appetite 3 2   Feeling bad or failure about yourself  3 3   Trouble concentrating 3 3   Moving slowly or fidgety/restless 3 3   Suicidal thoughts 1 0   PHQ-9 Score 21 16 2     Scribe for Treatment Team: Marshell Levan, LCSW 10/05/2022 12:25 PM

## 2022-10-05 NOTE — Progress Notes (Signed)
[facility] MD Progress Note  10/05/2022 10:19 AM Chloe [name]  MRN:  [id number]  Subjective:  Pt chart reviewed, discussed with interdisciplinary team, and seen on rounds. Today, she reported "my depression is not going to change much because of my situation," as she cannot return to live with her brother and has no where to go.  She did state her depression is "moderate" with no suicidal ideations.  Her anxiety is "the same".  Sleep is "ok", appetite is "good", denies side effects from her medications.  Her urinary incontinence is "a lot better".  She requested this provider call her brother to inquire about the use of his car after discharge and other things.  Her brother called and a message left for him as there was no answer.  She communicated that he said we would find her a place.  This provider let her know that was not our role and the social worker could assist in providing her resources to explore.  Principal Problem: Bipolar affective disorder, depressed, severe, with psychotic behavior (HCC) Diagnosis: Principal Problem:   Bipolar affective disorder, depressed, severe, with psychotic behavior (HCC)  Total Time spent with patient:  25 minutes  Past Psychiatric History: bipolar d/o  Past Medical History:  Past Medical History:  Diagnosis Date   Anxiety    Bipolar disorder (HCC)    Breast mass, right    GERD (gastroesophageal reflux disease)    Hyperlipidemia    Post-operative nausea and vomiting    Smoker     Past Surgical History:  Procedure Laterality Date   ABDOMINAL HYSTERECTOMY     had nausea and vomiting post-op   BREAST LUMPECTOMY WITH RADIOACTIVE SEED LOCALIZATION Right 11/12/2016   Procedure: RIGHT BREAST LUMPECTOMY WITH RADIOACTIVE SEED LOCALIZATION;  Surgeon: Harriette Bouillon, MD;  Location: [facility];  Service: General;  Laterality: Right;   CHOLECYSTECTOMY     LAPAROSCOPIC LYSIS OF ADHESIONS     MYOMECTOMY     PLANTAR FASCIA SURGERY Left     Family History:  Family History  Problem Relation Age of Onset   Lung cancer Mother    Breast cancer Maternal Aunt    Hyperlipidemia Brother    Colon cancer Neg Hx    Esophageal cancer Neg Hx    Stomach cancer Neg Hx    Family Psychiatric  History: none Social History:  Social History   Substance and Sexual Activity  Alcohol Use Not Currently   Comment: social     Social History   Substance and Sexual Activity  Drug Use No    Social History   Socioeconomic History   Marital status: Divorced    Spouse name: Not on file   Number of children: Not on file   Years of education: Not on file   Highest education level: Associate degree: occupational, Scientist, product/process development, or vocational program  Occupational History   Not on file  Tobacco Use   Smoking status: Every Day    Current packs/day: 0.50    Types: Cigarettes   Smokeless tobacco: Never  Vaping Use   Vaping status: Never Used  Substance and Sexual Activity   Alcohol use: Not Currently    Comment: social   Drug use: No   Sexual activity: Not Currently  Other Topics Concern   Not on file  Social History Narrative   Not on file   Social Determinants of Health   Financial Resource Strain: High Risk (03/26/2022)   Overall Financial Resource Strain (CARDIA)  Difficulty of Paying Living Expenses: Hard  Food Insecurity: No Food Insecurity (09/29/2022)   Hunger Vital Sign    Worried About Running Out of Food in the Last Year: Never true    Ran Out of Food in the Last Year: Never true  Recent Concern: Food Insecurity - Food Insecurity Present (07/01/2022)   Hunger Vital Sign    Worried About Running Out of Food in the Last Year: Often true    Ran Out of Food in the Last Year: Often true  Transportation Needs: No Transportation Needs (09/29/2022)   PRAPARE - Administrator, Civil Service (Medical): No    Lack of Transportation (Non-Medical): No  Recent Concern: Transportation Needs - Unmet Transportation Needs  (07/01/2022)   PRAPARE - Transportation    Lack of Transportation (Medical): No    Lack of Transportation (Non-Medical): Yes  Physical Activity: Inactive (03/26/2022)   Exercise Vital Sign    Days of Exercise per Week: 0 days    Minutes of Exercise per Session: 0 min  Stress: Stress Concern Present (03/26/2022)   [employer] of Occupational Health - Occupational Stress Questionnaire    Feeling of Stress : Very much  Social Connections: Socially Isolated (03/26/2022)   Social Connection and Isolation Panel [NHANES]    Frequency of Communication with Friends and Family: Twice a week    Frequency of Social Gatherings with Friends and Family: Once a week    Attends Religious Services: Never    Database administrator or Organizations: No    Attends Engineer, structural: Never    Marital Status: Divorced   Sleep: Good  Appetite:  Good  Current Medications: Current Facility-Administered Medications  Medication Dose Route Frequency Provider Last Rate Last Admin   acetaminophen (TYLENOL) tablet 650 mg  650 mg Oral Q4H PRN Lewis, Jerene Pitch, NP       albuterol (VENTOLIN HFA) 108 (90 Base) MCG/ACT inhaler 2 puff  2 puff Inhalation Q4H PRN Oneta Rack, NP       alum & mag hydroxide-simeth (MAALOX/MYLANTA) 200-200-20 MG/5ML suspension 30 mL  30 mL Oral Q4H PRN Oneta Rack, NP       diphenhydrAMINE (BENADRYL) capsule 50 mg  50 mg Oral TID PRN Oneta Rack, NP       Or   diphenhydrAMINE (BENADRYL) injection 50 mg  50 mg Intramuscular TID PRN Oneta Rack, NP       divalproex (DEPAKOTE) DR tablet 500 mg  500 mg Oral Q12H Charm Rings, NP   500 mg at 10/05/22 0856   haloperidol (HALDOL) tablet 5 mg  5 mg Oral TID PRN Oneta Rack, NP       Or   haloperidol lactate (HALDOL) injection 5 mg  5 mg Intramuscular TID PRN Oneta Rack, NP       hydrOXYzine (ATARAX) tablet 10 mg  10 mg Oral TID PRN Oneta Rack, NP   10 mg at 10/04/22 2122   LORazepam (ATIVAN) tablet 2 mg   2 mg Oral TID PRN Oneta Rack, NP       Or   LORazepam (ATIVAN) injection 2 mg  2 mg Intramuscular TID PRN Oneta Rack, NP       magnesium hydroxide (MILK OF MAGNESIA) suspension 30 mL  30 mL Oral Daily PRN Oneta Rack, NP       mometasone-formoterol (DULERA) 100-5 MCG/ACT inhaler 2 puff  2 puff Inhalation BID Hillery Jacks  N, NP   2 puff at 10/05/22 0856   nicotine (NICODERM CQ - dosed in mg/24 hours) patch 14 mg  14 mg Transdermal Daily Sarina Ill, DO   14 mg at 09/30/22 0905   ondansetron (ZOFRAN) tablet 4 mg  4 mg Oral Q8H PRN Oneta Rack, NP       paliperidone (INVEGA) 24 hr tablet 3 mg  3 mg Oral Daily Charm Rings, NP   3 mg at 10/05/22 0856   pantoprazole (PROTONIX) EC tablet 40 mg  40 mg Oral Daily Oneta Rack, NP   40 mg at 10/05/22 0856   pravastatin (PRAVACHOL) tablet 40 mg  40 mg Oral QPM Oneta Rack, NP   40 mg at 10/04/22 1707   vitamin A & D ointment   Topical PRN Charm Rings, NP   1 Application at 10/02/22 1638    Lab Results: No results found for this or any previous visit (from the past 48 hour(s)).  Blood Alcohol level:  Lab Results  Component Value Date   ETH <10 09/27/2022   ETH <10 11/24/2017    Metabolic Disorder Labs: Lab Results  Component Value Date   HGBA1C 5.4 10/01/2022   MPG 108 10/01/2022   MPG 102.54 11/30/2017   Lab Results  Component Value Date   PROLACTIN 12.4 11/30/2017   Lab Results  Component Value Date   CHOL 163 10/01/2022   TRIG 96 10/01/2022   HDL 51 10/01/2022   CHOLHDL 3.2 10/01/2022   VLDL 19 10/01/2022   LDLCALC 93 10/01/2022   LDLCALC 86 04/04/2022     Musculoskeletal: Strength & Muscle Tone: within normal limits Gait & Station: normal Patient leans: N/A   Psychiatric Specialty Exam: Physical Exam Vitals reviewed.  Constitutional:      Appearance: Normal appearance.  HENT:     Head: Normocephalic.     Nose: Nose normal.  Pulmonary:     Effort: Pulmonary effort is  normal.  Musculoskeletal:        General: Normal range of motion.     Cervical back: Normal range of motion.  Neurological:     General: No focal deficit present.     Mental Status: She is alert and oriented to person, place, and time.  Psychiatric:        Attention and Perception: Attention and perception normal.        Mood and Affect: Mood is anxious and depressed.        Speech: Speech normal.        Behavior: Behavior normal. Behavior is cooperative.        Thought Content: Thought content normal.        Cognition and Memory: Cognition and memory normal.        Judgment: Judgment is impulsive.       Review of Systems  Psychiatric/Behavioral:  Positive for depression and dysphoric mood. The patient is nervous/anxious.   All other systems reviewed and are negative.    Blood pressure (!) 115/44, pulse 73, temperature (!) 97.2 F (36.2 C), resp. rate (!) 21, height 5\' 1"  (1.549 m), weight 61.2 kg, SpO2 100%.Body mass index is 25.51 kg/m.  General Appearance: Casual  Eye Contact:  Fair  Speech:  Normal Rate  Volume:  Normal  Mood:  anxious and depression  Affect:  Congruent  Thought Process:  Coherent  Orientation:  Full (Time, Place, and Person)  Thought Content:  WDL  Suicidal Thoughts:  No  Homicidal Thoughts:  No  Memory:  Immediate;   Fair Recent;   Fair Remote;   Fair  Judgement:  Fair  Insight:  Lacking  Psychomotor Activity:  Normal  Concentration:  Concentration: Fair and Attention Span: Fair  Recall:  [employer] of Knowledge:  Fair  Language:  Good  Akathisia:  No  Handed:  Right  AIMS (if indicated):     Assets:  Leisure Time Physical Health Resilience Social Support  ADL's:  Intact  Cognition:  WNL  Sleep:        Physical Exam: Physical Exam Vitals and nursing note reviewed.  Constitutional:      General: She is not in acute distress.    Appearance: She is not ill-appearing, toxic-appearing or diaphoretic.  HENT:     Head: Normocephalic.      Nose: Nose normal.  Eyes:     General: No scleral icterus. Cardiovascular:     Rate and Rhythm: Bradycardia present.  Pulmonary:     Effort: Pulmonary effort is normal. No respiratory distress.  Musculoskeletal:        General: Normal range of motion.  Neurological:     General: No focal deficit present.     Mental Status: She is alert and oriented to person, place, and time.  Psychiatric:        Attention and Perception: Attention and perception normal.        Mood and Affect: Mood is anxious and depressed. Affect is blunt.        Speech: Speech normal.        Behavior: Behavior normal. Behavior is cooperative.        Thought Content: Thought content normal.        Cognition and Memory: Cognition and memory normal.        Judgment: Judgment normal.    Review of Systems  Constitutional:  Negative for chills and fever.  Respiratory:  Negative for shortness of breath.   Cardiovascular:  Negative for chest pain and palpitations.  Gastrointestinal:  Negative for abdominal pain.  Neurological:  Negative for headaches.  Psychiatric/Behavioral:  Positive for depression. Negative for hallucinations and suicidal ideas. The patient is nervous/anxious. The patient does not have insomnia.   All other systems reviewed and are negative.  Blood pressure 116/67, pulse 77, temperature (!) 97.5 F (36.4 C), temperature source Oral, resp. rate 16, height 5\' 1"  (1.549 m), weight 61.2 kg, SpO2 99%. Body mass index is 25.51 kg/m.  Treatment Plan Summary: Daily contact with patient to assess and evaluate symptoms and progress in treatment, Medication management, and Plan    Bipolar affective disorder, depressed, severe: Depakote 500 mg BID Invega  3 mg daily Lipid panel WDL, TSH WDL, and A1C WDL & EKG awaiting results   Anxiety: Hydroxyzine 10 mg TID PRN   Nanine Means, NP 10/05/2022, 10:19 AM

## 2022-10-05 NOTE — Group Note (Signed)
[facility] LCSW Group Therapy Note   Group Date: 10/05/2022 Start Time: 1310 End Time: 1410   Type of Therapy/Topic:  Group Therapy:  Balance in Life  Participation Level:  Active   Description of Group:    This group will address the concept of balance and how it feels and looks when one is unbalanced. Patients will be encouraged to process areas in their lives that are out of balance, and identify reasons for remaining unbalanced. Facilitators will guide patients utilizing problem- solving interventions to address and correct the stressor making their life unbalanced. Understanding and applying boundaries will be explored and addressed for obtaining  and maintaining a balanced life. Patients will be encouraged to explore ways to assertively make their unbalanced needs known to significant others in their lives, using other group members and facilitator for support and feedback.  Therapeutic Goals: Patient will identify two or more emotions or situations they have that consume much of in their lives. Patient will identify signs/triggers that life has become out of balance:  Patient will identify two ways to set boundaries in order to achieve balance in their lives:  Patient will demonstrate ability to communicate their needs through discussion and/or role plays  Summary of Patient Progress: The patient attended group. The patient participated during the icebreaker. The patient stated that she has trouble with asking for help and not know when to ask for help. The patient shared life experiences as they related to topic.      Marshell Levan, LCSW

## 2022-10-05 NOTE — Progress Notes (Signed)
Patient alert and oriented x 4, affect is blunted thoughts are organized, she is interacting appropriately with peers and staff, complaint with medication regimen. Patient denies SI/HI/AVH  rates depression a 4/10 on a scale (0 low - 10 high ) patient was offered support and encouragement, 15 minutes safety checks maintained will continue to monitor.

## 2022-10-05 NOTE — Plan of Care (Signed)
  Problem: Safety: Goal: Ability to remain free from injury will improve Outcome: Progressing   Problem: Education: Goal: Emotional status will improve Outcome: Progressing  Patient appears less anxious and free from injury.

## 2022-10-06 DIAGNOSIS — F315 Bipolar disorder, current episode depressed, severe, with psychotic features: Secondary | ICD-10-CM | POA: Diagnosis not present

## 2022-10-06 NOTE — Progress Notes (Signed)
Patient alert and oriented x 4, affect is flat but brightens upon approach no distress noted interacting, she denies SI/HI/AVH, she rated  depression a 5/10 on a scale ( 0 low - 10 high ) patient was offered emotional support and encouragement, 15 minutes safety checks maintained will continue to monitor closely.

## 2022-10-06 NOTE — Group Note (Signed)
Date:  10/06/2022 Time:  7:12 PM  Group Topic/Focus:  Structured Activity Group    Participation Level:  Active  Participation Quality:  Appropriate and Attentive  Affect:  Appropriate  Cognitive:  Alert and Appropriate  Insight: Appropriate  Engagement in Group:  Engaged  Modes of Intervention:  Activity  Additional Comments:    Sherese Heyward A Keshia Weare 10/06/2022, 7:12 PM

## 2022-10-06 NOTE — Plan of Care (Signed)

## 2022-10-06 NOTE — Progress Notes (Addendum)
[facility] MD Progress Note  10/06/2022 12:39 PM Chloe [name]  MRN:  [id number]  Subjective:  Pt chart reviewed, discussed with interdisciplinary team, and seen on rounds. Today, she continues to report moderate depression and anxiety over her current situation of her unknown living situation, "Until things get straightened out".  No suicidal ideations.  Her sleep is "pretty good".  Appetite is "more than good".  Denies side effects from her medications.  Her urinary incontinence is "getting better."  Principal Problem: Bipolar affective disorder, depressed, severe, with psychotic behavior (HCC) Diagnosis: Principal Problem:   Bipolar affective disorder, depressed, severe, with psychotic behavior (HCC)  Total Time spent with patient:  25 minutes  Past Psychiatric History: bipolar d/o  Past Medical History:  Past Medical History:  Diagnosis Date   Anxiety    Bipolar disorder (HCC)    Breast mass, right    GERD (gastroesophageal reflux disease)    Hyperlipidemia    Post-operative nausea and vomiting    Smoker     Past Surgical History:  Procedure Laterality Date   ABDOMINAL HYSTERECTOMY     had nausea and vomiting post-op   BREAST LUMPECTOMY WITH RADIOACTIVE SEED LOCALIZATION Right 11/12/2016   Procedure: RIGHT BREAST LUMPECTOMY WITH RADIOACTIVE SEED LOCALIZATION;  Surgeon: Harriette Bouillon, MD;  Location: [facility];  Service: General;  Laterality: Right;   CHOLECYSTECTOMY     LAPAROSCOPIC LYSIS OF ADHESIONS     MYOMECTOMY     PLANTAR FASCIA SURGERY Left    Family History:  Family History  Problem Relation Age of Onset   Lung cancer Mother    Breast cancer Maternal Aunt    Hyperlipidemia Brother    Colon cancer Neg Hx    Esophageal cancer Neg Hx    Stomach cancer Neg Hx    Family Psychiatric  History: none Social History:  Social History   Substance and Sexual Activity  Alcohol Use Not Currently   Comment: social     Social History   Substance and  Sexual Activity  Drug Use No    Social History   Socioeconomic History   Marital status: Divorced    Spouse name: Not on file   Number of children: Not on file   Years of education: Not on file   Highest education level: Associate degree: occupational, Scientist, product/process development, or vocational program  Occupational History   Not on file  Tobacco Use   Smoking status: Every Day    Current packs/day: 0.50    Types: Cigarettes   Smokeless tobacco: Never  Vaping Use   Vaping status: Never Used  Substance and Sexual Activity   Alcohol use: Not Currently    Comment: social   Drug use: No   Sexual activity: Not Currently  Other Topics Concern   Not on file  Social History Narrative   Not on file   Social Determinants of Health   Financial Resource Strain: High Risk (03/26/2022)   Overall Financial Resource Strain (CARDIA)    Difficulty of Paying Living Expenses: Hard  Food Insecurity: No Food Insecurity (09/29/2022)   Hunger Vital Sign    Worried About Running Out of Food in the Last Year: Never true    Ran Out of Food in the Last Year: Never true  Recent Concern: Food Insecurity - Food Insecurity Present (07/01/2022)   Hunger Vital Sign    Worried About Running Out of Food in the Last Year: Often true    Ran Out of Food in the Last  Year: Often true  Transportation Needs: No Transportation Needs (09/29/2022)   PRAPARE - Administrator, Civil Service (Medical): No    Lack of Transportation (Non-Medical): No  Recent Concern: Transportation Needs - Unmet Transportation Needs (07/01/2022)   PRAPARE - Transportation    Lack of Transportation (Medical): No    Lack of Transportation (Non-Medical): Yes  Physical Activity: Inactive (03/26/2022)   Exercise Vital Sign    Days of Exercise per Week: 0 days    Minutes of Exercise per Session: 0 min  Stress: Stress Concern Present (03/26/2022)   [employer] of Occupational Health - Occupational Stress Questionnaire    Feeling of Stress :  Very much  Social Connections: Socially Isolated (03/26/2022)   Social Connection and Isolation Panel [NHANES]    Frequency of Communication with Friends and Family: Twice a week    Frequency of Social Gatherings with Friends and Family: Once a week    Attends Religious Services: Never    Database administrator or Organizations: No    Attends Engineer, structural: Never    Marital Status: Divorced   Sleep: Good  Appetite:  Good  Current Medications: Current Facility-Administered Medications  Medication Dose Route Frequency Provider Last Rate Last Admin   acetaminophen (TYLENOL) tablet 650 mg  650 mg Oral Q4H PRN Lewis, Jerene Pitch, NP       albuterol (VENTOLIN HFA) 108 (90 Base) MCG/ACT inhaler 2 puff  2 puff Inhalation Q4H PRN Oneta Rack, NP       alum & mag hydroxide-simeth (MAALOX/MYLANTA) 200-200-20 MG/5ML suspension 30 mL  30 mL Oral Q4H PRN Oneta Rack, NP       diphenhydrAMINE (BENADRYL) capsule 50 mg  50 mg Oral TID PRN Oneta Rack, NP       Or   diphenhydrAMINE (BENADRYL) injection 50 mg  50 mg Intramuscular TID PRN Oneta Rack, NP       divalproex (DEPAKOTE) DR tablet 500 mg  500 mg Oral Q12H Charm Rings, NP   500 mg at 10/06/22 0854   haloperidol (HALDOL) tablet 5 mg  5 mg Oral TID PRN Oneta Rack, NP       Or   haloperidol lactate (HALDOL) injection 5 mg  5 mg Intramuscular TID PRN Oneta Rack, NP       hydrOXYzine (ATARAX) tablet 10 mg  10 mg Oral TID PRN Oneta Rack, NP   10 mg at 10/04/22 2122   LORazepam (ATIVAN) tablet 2 mg  2 mg Oral TID PRN Oneta Rack, NP   2 mg at 10/05/22 2138   Or   LORazepam (ATIVAN) injection 2 mg  2 mg Intramuscular TID PRN Oneta Rack, NP       magnesium hydroxide (MILK OF MAGNESIA) suspension 30 mL  30 mL Oral Daily PRN Oneta Rack, NP       mometasone-formoterol (DULERA) 100-5 MCG/ACT inhaler 2 puff  2 puff Inhalation BID Oneta Rack, NP   2 puff at 10/06/22 0855   nicotine (NICODERM CQ  - dosed in mg/24 hours) patch 14 mg  14 mg Transdermal Daily Sarina Ill, DO   14 mg at 09/30/22 0905   ondansetron (ZOFRAN) tablet 4 mg  4 mg Oral Q8H PRN Oneta Rack, NP       paliperidone (INVEGA) 24 hr tablet 3 mg  3 mg Oral Daily Charm Rings, NP   3 mg at 10/06/22  0854   pantoprazole (PROTONIX) EC tablet 40 mg  40 mg Oral Daily Oneta Rack, NP   40 mg at 10/06/22 0854   pravastatin (PRAVACHOL) tablet 40 mg  40 mg Oral QPM Oneta Rack, NP   40 mg at 10/05/22 1720   vitamin A & D ointment   Topical PRN Charm Rings, NP   1 Application at 10/02/22 1638    Lab Results: No results found for this or any previous visit (from the past 48 hour(s)).  Blood Alcohol level:  Lab Results  Component Value Date   ETH <10 09/27/2022   ETH <10 11/24/2017    Metabolic Disorder Labs: Lab Results  Component Value Date   HGBA1C 5.4 10/01/2022   MPG 108 10/01/2022   MPG 102.54 11/30/2017   Lab Results  Component Value Date   PROLACTIN 12.4 11/30/2017   Lab Results  Component Value Date   CHOL 163 10/01/2022   TRIG 96 10/01/2022   HDL 51 10/01/2022   CHOLHDL 3.2 10/01/2022   VLDL 19 10/01/2022   LDLCALC 93 10/01/2022   LDLCALC 86 04/04/2022     Musculoskeletal: Strength & Muscle Tone: within normal limits Gait & Station: normal Patient leans: N/A   Psychiatric Specialty Exam: Physical Exam Vitals reviewed.  Constitutional:      Appearance: Normal appearance.  HENT:     Head: Normocephalic.     Nose: Nose normal.  Pulmonary:     Effort: Pulmonary effort is normal.  Musculoskeletal:        General: Normal range of motion.     Cervical back: Normal range of motion.  Neurological:     General: No focal deficit present.     Mental Status: She is alert and oriented to person, place, and time.  Psychiatric:        Attention and Perception: Attention and perception normal.        Mood and Affect: Mood is anxious and depressed.        Speech: Speech  normal.        Behavior: Behavior normal. Behavior is cooperative.        Thought Content: Thought content normal.        Cognition and Memory: Cognition and memory normal.        Judgment: Judgment is impulsive.       Review of Systems  Psychiatric/Behavioral:  Positive for depression and dysphoric mood. The patient is nervous/anxious.   All other systems reviewed and are negative.    Blood pressure (!) 115/44, pulse 73, temperature (!) 97.2 F (36.2 C), resp. rate (!) 21, height 5\' 1"  (1.549 m), weight 61.2 kg, SpO2 100%.Body mass index is 25.51 kg/m.  General Appearance: Casual  Eye Contact:  Fair  Speech:  Normal Rate  Volume:  Normal  Mood:  anxious and depression  Affect:  Congruent  Thought Process:  Coherent  Orientation:  Full (Time, Place, and Person)  Thought Content:  WDL  Suicidal Thoughts:  No  Homicidal Thoughts:  No  Memory:  Immediate;   Fair Recent;   Fair Remote;   Fair  Judgement:  Fair  Insight:  Lacking  Psychomotor Activity:  Normal  Concentration:  Concentration: Fair and Attention Span: Fair  Recall:  [employer] of Knowledge:  Fair  Language:  Good  Akathisia:  No  Handed:  Right  AIMS (if indicated):     Assets:  Leisure Time Physical Health Resilience Social Support  ADL's:  Intact  Cognition:  WNL  Sleep:        Physical Exam: Physical Exam Vitals and nursing note reviewed.  Constitutional:      General: She is not in acute distress.    Appearance: She is not ill-appearing, toxic-appearing or diaphoretic.  HENT:     Head: Normocephalic.     Nose: Nose normal.  Eyes:     General: No scleral icterus. Cardiovascular:     Rate and Rhythm: Bradycardia present.  Pulmonary:     Effort: Pulmonary effort is normal. No respiratory distress.  Musculoskeletal:        General: Normal range of motion.  Neurological:     General: No focal deficit present.     Mental Status: She is alert and oriented to person, place, and time.   Psychiatric:        Attention and Perception: Attention and perception normal.        Mood and Affect: Mood is anxious and depressed. Affect is blunt.        Speech: Speech normal.        Behavior: Behavior normal. Behavior is cooperative.        Thought Content: Thought content normal.        Cognition and Memory: Cognition and memory normal.        Judgment: Judgment normal.    Review of Systems  Constitutional:  Negative for chills and fever.  Respiratory:  Negative for shortness of breath.   Cardiovascular:  Negative for chest pain and palpitations.  Gastrointestinal:  Negative for abdominal pain.  Neurological:  Negative for headaches.  Psychiatric/Behavioral:  Positive for depression. Negative for hallucinations and suicidal ideas. The patient is nervous/anxious. The patient does not have insomnia.   All other systems reviewed and are negative.  Blood pressure 123/84, pulse 88, temperature 97.6 F (36.4 C), temperature source Oral, resp. rate 18, height 5\' 1"  (1.549 m), weight 61.2 kg, SpO2 97%. Body mass index is 25.51 kg/m.  Treatment Plan Summary: Daily contact with patient to assess and evaluate symptoms and progress in treatment, Medication management, and Plan    Bipolar affective disorder, depressed, severe: Depakote 500 mg BID Invega  3 mg daily Lipid panel WDL, TSH WDL, and A1C WDL & EKG with no issues   Anxiety: Hydroxyzine 10 mg TID PRN   Nanine Means, NP 10/06/2022, 12:39 PM

## 2022-10-06 NOTE — Progress Notes (Signed)
   10/06/22 0900  Psych Admission Type (Psych Patients Only)  Admission Status Voluntary/72 hour document signed  Date 72 hour document signed  10/04/22  Psychosocial Assessment  Patient Complaints Depression  Eye Contact None  Facial Expression Flat  Affect Depressed  Speech Logical/coherent  Interaction Assertive  Motor Activity Slow  Appearance/Hygiene Improved  Behavior Characteristics Cooperative  Mood Pleasant  Thought Process  Coherency WDL  Content WDL  Delusions None reported or observed  Perception WDL  Hallucination None reported or observed  Judgment Limited  Confusion None  Danger to Self  Current suicidal ideation? Denies  Agreement Not to Harm Self Yes  Description of Agreement verbal  Danger to Others  Danger to Others None reported or observed   Alert and oriented x4. Mood continues to be flat. Patient rates depression, anxiety and hopelessness 0/10. Denies pain, goal per self inventory sheet is, "to get out of [place]." Reports she slept well last night. Will continue q53min checks and monitor, verbal support and encouragement given.

## 2022-10-06 NOTE — Group Note (Unsigned)
Date:  10/06/2022 Time:  9:31 PM  Group Topic/Focus:  Wrap-Up Group:   The focus of this group is to help patients review their daily goal of treatment and discuss progress on daily workbooks.     Participation Level:  {BHH PARTICIPATION WUJWJ:19147}  Participation Quality:  {BHH PARTICIPATION QUALITY:22265}  Affect:  {BHH AFFECT:22266}  Cognitive:  {BHH COGNITIVE:22267}  Insight: {BHH Insight2:20797}  Engagement in Group:  {BHH ENGAGEMENT IN WGNFA:21308}  Modes of Intervention:  {BHH MODES OF INTERVENTION:22269}  Additional Comments:  ***  Belva Crome 10/06/2022, 9:31 PM

## 2022-10-06 NOTE — Group Note (Unsigned)
Date:  10/06/2022 Time:  9:34 PM  Group Topic/Focus:  Wrap-Up Group:   The focus of this group is to help patients review their daily goal of treatment and discuss progress on daily workbooks.     Participation Level:  {BHH PARTICIPATION AOZHY:86578}  Participation Quality:  {BHH PARTICIPATION QUALITY:22265}  Affect:  {BHH AFFECT:22266}  Cognitive:  {BHH COGNITIVE:22267}  Insight: {BHH Insight2:20797}  Engagement in Group:  {BHH ENGAGEMENT IN IONGE:95284}  Modes of Intervention:  {BHH MODES OF INTERVENTION:22269}  Additional Comments:  ***  Belva Crome 10/06/2022, 9:34 PM

## 2022-10-06 NOTE — Group Note (Signed)
Date:  10/06/2022 Time:  10:01 PM  Group Topic/Focus:  Wrap-Up Group:   The focus of this group is to help patients review their daily goal of treatment and discuss progress on daily workbooks.    Participation Level:  Active  Participation Quality:  Appropriate  Affect:  Appropriate  Cognitive:  Appropriate  Insight: Appropriate  Engagement in Group:  Supportive  Modes of Intervention:  Support  Additional Comments:     Belva Crome 10/06/2022, 10:01 PM

## 2022-10-06 NOTE — Plan of Care (Signed)
  Problem: Education: Goal: Emotional status will improve Outcome: Progressing  Patient is appropriate no distress note, she appears less anxious.

## 2022-10-07 DIAGNOSIS — F315 Bipolar disorder, current episode depressed, severe, with psychotic features: Secondary | ICD-10-CM | POA: Diagnosis not present

## 2022-10-07 NOTE — Group Note (Signed)
Date:  10/07/2022 Time:  10:50 AM  Group Topic/Focus:  Self Care:   The focus of this group is to help patients understand the importance of self-care in order to improve or restore emotional, physical, spiritual, interpersonal, and financial health.    Participation Level:  Active  Participation Quality:  Appropriate and Attentive  Affect:  Appropriate  Cognitive:  Alert and Appropriate  Insight: Appropriate  Engagement in Group:  Developing/Improving and Engaged  Modes of Intervention:  Activity  Additional Comments:    Kemba Hoppes 10/07/2022, 10:50 AM

## 2022-10-07 NOTE — Progress Notes (Signed)
[facility] MD Progress Note  10/07/2022 10:27 AM Chloe [name]  MRN:  [id number]  Subjective:  Pt chart reviewed, discussed with interdisciplinary team, and seen on rounds. Pt reports "ok" euthymic mood. Denies suicidal, homicidal ideations. Denies auditory visual hallucinations or paranoia. Reports fair sleep and good appetite. She is not sure where she is going to go after discharge. Encouraged pt to work with social work on discharge planning.  Principal Problem: Bipolar affective disorder, depressed, severe, with psychotic behavior (HCC)  Diagnosis: Principal Problem:   Bipolar affective disorder, depressed, severe, with psychotic behavior (HCC)  Total Time spent with patient:  25 minutes  Past Psychiatric History: bipolar d/o  Past Medical History:  Past Medical History:  Diagnosis Date   Anxiety    Bipolar disorder (HCC)    Breast mass, right    GERD (gastroesophageal reflux disease)    Hyperlipidemia    Post-operative nausea and vomiting    Smoker     Past Surgical History:  Procedure Laterality Date   ABDOMINAL HYSTERECTOMY     had nausea and vomiting post-op   BREAST LUMPECTOMY WITH RADIOACTIVE SEED LOCALIZATION Right 11/12/2016   Procedure: RIGHT BREAST LUMPECTOMY WITH RADIOACTIVE SEED LOCALIZATION;  Surgeon: Harriette Bouillon, MD;  Location: [facility];  Service: General;  Laterality: Right;   CHOLECYSTECTOMY     LAPAROSCOPIC LYSIS OF ADHESIONS     MYOMECTOMY     PLANTAR FASCIA SURGERY Left    Family History:  Family History  Problem Relation Age of Onset   Lung cancer Mother    Breast cancer Maternal Aunt    Hyperlipidemia Brother    Colon cancer Neg Hx    Esophageal cancer Neg Hx    Stomach cancer Neg Hx    Family Psychiatric  History: none Social History:  Social History   Substance and Sexual Activity  Alcohol Use Not Currently   Comment: social     Social History   Substance and Sexual Activity  Drug Use No    Social History    Socioeconomic History   Marital status: Divorced    Spouse name: Not on file   Number of children: Not on file   Years of education: Not on file   Highest education level: Associate degree: occupational, Scientist, product/process development, or vocational program  Occupational History   Not on file  Tobacco Use   Smoking status: Every Day    Current packs/day: 0.50    Types: Cigarettes   Smokeless tobacco: Never  Vaping Use   Vaping status: Never Used  Substance and Sexual Activity   Alcohol use: Not Currently    Comment: social   Drug use: No   Sexual activity: Not Currently  Other Topics Concern   Not on file  Social History Narrative   Not on file   Social Determinants of Health   Financial Resource Strain: High Risk (03/26/2022)   Overall Financial Resource Strain (CARDIA)    Difficulty of Paying Living Expenses: Hard  Food Insecurity: No Food Insecurity (09/29/2022)   Hunger Vital Sign    Worried About Running Out of Food in the Last Year: Never true    Ran Out of Food in the Last Year: Never true  Recent Concern: Food Insecurity - Food Insecurity Present (07/01/2022)   Hunger Vital Sign    Worried About Running Out of Food in the Last Year: Often true    Ran Out of Food in the Last Year: Often true  Transportation Needs: No Transportation Needs (  09/29/2022)   PRAPARE - Administrator, Civil Service (Medical): No    Lack of Transportation (Non-Medical): No  Recent Concern: Transportation Needs - Unmet Transportation Needs (07/01/2022)   PRAPARE - Transportation    Lack of Transportation (Medical): No    Lack of Transportation (Non-Medical): Yes  Physical Activity: Inactive (03/26/2022)   Exercise Vital Sign    Days of Exercise per Week: 0 days    Minutes of Exercise per Session: 0 min  Stress: Stress Concern Present (03/26/2022)   [employer] of Occupational Health - Occupational Stress Questionnaire    Feeling of Stress : Very much  Social Connections: Socially Isolated  (03/26/2022)   Social Connection and Isolation Panel [NHANES]    Frequency of Communication with Friends and Family: Twice a week    Frequency of Social Gatherings with Friends and Family: Once a week    Attends Religious Services: Never    Database administrator or Organizations: No    Attends Engineer, structural: Never    Marital Status: Divorced   Sleep: Fair  Appetite:  Good  Current Medications: Current Facility-Administered Medications  Medication Dose Route Frequency Provider Last Rate Last Admin   acetaminophen (TYLENOL) tablet 650 mg  650 mg Oral Q4H PRN Lewis, Jerene Pitch, NP       albuterol (VENTOLIN HFA) 108 (90 Base) MCG/ACT inhaler 2 puff  2 puff Inhalation Q4H PRN Oneta Rack, NP       alum & mag hydroxide-simeth (MAALOX/MYLANTA) 200-200-20 MG/5ML suspension 30 mL  30 mL Oral Q4H PRN Oneta Rack, NP       diphenhydrAMINE (BENADRYL) capsule 50 mg  50 mg Oral TID PRN Oneta Rack, NP       Or   diphenhydrAMINE (BENADRYL) injection 50 mg  50 mg Intramuscular TID PRN Oneta Rack, NP       divalproex (DEPAKOTE) DR tablet 500 mg  500 mg Oral Q12H Charm Rings, NP   500 mg at 10/07/22 1610   haloperidol (HALDOL) tablet 5 mg  5 mg Oral TID PRN Oneta Rack, NP       Or   haloperidol lactate (HALDOL) injection 5 mg  5 mg Intramuscular TID PRN Oneta Rack, NP       hydrOXYzine (ATARAX) tablet 10 mg  10 mg Oral TID PRN Oneta Rack, NP   10 mg at 10/04/22 2122   LORazepam (ATIVAN) tablet 2 mg  2 mg Oral TID PRN Oneta Rack, NP   2 mg at 10/05/22 2138   Or   LORazepam (ATIVAN) injection 2 mg  2 mg Intramuscular TID PRN Oneta Rack, NP       magnesium hydroxide (MILK OF MAGNESIA) suspension 30 mL  30 mL Oral Daily PRN Oneta Rack, NP       mometasone-formoterol (DULERA) 100-5 MCG/ACT inhaler 2 puff  2 puff Inhalation BID Oneta Rack, NP   2 puff at 10/07/22 9604   nicotine (NICODERM CQ - dosed in mg/24 hours) patch 14 mg  14 mg  Transdermal Daily Sarina Ill, DO   14 mg at 09/30/22 0905   ondansetron (ZOFRAN) tablet 4 mg  4 mg Oral Q8H PRN Oneta Rack, NP       paliperidone (INVEGA) 24 hr tablet 3 mg  3 mg Oral Daily Charm Rings, NP   3 mg at 10/07/22 0922   pantoprazole (PROTONIX) EC tablet 40 mg  40 mg Oral Daily Oneta Rack, NP   40 mg at 10/07/22 7829   pravastatin (PRAVACHOL) tablet 40 mg  40 mg Oral QPM Oneta Rack, NP   40 mg at 10/06/22 1743   vitamin A & D ointment   Topical PRN Charm Rings, NP   1 Application at 10/02/22 1638    Lab Results: No results found for this or any previous visit (from the past 48 hour(s)).  Blood Alcohol level:  Lab Results  Component Value Date   ETH <10 09/27/2022   ETH <10 11/24/2017    Metabolic Disorder Labs: Lab Results  Component Value Date   HGBA1C 5.4 10/01/2022   MPG 108 10/01/2022   MPG 102.54 11/30/2017   Lab Results  Component Value Date   PROLACTIN 12.4 11/30/2017   Lab Results  Component Value Date   CHOL 163 10/01/2022   TRIG 96 10/01/2022   HDL 51 10/01/2022   CHOLHDL 3.2 10/01/2022   VLDL 19 10/01/2022   LDLCALC 93 10/01/2022   LDLCALC 86 04/04/2022    Physical Findings: AIMS:  , ,  ,  ,    CIWA:    COWS:     Musculoskeletal: Strength & Muscle Tone: within normal limits Gait & Station: normal Patient leans: N/A  Psychiatric Specialty Exam:  Presentation  General Appearance:  Casual; Fairly Groomed  Eye Contact: Fair  Speech: Clear and Coherent; Normal Rate  Speech Volume: Normal  Handedness: Right   Mood and Affect  Mood: Euthymic ("I'm ok")  Affect: Blunt   Thought Process  Thought Processes: Coherent; Goal Directed; Linear  Descriptions of Associations:Intact  Orientation:Full (Time, Place and Person)  Thought Content:Logical  Hallucinations:Hallucinations: None  Ideas of Reference:None  Suicidal Thoughts:Suicidal Thoughts: No  Homicidal Thoughts:Homicidal  Thoughts: No   Sensorium  Memory: Immediate Fair  Judgment: Intact  Insight: Shallow   Executive Functions  Concentration: Fair  Attention Span: Fair  Recall: [employer] of Knowledge: Fair  Language: Fair   Psychomotor Activity  Psychomotor Activity: Psychomotor Activity: Normal   Assets  Assets: Communication Skills; Desire for Improvement; Financial Resources/Insurance; Housing; Resilience; Social Support   Sleep  Sleep: Sleep: Fair  Physical Exam: Physical Exam Constitutional:      General: She is not in acute distress.    Appearance: She is not ill-appearing, toxic-appearing or diaphoretic.  Eyes:     General: No scleral icterus. Cardiovascular:     Rate and Rhythm: Normal rate.  Pulmonary:     Effort: Pulmonary effort is normal. No respiratory distress.  Neurological:     Mental Status: She is alert and oriented to person, place, and time.  Psychiatric:        Attention and Perception: Attention and perception normal.        Mood and Affect: Mood normal. Affect is blunt.        Speech: Speech normal.        Behavior: Behavior normal. Behavior is cooperative.        Thought Content: Thought content normal.    Review of Systems  Constitutional:  Negative for chills and fever.  Respiratory:  Negative for shortness of breath.   Cardiovascular:  Negative for chest pain and palpitations.  Gastrointestinal:  Negative for abdominal pain.  Neurological:  Negative for headaches.  Psychiatric/Behavioral:  Negative for hallucinations and suicidal ideas. The patient does not have insomnia.    Blood pressure 105/70, pulse 63, temperature 97.7 F (36.5 C), temperature source Oral,  resp. rate 19, height 5\' 1"  (1.549 m), weight 61.2 kg, SpO2 95%. Body mass index is 25.51 kg/m.   Treatment Plan Summary: Daily contact with patient to assess and evaluate symptoms and progress in treatment, Medication management, and Plan    Bipolar affective  disorder, depressed, severe Depakote 500mg  oral every 12 hours  Invega 3mg  oral daily Lipid panel WDL, TSH WDL, and A1C WDL & EKG no issues  Anxiety Hydroxyzine 10mg  oral TID PRN  Lauree Chandler, NP 10/07/2022, 10:27 AM

## 2022-10-07 NOTE — Group Note (Signed)
Recreation Therapy Group Note   Group Topic:Problem Solving  Group Date: 10/07/2022 Start Time: 1000 End Time: 1055 Facilitators: Rosina Lowenstein, LRT, CTRS Location:  Craft Room  Group Description: Life Boat. Patients were given the scenario that they are on a boat that is about to become shipwrecked, leaving them stranded on an [place]. They are asked to make a list of 15 different items that they want to take with them when they are stranded on the [place]. Patients are asked to rank their items from most important to least important, #1 being the most important and #15 being the least. Patients will work individually for the first round to come up with 15 items and then pair up with a peer(s) to condense their list and come up with one list of 15 items between the two of them. Patients or LRT will read aloud the 15 different items to the group after each round. LRT facilitated post-activity processing to discuss how this activity can be used in daily life post discharge.   Goal Area(s) Addressed:  Patient will identify priorities, wants and needs. Patient will communicate with LRT and peers. Patient will work collectively as a Administrator, Civil Service. Patient will work on Product manager.    Affect/Mood: N/A   Participation Level: Did not attend    Clinical Observations/Individualized Feedback: [place] did not attend group.  Plan: Continue to engage patient in RT group sessions 2-3x/week.   Rosina Lowenstein, LRT, CTRS 10/07/2022 11:49 AM

## 2022-10-07 NOTE — Group Note (Signed)
Date:  10/07/2022 Time:  6:29 PM  Group Topic/Focus:  OUTDOOR RECREATION STRUCTURED ACTIVITY GROUP AND RAPPORT BUILDING.    Participation Level:  Minimal  Participation Quality:  Appropriate  Affect:  Appropriate  Cognitive:  Appropriate  Insight: Appropriate  Engagement in Group:  Limited  Modes of Intervention:  Exploration  Additional Comments:    Autumm Hattery 10/07/2022, 6:29 PM

## 2022-10-07 NOTE — Group Note (Unsigned)
Date:  10/07/2022 Time:  9:00 PM  Group Topic/Focus:  Making Healthy Choices:   The focus of this group is to help patients identify negative/unhealthy choices they were using prior to admission and identify positive/healthier coping strategies to replace them upon discharge.     Participation Level:  {BHH PARTICIPATION FAOZH:08657}  Participation Quality:  {BHH PARTICIPATION QUALITY:22265}  Affect:  {BHH AFFECT:22266}  Cognitive:  {BHH COGNITIVE:22267}  Insight: {BHH Insight2:20797}  Engagement in Group:  {BHH ENGAGEMENT IN QIONG:29528}  Modes of Intervention:  {BHH MODES OF INTERVENTION:22269}  Additional Comments:  ***  Lenore Cordia 10/07/2022, 9:00 PM

## 2022-10-07 NOTE — Plan of Care (Signed)
D- Patient alert and oriented. Affect is depressed and sad  Denies SI, HI, AVH, and pain.  A- Scheduled medications administered to patient, per MD orders. Support and encouragement provided.  Routine safety checks conducted every 15 minutes.  Patient informed to notify staff with problems or concerns. R- No adverse drug reactions noted. Patient contracts for safety at this time. Patient compliant with medications and treatment plan. Patient receptive, calm, and cooperative. Patient interacts well with others on the unit.  Patient remains safe at this time.

## 2022-10-08 DIAGNOSIS — F315 Bipolar disorder, current episode depressed, severe, with psychotic features: Secondary | ICD-10-CM | POA: Diagnosis not present

## 2022-10-08 NOTE — Progress Notes (Signed)
Nursing Shift Note:  1900-0700  Attended Evening Group: yes Medication Compliant:  yes Behavior: cooperative Sleep Quality: fair - got up to take tylenol due to back pain Significant Changes: none   10/08/22 0000  Psych Admission Type (Psych Patients Only)  Admission Status Voluntary  Psychosocial Assessment  Patient Complaints Depression  Eye Contact None  Facial Expression Flat  Affect Depressed  Speech Logical/coherent  Interaction Assertive  Motor Activity Slow  Appearance/Hygiene Improved  Behavior Characteristics Cooperative  Mood Pleasant  Thought Process  Coherency WDL  Content WDL  Delusions None reported or observed  Perception WDL  Hallucination None reported or observed  Judgment Limited  Confusion None  Danger to Self  Current suicidal ideation? Denies  Danger to Others  Danger to Others None reported or observed

## 2022-10-08 NOTE — Progress Notes (Signed)
Pt denies SI/HI/AVH and verbally agrees to approach staff if these become apparent or before harming themselves/others. Rates depression 4/10. Rates anxiety 5/10. Pt reports moderate improvement in symptoms since admission. Rates pain 0/10.   Scheduled medications administered to pt, per MD orders. RN provided support and encouragement to pt. Q15 min safety checks implemented and continued. Pt is safe on the unit. Plan of care on going and no other concerns expressed at this time.

## 2022-10-08 NOTE — Group Note (Signed)
Date:  10/08/2022 Time:  11:15 AM  Group Topic/Focus:  Goals Group:   The focus of this group is to help patients establish daily goals to achieve during treatment and discuss how the patient can incorporate goal setting into their daily lives to aide in recovery.    Participation Level:  Minimal  Participation Quality:  Appropriate  Affect:  Appropriate  Cognitive:  Alert  Insight: Appropriate  Engagement in Group:  Limited  Modes of Intervention:  Activity  Additional Comments:    Chloe [name] 10/08/2022, 11:15 AM

## 2022-10-08 NOTE — Progress Notes (Signed)
[facility] MD Progress Note  10/08/2022 11:51 AM Chloe [name]  MRN:  [id number]  Subjective:  Pt chart reviewed, discussed with interdisciplinary team, and seen on rounds. Reports mood is "ok". Denies suicidal, homicidal ideations. Denies auditory visual hallucinations or paranoia. Reports sleep and appetite have been fair. Reports she spoke with sister in law yesterday and did not receive an answer on where she would be going after discharge.   Principal Problem: Bipolar affective disorder, depressed, severe, with psychotic behavior (HCC) Diagnosis: Principal Problem:   Bipolar affective disorder, depressed, severe, with psychotic behavior (HCC)  Total Time spent with patient:  25 minutes  Past Psychiatric History: bipolar disorder  Past Medical History:  Past Medical History:  Diagnosis Date   Anxiety    Bipolar disorder (HCC)    Breast mass, right    GERD (gastroesophageal reflux disease)    Hyperlipidemia    Post-operative nausea and vomiting    Smoker     Past Surgical History:  Procedure Laterality Date   ABDOMINAL HYSTERECTOMY     had nausea and vomiting post-op   BREAST LUMPECTOMY WITH RADIOACTIVE SEED LOCALIZATION Right 11/12/2016   Procedure: RIGHT BREAST LUMPECTOMY WITH RADIOACTIVE SEED LOCALIZATION;  Surgeon: Harriette Bouillon, MD;  Location: [facility];  Service: General;  Laterality: Right;   CHOLECYSTECTOMY     LAPAROSCOPIC LYSIS OF ADHESIONS     MYOMECTOMY     PLANTAR FASCIA SURGERY Left    Family History:  Family History  Problem Relation Age of Onset   Lung cancer Mother    Breast cancer Maternal Aunt    Hyperlipidemia Brother    Colon cancer Neg Hx    Esophageal cancer Neg Hx    Stomach cancer Neg Hx    Family Psychiatric  History: none Social History:  Social History   Substance and Sexual Activity  Alcohol Use Not Currently   Comment: social     Social History   Substance and Sexual Activity  Drug Use No    Social History    Socioeconomic History   Marital status: Divorced    Spouse name: Not on file   Number of children: Not on file   Years of education: Not on file   Highest education level: Associate degree: occupational, Scientist, product/process development, or vocational program  Occupational History   Not on file  Tobacco Use   Smoking status: Every Day    Current packs/day: 0.50    Types: Cigarettes   Smokeless tobacco: Never  Vaping Use   Vaping status: Never Used  Substance and Sexual Activity   Alcohol use: Not Currently    Comment: social   Drug use: No   Sexual activity: Not Currently  Other Topics Concern   Not on file  Social History Narrative   Not on file   Social Determinants of Health   Financial Resource Strain: High Risk (03/26/2022)   Overall Financial Resource Strain (CARDIA)    Difficulty of Paying Living Expenses: Hard  Food Insecurity: No Food Insecurity (09/29/2022)   Hunger Vital Sign    Worried About Running Out of Food in the Last Year: Never true    Ran Out of Food in the Last Year: Never true  Recent Concern: Food Insecurity - Food Insecurity Present (07/01/2022)   Hunger Vital Sign    Worried About Running Out of Food in the Last Year: Often true    Ran Out of Food in the Last Year: Often true  Transportation Needs: No Transportation Needs (  09/29/2022)   PRAPARE - Administrator, Civil Service (Medical): No    Lack of Transportation (Non-Medical): No  Recent Concern: Transportation Needs - Unmet Transportation Needs (07/01/2022)   PRAPARE - Transportation    Lack of Transportation (Medical): No    Lack of Transportation (Non-Medical): Yes  Physical Activity: Inactive (03/26/2022)   Exercise Vital Sign    Days of Exercise per Week: 0 days    Minutes of Exercise per Session: 0 min  Stress: Stress Concern Present (03/26/2022)   [employer] of Occupational Health - Occupational Stress Questionnaire    Feeling of Stress : Very much  Social Connections: Socially Isolated  (03/26/2022)   Social Connection and Isolation Panel [NHANES]    Frequency of Communication with Friends and Family: Twice a week    Frequency of Social Gatherings with Friends and Family: Once a week    Attends Religious Services: Never    Database administrator or Organizations: No    Attends Engineer, structural: Never    Marital Status: Divorced   Sleep: Fair  Appetite:  Fair  Current Medications: Current Facility-Administered Medications  Medication Dose Route Frequency Provider Last Rate Last Admin   acetaminophen (TYLENOL) tablet 650 mg  650 mg Oral Q4H PRN Oneta Rack, NP   650 mg at 10/08/22 0506   albuterol (VENTOLIN HFA) 108 (90 Base) MCG/ACT inhaler 2 puff  2 puff Inhalation Q4H PRN Oneta Rack, NP       alum & mag hydroxide-simeth (MAALOX/MYLANTA) 200-200-20 MG/5ML suspension 30 mL  30 mL Oral Q4H PRN Oneta Rack, NP       diphenhydrAMINE (BENADRYL) capsule 50 mg  50 mg Oral TID PRN Oneta Rack, NP       Or   diphenhydrAMINE (BENADRYL) injection 50 mg  50 mg Intramuscular TID PRN Oneta Rack, NP       divalproex (DEPAKOTE) DR tablet 500 mg  500 mg Oral Q12H Charm Rings, NP   500 mg at 10/08/22 0844   haloperidol (HALDOL) tablet 5 mg  5 mg Oral TID PRN Oneta Rack, NP       Or   haloperidol lactate (HALDOL) injection 5 mg  5 mg Intramuscular TID PRN Oneta Rack, NP       hydrOXYzine (ATARAX) tablet 10 mg  10 mg Oral TID PRN Oneta Rack, NP   10 mg at 10/04/22 2122   LORazepam (ATIVAN) tablet 2 mg  2 mg Oral TID PRN Oneta Rack, NP   2 mg at 10/05/22 2138   Or   LORazepam (ATIVAN) injection 2 mg  2 mg Intramuscular TID PRN Oneta Rack, NP       magnesium hydroxide (MILK OF MAGNESIA) suspension 30 mL  30 mL Oral Daily PRN Oneta Rack, NP       mometasone-formoterol (DULERA) 100-5 MCG/ACT inhaler 2 puff  2 puff Inhalation BID Oneta Rack, NP   2 puff at 10/08/22 0845   nicotine (NICODERM CQ - dosed in mg/24 hours)  patch 14 mg  14 mg Transdermal Daily Sarina Ill, DO   14 mg at 09/30/22 0905   ondansetron (ZOFRAN) tablet 4 mg  4 mg Oral Q8H PRN Oneta Rack, NP       paliperidone (INVEGA) 24 hr tablet 3 mg  3 mg Oral Daily Charm Rings, NP   3 mg at 10/08/22 0844   pantoprazole (PROTONIX) EC  tablet 40 mg  40 mg Oral Daily Oneta Rack, NP   40 mg at 10/08/22 0844   pravastatin (PRAVACHOL) tablet 40 mg  40 mg Oral QPM Oneta Rack, NP   40 mg at 10/07/22 1827   vitamin A & D ointment   Topical PRN Charm Rings, NP   1 Application at 10/02/22 1638    Lab Results: No results found for this or any previous visit (from the past 48 hour(s)).  Blood Alcohol level:  Lab Results  Component Value Date   ETH <10 09/27/2022   ETH <10 11/24/2017    Metabolic Disorder Labs: Lab Results  Component Value Date   HGBA1C 5.4 10/01/2022   MPG 108 10/01/2022   MPG 102.54 11/30/2017   Lab Results  Component Value Date   PROLACTIN 12.4 11/30/2017   Lab Results  Component Value Date   CHOL 163 10/01/2022   TRIG 96 10/01/2022   HDL 51 10/01/2022   CHOLHDL 3.2 10/01/2022   VLDL 19 10/01/2022   LDLCALC 93 10/01/2022   LDLCALC 86 04/04/2022    Physical Findings: AIMS:  , ,  ,  ,    CIWA:    COWS:     Musculoskeletal: Strength & Muscle Tone: within normal limits Gait & Station: normal Patient leans: N/A  Psychiatric Specialty Exam:  Presentation  General Appearance:  Casual; Fairly Groomed  Eye Contact: Fair  Speech: Clear and Coherent; Normal Rate  Speech Volume: Normal  Handedness: Right   Mood and Affect  Mood: Euthymic ("ok")  Affect: Blunt   Thought Process  Thought Processes: Coherent; Goal Directed; Linear  Descriptions of Associations:Circumstantial (mildly circumstantial)  Orientation:Full (Time, Place and Person)  Thought Content:Logical  Hallucinations:Hallucinations: None  Ideas of Reference:None  Suicidal Thoughts:Suicidal  Thoughts: No  Homicidal Thoughts:Homicidal Thoughts: No   Sensorium  Memory: Immediate Fair  Judgment: Intact  Insight: Shallow   Executive Functions  Concentration: Fair  Attention Span: Fair  Recall: [employer] of Knowledge: Fair  Language: Fair   Psychomotor Activity  Psychomotor Activity: Psychomotor Activity: Normal   Assets  Assets: Communication Skills; Desire for Improvement; Financial Resources/Insurance; Housing; Resilience; Social Support   Sleep  Sleep: Sleep: Fair    Physical Exam: Physical Exam Constitutional:      General: She is not in acute distress.    Appearance: She is not ill-appearing, toxic-appearing or diaphoretic.  Eyes:     General: No scleral icterus. Cardiovascular:     Rate and Rhythm: Normal rate.  Pulmonary:     Effort: Pulmonary effort is normal. No respiratory distress.  Neurological:     Mental Status: She is alert and oriented to person, place, and time.  Psychiatric:        Attention and Perception: Attention and perception normal.        Mood and Affect: Mood normal. Affect is blunt.        Speech: Speech normal.        Behavior: Behavior normal. Behavior is cooperative.        Thought Content: Thought content normal.    Review of Systems  Constitutional:  Negative for chills and fever.  Respiratory:  Negative for shortness of breath.   Cardiovascular:  Negative for chest pain and palpitations.  Gastrointestinal:  Negative for abdominal pain.  Neurological:  Negative for headaches.  Psychiatric/Behavioral: Negative.    Denies suicidal, homicidal ideations. Denies auditory visual hallucinations or paranoia.  Blood pressure 126/68, pulse 66, temperature 97.8  F (36.6 C), temperature source Oral, resp. rate 19, height 5\' 1"  (1.549 m), weight 61.2 kg, SpO2 99%. Body mass index is 25.51 kg/m.   Treatment Plan Summary: Daily contact with patient to assess and evaluate symptoms and progress in  treatment, Medication management, and Plan    Bipolar affective disorder, depressed, severe Depakote 500mg  oral every 12 hours  Invega 3mg  oral daily Lipid panel WDL, TSH WDL, and A1C WDL & EKG no issues   Anxiety Hydroxyzine 10mg  oral TID PRN   Lauree Chandler, NP 10/08/2022, 11:51 AM

## 2022-10-08 NOTE — Group Note (Signed)
Date:  10/08/2022 Time:  5:15 PM  Group Topic/Focus:  Managing Feelings:   The focus of this group is to identify what feelings patients have difficulty handling and develop a plan to handle them in a healthier way upon discharge.    Participation Level:  Active  Participation Quality:  Appropriate and Attentive  Affect:  Appropriate  Cognitive:  Alert and Appropriate  Insight: Appropriate  Engagement in Group:  Developing/Improving, Engaged, and Improving  Modes of Intervention:  Activity, Discussion, Socialization, and Support  Additional Comments:    Rosaura Carpenter 10/08/2022, 5:15 PM

## 2022-10-08 NOTE — Plan of Care (Signed)
Patient stated that she is anxious about where to stay after discharge. Patient visible in the milieu this afternoon. Appropriate with staff & peers. Patient denies SI,HI and AVH. Appetite and energy level good. Compliant with medications. Support and encouragement given.

## 2022-10-08 NOTE — Group Note (Signed)
Recreation Therapy Group Note   Group Topic:General Recreation  Group Date: 10/08/2022 Start Time: 1000 End Time: 1100 Facilitators: Rosina Lowenstein, LRT, CTRS Location:  Courtyard  Group Description: Outdoor Recreation. Patients had the option to play basketball, corn hole or sit and listen to music while outside in the courtyard getting fresh air and sunlight. LRT and pts discussed things that they enjoy doing in their free time outside of the hospital.   Goal Area(s) Addressed: Patient will identify leisure interests.  Patient will practice healthy decision making. Patient will engage in recreation activity.   Affect/Mood: N/A   Participation Level: Did not attend    Clinical Observations/Individualized Feedback: Chloe [name] did not attend group.  Plan: Continue to engage patient in RT group sessions 2-3x/week.   Rosina Lowenstein, LRT, CTRS 10/08/2022 11:37 AM

## 2022-10-09 DIAGNOSIS — F315 Bipolar disorder, current episode depressed, severe, with psychotic features: Secondary | ICD-10-CM | POA: Diagnosis not present

## 2022-10-09 NOTE — BHH Counselor (Signed)
CSW met with the patient regarding placement.   CSW explained that if patient does not have placement that CSW can provide patient with resources on shelters.  CSW explained that if needed hospital can assist with transportation.  CSW and patient called patient's brother, Fayrene Fearing [phone] who confirmed that patient can not return to her home. He reports that his wife, patient's sister in law is in [place] taking papers out on patient.   He reports that he has patient's things and can bring to patient once she is settled.    He reports that he is out of town and unsure if items can be brought prior to patient leaving.  Penni Homans, MSW, LCSW 10/09/2022 3:40 PM

## 2022-10-09 NOTE — Progress Notes (Signed)
Patient is IVC'd to [facility] for Bipolar - depression.  She was kicked out of her family home d/t fighting with sister.  She is planning on being discharged on Friday to a shelter and is worried about not having medications or being able to pay for rx's on discharge. She states she did not sleep well last night and is worried it is from her [facility] inhaler. Patient has been calm, cooperative, interacting well with staff and peers. Denies S/I, H/I, and AVH.  Will continue to monitor.

## 2022-10-09 NOTE — Progress Notes (Signed)
Patient presents with sad, flat affect. Denies SI, HI, aVH. Complaints of pain to lower back. Prn given with partial relief. Isolative to self and room, forwards minimal. Calm and cooperative. Encouragement and support provided. Safety checks maintained. Medications given as prescribed. Pt receptive and remains safe on unit with q 15 min checks.

## 2022-10-09 NOTE — Group Note (Signed)
Date:  10/09/2022 Time:  9:43 PM  Group Topic/Focus:  Developing a Wellness Toolbox:   The focus of this group is to help patients develop a "wellness toolbox" with skills and strategies to promote recovery upon discharge.    Participation Level:  Active  Participation Quality:  Appropriate  Affect:  Appropriate  Cognitive:  Appropriate  Insight: Appropriate  Engagement in Group:  Engaged  Modes of Intervention:  Discussion  Additional Comments:   Lenore Cordia 10/09/2022, 9:43 PM

## 2022-10-09 NOTE — Plan of Care (Signed)
  Problem: Education: Goal: Knowledge of General Education information will improve Description: Including pain rating scale, medication(s)/side effects and non-pharmacologic comfort measures Outcome: Progressing   Problem: Health Behavior/Discharge Planning: Goal: Ability to manage health-related needs will improve Outcome: Progressing   Problem: Nutrition: Goal: Adequate nutrition will be maintained Outcome: Progressing   

## 2022-10-09 NOTE — Progress Notes (Signed)
[facility] MD Progress Note  10/09/2022 12:05 PM Chloe [name]  MRN:  [id number]  Subjective:  Pt chart reviewed, discussed with interdisciplinary team, and seen on rounds. Reports "ok" mood. Denies suicidal, homicidal ideations. Denies auditory visual hallucinations or paranoia. Reports sleep and appetite are "ok". Pt does not know where she will go after discharge. She states she will make calls to other people she can stay with. Social work to follow up today. Discussed discharge on Friday. She is open to the [facility].   Principal Problem: Bipolar affective disorder, depressed, severe, with psychotic behavior (HCC)  Diagnosis: Principal Problem:   Bipolar affective disorder, depressed, severe, with psychotic behavior (HCC)  Total Time spent with patient:  25 minutes  Past Psychiatric History: bipolar disorder  Past Medical History:  Past Medical History:  Diagnosis Date   Anxiety    Bipolar disorder (HCC)    Breast mass, right    GERD (gastroesophageal reflux disease)    Hyperlipidemia    Post-operative nausea and vomiting    Smoker     Past Surgical History:  Procedure Laterality Date   ABDOMINAL HYSTERECTOMY     had nausea and vomiting post-op   BREAST LUMPECTOMY WITH RADIOACTIVE SEED LOCALIZATION Right 11/12/2016   Procedure: RIGHT BREAST LUMPECTOMY WITH RADIOACTIVE SEED LOCALIZATION;  Surgeon: Harriette Bouillon, MD;  Location: [facility];  Service: General;  Laterality: Right;   CHOLECYSTECTOMY     LAPAROSCOPIC LYSIS OF ADHESIONS     MYOMECTOMY     PLANTAR FASCIA SURGERY Left    Family History:  Family History  Problem Relation Age of Onset   Lung cancer Mother    Breast cancer Maternal Aunt    Hyperlipidemia Brother    Colon cancer Neg Hx    Esophageal cancer Neg Hx    Stomach cancer Neg Hx    Family Psychiatric  History: none Social History:  Social History   Substance and Sexual Activity  Alcohol Use Not Currently   Comment: social     Social  History   Substance and Sexual Activity  Drug Use No    Social History   Socioeconomic History   Marital status: Divorced    Spouse name: Not on file   Number of children: Not on file   Years of education: Not on file   Highest education level: Associate degree: occupational, Scientist, product/process development, or vocational program  Occupational History   Not on file  Tobacco Use   Smoking status: Every Day    Current packs/day: 0.50    Types: Cigarettes   Smokeless tobacco: Never  Vaping Use   Vaping status: Never Used  Substance and Sexual Activity   Alcohol use: Not Currently    Comment: social   Drug use: No   Sexual activity: Not Currently  Other Topics Concern   Not on file  Social History Narrative   Not on file   Social Determinants of Health   Financial Resource Strain: High Risk (03/26/2022)   Overall Financial Resource Strain (CARDIA)    Difficulty of Paying Living Expenses: Hard  Food Insecurity: No Food Insecurity (09/29/2022)   Hunger Vital Sign    Worried About Running Out of Food in the Last Year: Never true    Ran Out of Food in the Last Year: Never true  Recent Concern: Food Insecurity - Food Insecurity Present (07/01/2022)   Hunger Vital Sign    Worried About Running Out of Food in the Last Year: Often true  Ran Out of Food in the Last Year: Often true  Transportation Needs: No Transportation Needs (09/29/2022)   PRAPARE - Administrator, Civil Service (Medical): No    Lack of Transportation (Non-Medical): No  Recent Concern: Transportation Needs - Unmet Transportation Needs (07/01/2022)   PRAPARE - Transportation    Lack of Transportation (Medical): No    Lack of Transportation (Non-Medical): Yes  Physical Activity: Inactive (03/26/2022)   Exercise Vital Sign    Days of Exercise per Week: 0 days    Minutes of Exercise per Session: 0 min  Stress: Stress Concern Present (03/26/2022)   [employer] of Occupational Health - Occupational Stress Questionnaire     Feeling of Stress : Very much  Social Connections: Socially Isolated (03/26/2022)   Social Connection and Isolation Panel [NHANES]    Frequency of Communication with Friends and Family: Twice a week    Frequency of Social Gatherings with Friends and Family: Once a week    Attends Religious Services: Never    Database administrator or Organizations: No    Attends Engineer, structural: Never    Marital Status: Divorced   Sleep: Fair  Appetite:  Fair  Current Medications: Current Facility-Administered Medications  Medication Dose Route Frequency Provider Last Rate Last Admin   acetaminophen (TYLENOL) tablet 650 mg  650 mg Oral Q4H PRN Oneta Rack, NP   650 mg at 10/08/22 0506   albuterol (VENTOLIN HFA) 108 (90 Base) MCG/ACT inhaler 2 puff  2 puff Inhalation Q4H PRN Oneta Rack, NP       alum & mag hydroxide-simeth (MAALOX/MYLANTA) 200-200-20 MG/5ML suspension 30 mL  30 mL Oral Q4H PRN Oneta Rack, NP       diphenhydrAMINE (BENADRYL) capsule 50 mg  50 mg Oral TID PRN Oneta Rack, NP       Or   diphenhydrAMINE (BENADRYL) injection 50 mg  50 mg Intramuscular TID PRN Oneta Rack, NP       divalproex (DEPAKOTE) DR tablet 500 mg  500 mg Oral Q12H Charm Rings, NP   500 mg at 10/09/22 0856   haloperidol (HALDOL) tablet 5 mg  5 mg Oral TID PRN Oneta Rack, NP       Or   haloperidol lactate (HALDOL) injection 5 mg  5 mg Intramuscular TID PRN Oneta Rack, NP       hydrOXYzine (ATARAX) tablet 10 mg  10 mg Oral TID PRN Oneta Rack, NP   10 mg at 10/04/22 2122   LORazepam (ATIVAN) tablet 2 mg  2 mg Oral TID PRN Oneta Rack, NP   2 mg at 10/05/22 2138   Or   LORazepam (ATIVAN) injection 2 mg  2 mg Intramuscular TID PRN Oneta Rack, NP       magnesium hydroxide (MILK OF MAGNESIA) suspension 30 mL  30 mL Oral Daily PRN Oneta Rack, NP       mometasone-formoterol (DULERA) 100-5 MCG/ACT inhaler 2 puff  2 puff Inhalation BID Oneta Rack, NP   2  puff at 10/09/22 0855   nicotine (NICODERM CQ - dosed in mg/24 hours) patch 14 mg  14 mg Transdermal Daily Sarina Ill, DO   14 mg at 09/30/22 0905   ondansetron (ZOFRAN) tablet 4 mg  4 mg Oral Q8H PRN Oneta Rack, NP       paliperidone (INVEGA) 24 hr tablet 3 mg  3 mg Oral Daily  Charm Rings, NP   3 mg at 10/09/22 0856   pantoprazole (PROTONIX) EC tablet 40 mg  40 mg Oral Daily Oneta Rack, NP   40 mg at 10/09/22 0855   pravastatin (PRAVACHOL) tablet 40 mg  40 mg Oral QPM Oneta Rack, NP   40 mg at 10/08/22 1742   vitamin A & D ointment   Topical PRN Charm Rings, NP   1 Application at 10/02/22 1638    Lab Results: No results found for this or any previous visit (from the past 48 hour(s)).  Blood Alcohol level:  Lab Results  Component Value Date   ETH <10 09/27/2022   ETH <10 11/24/2017    Metabolic Disorder Labs: Lab Results  Component Value Date   HGBA1C 5.4 10/01/2022   MPG 108 10/01/2022   MPG 102.54 11/30/2017   Lab Results  Component Value Date   PROLACTIN 12.4 11/30/2017   Lab Results  Component Value Date   CHOL 163 10/01/2022   TRIG 96 10/01/2022   HDL 51 10/01/2022   CHOLHDL 3.2 10/01/2022   VLDL 19 10/01/2022   LDLCALC 93 10/01/2022   LDLCALC 86 04/04/2022    Physical Findings: AIMS:  , ,  ,  ,    CIWA:    COWS:     Musculoskeletal: Strength & Muscle Tone: within normal limits Gait & Station: normal Patient leans: N/A  Psychiatric Specialty Exam:  Presentation  General Appearance:  Casual; Fairly Groomed  Eye Contact: Fair  Speech: Clear and Coherent; Normal Rate  Speech Volume: Normal  Handedness: Right   Mood and Affect  Mood: -- ("ok")  Affect: Blunt   Thought Process  Thought Processes: Coherent; Goal Directed; Linear  Descriptions of Associations:Circumstantial (mildly cirumstantial)  Orientation:Full (Time, Place and Person)  Thought Content:Logical  Hallucinations:Hallucinations:  None  Ideas of Reference:None  Suicidal Thoughts:Suicidal Thoughts: No  Homicidal Thoughts:Homicidal Thoughts: No   Sensorium  Memory: Immediate Fair  Judgment: Intact  Insight: Shallow   Executive Functions  Concentration: Fair  Attention Span: Fair  Recall: [employer] of Knowledge: Fair  Language: Fair   Psychomotor Activity  Psychomotor Activity: Psychomotor Activity: Normal   Assets  Assets: Communication Skills; Desire for Improvement; Financial Resources/Insurance; Resilience; Social Support   Sleep  Sleep: Sleep: Fair    Physical Exam: Physical Exam Constitutional:      General: She is not in acute distress.    Appearance: She is not ill-appearing, toxic-appearing or diaphoretic.  Eyes:     General: No scleral icterus. Cardiovascular:     Rate and Rhythm: Bradycardia present.  Pulmonary:     Effort: Pulmonary effort is normal. No respiratory distress.  Neurological:     Mental Status: She is alert and oriented to person, place, and time.  Psychiatric:        Attention and Perception: Attention and perception normal.        Mood and Affect: Mood normal. Affect is blunt.        Speech: Speech normal.        Behavior: Behavior normal. Behavior is cooperative.        Thought Content: Thought content normal.    Review of Systems  Constitutional:  Negative for chills and fever.  Respiratory:  Negative for shortness of breath.   Cardiovascular:  Negative for chest pain and palpitations.  Gastrointestinal:  Negative for abdominal pain.  Neurological:  Negative for headaches.   Blood pressure (!) 104/52, pulse (!) 57,  temperature 97.9 F (36.6 C), resp. rate (!) 22, height 5\' 1"  (1.549 m), weight 61.2 kg, SpO2 100%. Body mass index is 25.51 kg/m.   Treatment Plan Summary: Daily contact with patient to assess and evaluate symptoms and progress in treatment, Medication management, and Plan    Bipolar affective disorder, depressed,  severe Depakote 500mg  oral every 12 hours  Invega 3mg  oral daily Lipid panel WDL, TSH WDL, and A1C WDL & EKG no issues   Anxiety Hydroxyzine 10mg  oral TID PRN  Lauree Chandler, NP 10/09/2022, 12:05 PM

## 2022-10-09 NOTE — Group Note (Signed)
Recreation Therapy Group Note   Group Topic:Relaxation  Group Date: 10/09/2022 Start Time: 1000 End Time: 1100 Facilitators: Rosina Lowenstein, LRT, CTRS Location: Courtyard  Group Description: Meditation. LRT and patients discussed what they know about meditation and mindfulness. LRT played a Deep Breathing Meditation exercise script for patients to follow along to. LRT and patients discussed how meditation and deep breathing can be used as a coping skill post--discharge. After the meditation, LRT took song requests from pts to listen to while getting fresh air and sunlight.    Goal Area(s) Addressed: Patient will practice using relaxation technique. Patient will identify a new coping skill.  Patient will follow multistep directions to reduce anxiety and stress.   Affect/Mood: Appropriate and Flat   Participation Level: Active and Engaged   Participation Quality: Independent   Behavior: Appropriate, Calm, and Cooperative   Speech/Thought Process: Coherent   Insight: Good   Judgement: Good   Modes of Intervention: Activity   Patient Response to Interventions:  Attentive, Engaged, Interested , and Receptive   Education Outcome:  Acknowledges education   Clinical Observations/Individualized Feedback: Gentrie was active in their participation of session activities and group discussion. Pt identified "I am more calm but now I am more aware of the pain in my back and neck." Pt shared that RN was aware of pain. Pt requested songs by [facility] while outside. Pt interacted well with LRT and peers duration of session.    Plan: Continue to engage patient in RT group sessions 2-3x/week.   Rosina Lowenstein, LRT, CTRS 10/09/2022 11:58 AM

## 2022-10-09 NOTE — Group Note (Signed)
Date:  10/09/2022 Time:  4:06 PM  Group Topic/Focus:  Goals Group:   The focus of this group is to help patients establish daily goals to achieve during treatment and discuss how the patient can incorporate goal setting into their daily lives to aide in recovery.  Progressive Muscle Relaxation   Participation Level:  Active  Participation Quality:  Appropriate and Attentive  Affect:  Appropriate  Cognitive:  Appropriate  Insight: Appropriate  Engagement in Group:  Engaged  Modes of Intervention:  Activity, Discussion, Education, and Support  Additional Comments:    Taevin Mcferran A Kahlee Metivier 10/09/2022, 4:06 PM

## 2022-10-09 NOTE — Group Note (Unsigned)
Date:  10/09/2022 Time:  1:34 AM  Group Topic/Focus:  Wrap-Up Group:   The focus of this group is to help patients review their daily goal of treatment and discuss progress on daily workbooks.     Participation Level:  {BHH PARTICIPATION UEAVW:09811}  Participation Quality:  {BHH PARTICIPATION QUALITY:22265}  Affect:  {BHH AFFECT:22266}  Cognitive:  {BHH COGNITIVE:22267}  Insight: {BHH Insight2:20797}  Engagement in Group:  {BHH ENGAGEMENT IN BJYNW:29562}  Modes of Intervention:  {BHH MODES OF INTERVENTION:22269}  Additional Comments:  ***  [name],Chloe Donahey E 10/09/2022, 1:34 AM

## 2022-10-09 NOTE — Plan of Care (Signed)
°  Problem: Education: °Goal: Knowledge of General Education information will improve °Description: Including pain rating scale, medication(s)/side effects and non-pharmacologic comfort measures °Outcome: Progressing °  °Problem: Health Behavior/Discharge Planning: °Goal: Ability to manage health-related needs will improve °Outcome: Progressing °  °Problem: Clinical Measurements: °Goal: Will remain free from infection °Outcome: Progressing °  °Problem: Activity: °Goal: Risk for activity intolerance will decrease °Outcome: Progressing °  °Problem: Nutrition: °Goal: Adequate nutrition will be maintained °Outcome: Progressing °  °Problem: Coping: °Goal: Level of anxiety will decrease °Outcome: Progressing °  °

## 2022-10-09 NOTE — Group Note (Signed)
Date:  10/09/2022 Time:  6:58 PM  Group Topic/Focus:  OUTDOOR RECREATION WELLNESS THROUGH MUSIC AND STRUCTURED ACTIVITY WHILE BUILDING THERAPEUTIC RAPPORT.    Participation Level:  Active  Participation Quality:  Appropriate and Attentive  Affect:  Appropriate  Cognitive:  Alert and Appropriate  Insight: Appropriate  Engagement in Group:  Developing/Improving, Engaged, and Supportive  Modes of Intervention:  Activity, Discussion, and Rapport Building  Additional Comments:    Rosaura Carpenter 10/09/2022, 6:58 PM

## 2022-10-10 DIAGNOSIS — F315 Bipolar disorder, current episode depressed, severe, with psychotic features: Secondary | ICD-10-CM | POA: Diagnosis not present

## 2022-10-10 NOTE — Group Note (Signed)
[facility] LCSW Group Therapy Note   Group Date: 10/10/2022 Start Time: 1310 End Time: 1400   Type of Therapy/Topic:  Group Therapy:  Balance in Life  Participation Level:  Active   Description of Group:    This group will address the concept of balance and how it feels and looks when one is unbalanced. Patients will be encouraged to process areas in their lives that are out of balance, and identify reasons for remaining unbalanced. Facilitators will guide patients utilizing problem- solving interventions to address and correct the stressor making their life unbalanced. Understanding and applying boundaries will be explored and addressed for obtaining  and maintaining a balanced life. Patients will be encouraged to explore ways to assertively make their unbalanced needs known to significant others in their lives, using other group members and facilitator for support and feedback.  Therapeutic Goals: Patient will identify two or more emotions or situations they have that consume much of in their lives. Patient will identify signs/triggers that life has become out of balance:  Patient will identify two ways to set boundaries in order to achieve balance in their lives:  Patient will demonstrate ability to communicate their needs through discussion and/or role plays  Summary of Patient Progress: Patient was present for the entirety of the group process. She shared that for her medication means balance. She shared that she is being discharged tomorrow and worries about going to shelter. Anxiety as part of the change process was normalized. Pt appeared open and receptive to feedback/comments from both peers and facilitator.    Therapeutic Modalities:   Cognitive Behavioral Therapy Solution-Focused Therapy Assertiveness Training   Glenis Smoker, LCSW

## 2022-10-10 NOTE — Group Note (Signed)
Date:  10/10/2022 Time:  4:22 PM  Group Topic/Focus:  Activity Group:  The purpose of this group is to promote activity and also get some fresh air outside in the courtyard.    Participation Level:  Active  Participation Quality:  Appropriate  Affect:  Appropriate  Cognitive:  Appropriate  Insight: Appropriate  Engagement in Group:  Engaged  Modes of Intervention:  Activity  Additional Comments:    Chloe [name] 10/10/2022, 4:22 PM

## 2022-10-10 NOTE — BH IP Treatment Plan (Signed)
Interdisciplinary Treatment and Diagnostic Plan Update  10/10/2022 Time of Session: 9:00AM GALILEE SCHNETZER MRN: [id number]  Principal Diagnosis: Bipolar affective disorder, depressed, severe, with psychotic behavior (HCC)  Secondary Diagnoses: Principal Problem:   Bipolar affective disorder, depressed, severe, with psychotic behavior (HCC)   Current Medications:  Current Facility-Administered Medications  Medication Dose Route Frequency Provider Last Rate Last Admin   acetaminophen (TYLENOL) tablet 650 mg  650 mg Oral Q4H PRN Oneta Rack, NP   650 mg at 10/09/22 2057   albuterol (VENTOLIN HFA) 108 (90 Base) MCG/ACT inhaler 2 puff  2 puff Inhalation Q4H PRN Oneta Rack, NP       alum & mag hydroxide-simeth (MAALOX/MYLANTA) 200-200-20 MG/5ML suspension 30 mL  30 mL Oral Q4H PRN Oneta Rack, NP       diphenhydrAMINE (BENADRYL) capsule 50 mg  50 mg Oral TID PRN Oneta Rack, NP       Or   diphenhydrAMINE (BENADRYL) injection 50 mg  50 mg Intramuscular TID PRN Oneta Rack, NP       divalproex (DEPAKOTE) DR tablet 500 mg  500 mg Oral Q12H Charm Rings, NP   500 mg at 10/10/22 6295   haloperidol (HALDOL) tablet 5 mg  5 mg Oral TID PRN Oneta Rack, NP       Or   haloperidol lactate (HALDOL) injection 5 mg  5 mg Intramuscular TID PRN Oneta Rack, NP       hydrOXYzine (ATARAX) tablet 10 mg  10 mg Oral TID PRN Oneta Rack, NP   10 mg at 10/04/22 2122   LORazepam (ATIVAN) tablet 2 mg  2 mg Oral TID PRN Oneta Rack, NP   2 mg at 10/05/22 2138   Or   LORazepam (ATIVAN) injection 2 mg  2 mg Intramuscular TID PRN Oneta Rack, NP       magnesium hydroxide (MILK OF MAGNESIA) suspension 30 mL  30 mL Oral Daily PRN Oneta Rack, NP       mometasone-formoterol (DULERA) 100-5 MCG/ACT inhaler 2 puff  2 puff Inhalation BID Oneta Rack, NP   2 puff at 10/10/22 0844   nicotine (NICODERM CQ - dosed in mg/24 hours) patch 14 mg  14 mg Transdermal Daily Sarina Ill, DO   14 mg at 09/30/22 0905   ondansetron (ZOFRAN) tablet 4 mg  4 mg Oral Q8H PRN Oneta Rack, NP       paliperidone (INVEGA) 24 hr tablet 3 mg  3 mg Oral Daily Charm Rings, NP   3 mg at 10/10/22 0844   pantoprazole (PROTONIX) EC tablet 40 mg  40 mg Oral Daily Oneta Rack, NP   40 mg at 10/10/22 0844   pravastatin (PRAVACHOL) tablet 40 mg  40 mg Oral QPM Oneta Rack, NP   40 mg at 10/09/22 1739   vitamin A & D ointment   Topical PRN Charm Rings, NP   1 Application at 10/02/22 1638   PTA Medications: Medications Prior to Admission  Medication Sig Dispense Refill Last Dose   Iloperidone (FANAPT) 6 MG TABS Take 1 tablet (6 mg total) by mouth 2 (two) times daily. 60 tablet 3 Past Month   lamoTRIgine (LAMICTAL) 200 MG tablet Take 1 tablet (200 mg total) by mouth 2 (two) times daily. 60 tablet 3 Past Month   omeprazole (PRILOSEC) 20 MG capsule Take 1 capsule (20 mg total) by mouth daily. 30  capsule 2 Past Month   pravastatin (PRAVACHOL) 40 MG tablet Take 1 tablet (40 mg total) by mouth daily. 90 tablet 1 Past Month   albuterol (PROVENTIL HFA) 108 (90 Base) MCG/ACT inhaler Inhale 2 puffs into the lungs every 4 (four) hours as needed for wheezing or shortness of breath. (Patient not taking: Reported on 09/03/2022) 6.7 g 1    cetirizine (ZYRTEC ALLERGY) 10 MG tablet Take 1 tablet (10 mg total) by mouth daily as needed for allergies. 100 tablet 1    fluticasone-salmeterol (ADVAIR) 100-50 MCG/ACT AEPB Inhale 1 puff into the lungs 2 (two) times daily. (Patient not taking: Reported on 09/03/2022) 60 each 3     Patient Stressors: Marital or family conflict    Patient Strengths: Capable of independent living  Forensic psychologist fund of knowledge   Treatment Modalities: Medication Management, Group therapy, Case management,  1 to 1 session with clinician, Psychoeducation, Recreational therapy.   Physician Treatment Plan for Primary Diagnosis: Bipolar  affective disorder, depressed, severe, with psychotic behavior (HCC) Long Term Goal(s): Improvement in symptoms so as ready for discharge   Short Term Goals: Ability to identify changes in lifestyle to reduce recurrence of condition will improve Ability to verbalize feelings will improve Ability to disclose and discuss suicidal ideas Ability to demonstrate self-control will improve Ability to identify and develop effective coping behaviors will improve Ability to maintain clinical measurements within normal limits will improve Compliance with prescribed medications will improve Ability to identify triggers associated with substance abuse/mental health issues will improve  Medication Management: Evaluate patient's response, side effects, and tolerance of medication regimen.  Therapeutic Interventions: 1 to 1 sessions, Unit Group sessions and Medication administration.  Evaluation of Outcomes: Progressing  Physician Treatment Plan for Secondary Diagnosis: Principal Problem:   Bipolar affective disorder, depressed, severe, with psychotic behavior (HCC)  Long Term Goal(s): Improvement in symptoms so as ready for discharge   Short Term Goals: Ability to identify changes in lifestyle to reduce recurrence of condition will improve Ability to verbalize feelings will improve Ability to disclose and discuss suicidal ideas Ability to demonstrate self-control will improve Ability to identify and develop effective coping behaviors will improve Ability to maintain clinical measurements within normal limits will improve Compliance with prescribed medications will improve Ability to identify triggers associated with substance abuse/mental health issues will improve     Medication Management: Evaluate patient's response, side effects, and tolerance of medication regimen.  Therapeutic Interventions: 1 to 1 sessions, Unit Group sessions and Medication administration.  Evaluation of Outcomes:  Progressing   RN Treatment Plan for Primary Diagnosis: Bipolar affective disorder, depressed, severe, with psychotic behavior (HCC) Long Term Goal(s): Knowledge of disease and therapeutic regimen to maintain health will improve  Short Term Goals: Ability to demonstrate self-control, Ability to participate in decision making will improve, Ability to verbalize feelings will improve, Ability to disclose and discuss suicidal ideas, Ability to identify and develop effective coping behaviors will improve, and Compliance with prescribed medications will improve  Medication Management: RN will administer medications as ordered by provider, will assess and evaluate patient's response and provide education to patient for prescribed medication. RN will report any adverse and/or side effects to prescribing provider.  Therapeutic Interventions: 1 on 1 counseling sessions, Psychoeducation, Medication administration, Evaluate responses to treatment, Monitor vital signs and CBGs as ordered, Perform/monitor CIWA, COWS, AIMS and Fall Risk screenings as ordered, Perform wound care treatments as ordered.  Evaluation of Outcomes: Progressing   LCSW Treatment Plan for  Primary Diagnosis: Bipolar affective disorder, depressed, severe, with psychotic behavior (HCC) Long Term Goal(s): Safe transition to appropriate next level of care at discharge, Engage patient in therapeutic group addressing interpersonal concerns.  Short Term Goals: Engage patient in aftercare planning with referrals and resources, Increase social support, Increase ability to appropriately verbalize feelings, Increase emotional regulation, Facilitate acceptance of mental health diagnosis and concerns, and Increase skills for wellness and recovery  Therapeutic Interventions: Assess for all discharge needs, 1 to 1 time with Social worker, Explore available resources and support systems, Assess for adequacy in community support network, Educate family and  significant other(s) on suicide prevention, Complete Psychosocial Assessment, Interpersonal group therapy.  Evaluation of Outcomes: Not Met   Progress in Treatment: Attending groups: Yes. Participating in groups: Yes. Taking medication as prescribed: Yes. Toleration medication: Yes. Family/Significant other contact made: No, will contact:  once permission has been granted. Patient understands diagnosis: Yes. Discussing patient identified problems/goals with staff: Yes. Medical problems stabilized or resolved: Yes. Denies suicidal/homicidal ideation: Yes. Issues/concerns per patient self-inventory: No. Other: none  New problem(s) identified: No, Describe:  none  New Short Term/Long Term Goal(s): detox, elimination of symptoms of psychosis, medication management for mood stabilization; elimination of SI thoughts; development of comprehensive mental wellness/sobriety plan.   Patient Goals:  Update 10/10/2022:  No changes at this time.   Discharge Plan or Barriers: Update 10/10/2022:  Patient's brother reports that patient can not return to his home.  He reports that his wife "is in [place] now about what happened".  He reports that he is out of town and unable to bring the patient her things. He reports that his wife has limitations to being about to bring the clothes, he reports that they can also bring items to where the patient is going.  CSW and patient have discussed going to [facility].    Reason for Continuation of Hospitalization: Anxiety Depression Medication stabilization Suicidal ideation  Estimated Length of Stay:  1-7 days  Last 3 [place] Suicide Severity Risk Score: Flowsheet Row Admission (Current) from 09/28/2022 in [facility] ED from 09/27/2022 in [facility] at [facility] Office Visit from 03/26/2022 in [facility]  C-SSRS RISK CATEGORY No Risk No Risk No Risk       Last PHQ 2/9 Scores:     07/01/2022    5:10 PM 04/04/2022    9:49 AM 03/26/2022   11:18 AM  Depression screen PHQ 2/9  Decreased Interest 2  0  Down, Depressed, Hopeless 2 2 2   PHQ - 2 Score 4 2 2   Altered sleeping 2 1 0  Tired, decreased energy 2 2   Change in appetite 3 2   Feeling bad or failure about yourself  3 3   Trouble concentrating 3 3   Moving slowly or fidgety/restless 3 3   Suicidal thoughts 1 0   PHQ-9 Score 21 16 2     Scribe for Treatment Team: Harden Mo, LCSW 10/10/2022 1:02 PM

## 2022-10-10 NOTE — Progress Notes (Signed)
[facility] MD Progress Note  10/10/2022 9:49 AM Chloe [name]  MRN:  [id number]  Subjective:  Pt chart reviewed, discussed with interdisciplinary team, and seen on rounds. Reports "I'm fine". Denies suicidal, homicidal ideations. Denies auditory visual hallucinations or paranoia. Reports sleep and appetite have been fair. We discussed discharge tomorrow and she verbalized understanding.  Principal Problem: Bipolar affective disorder, depressed, severe, with psychotic behavior (HCC) Diagnosis: Principal Problem:   Bipolar affective disorder, depressed, severe, with psychotic behavior (HCC)  Total Time spent with patient:  25 minutes  Past Psychiatric History: bipolar disorder  Past Medical History:  Past Medical History:  Diagnosis Date   Anxiety    Bipolar disorder (HCC)    Breast mass, right    GERD (gastroesophageal reflux disease)    Hyperlipidemia    Post-operative nausea and vomiting    Smoker     Past Surgical History:  Procedure Laterality Date   ABDOMINAL HYSTERECTOMY     had nausea and vomiting post-op   BREAST LUMPECTOMY WITH RADIOACTIVE SEED LOCALIZATION Right 11/12/2016   Procedure: RIGHT BREAST LUMPECTOMY WITH RADIOACTIVE SEED LOCALIZATION;  Surgeon: Harriette Bouillon, MD;  Location: [facility];  Service: General;  Laterality: Right;   CHOLECYSTECTOMY     LAPAROSCOPIC LYSIS OF ADHESIONS     MYOMECTOMY     PLANTAR FASCIA SURGERY Left    Family History:  Family History  Problem Relation Age of Onset   Lung cancer Mother    Breast cancer Maternal Aunt    Hyperlipidemia Brother    Colon cancer Neg Hx    Esophageal cancer Neg Hx    Stomach cancer Neg Hx    Family Psychiatric  History: none Social History:  Social History   Substance and Sexual Activity  Alcohol Use Not Currently   Comment: social     Social History   Substance and Sexual Activity  Drug Use No    Social History   Socioeconomic History   Marital status: Divorced    Spouse  name: Not on file   Number of children: Not on file   Years of education: Not on file   Highest education level: Associate degree: occupational, Scientist, product/process development, or vocational program  Occupational History   Not on file  Tobacco Use   Smoking status: Every Day    Current packs/day: 0.50    Types: Cigarettes   Smokeless tobacco: Never  Vaping Use   Vaping status: Never Used  Substance and Sexual Activity   Alcohol use: Not Currently    Comment: social   Drug use: No   Sexual activity: Not Currently  Other Topics Concern   Not on file  Social History Narrative   Not on file   Social Determinants of Health   Financial Resource Strain: High Risk (03/26/2022)   Overall Financial Resource Strain (CARDIA)    Difficulty of Paying Living Expenses: Hard  Food Insecurity: No Food Insecurity (09/29/2022)   Hunger Vital Sign    Worried About Running Out of Food in the Last Year: Never true    Ran Out of Food in the Last Year: Never true  Recent Concern: Food Insecurity - Food Insecurity Present (07/01/2022)   Hunger Vital Sign    Worried About Running Out of Food in the Last Year: Often true    Ran Out of Food in the Last Year: Often true  Transportation Needs: No Transportation Needs (09/29/2022)   PRAPARE - Administrator, Civil Service (Medical): No  Lack of Transportation (Non-Medical): No  Recent Concern: Transportation Needs - Unmet Transportation Needs (07/01/2022)   PRAPARE - Transportation    Lack of Transportation (Medical): No    Lack of Transportation (Non-Medical): Yes  Physical Activity: Inactive (03/26/2022)   Exercise Vital Sign    Days of Exercise per Week: 0 days    Minutes of Exercise per Session: 0 min  Stress: Stress Concern Present (03/26/2022)   [employer] of Occupational Health - Occupational Stress Questionnaire    Feeling of Stress : Very much  Social Connections: Socially Isolated (03/26/2022)   Social Connection and Isolation Panel [NHANES]     Frequency of Communication with Friends and Family: Twice a week    Frequency of Social Gatherings with Friends and Family: Once a week    Attends Religious Services: Never    Database administrator or Organizations: No    Attends Engineer, structural: Never    Marital Status: Divorced   Sleep: fair  Appetite:  Fair  Current Medications: Current Facility-Administered Medications  Medication Dose Route Frequency Provider Last Rate Last Admin   acetaminophen (TYLENOL) tablet 650 mg  650 mg Oral Q4H PRN Oneta Rack, NP   650 mg at 10/09/22 2057   albuterol (VENTOLIN HFA) 108 (90 Base) MCG/ACT inhaler 2 puff  2 puff Inhalation Q4H PRN Oneta Rack, NP       alum & mag hydroxide-simeth (MAALOX/MYLANTA) 200-200-20 MG/5ML suspension 30 mL  30 mL Oral Q4H PRN Oneta Rack, NP       diphenhydrAMINE (BENADRYL) capsule 50 mg  50 mg Oral TID PRN Oneta Rack, NP       Or   diphenhydrAMINE (BENADRYL) injection 50 mg  50 mg Intramuscular TID PRN Oneta Rack, NP       divalproex (DEPAKOTE) DR tablet 500 mg  500 mg Oral Q12H Charm Rings, NP   500 mg at 10/10/22 0844   haloperidol (HALDOL) tablet 5 mg  5 mg Oral TID PRN Oneta Rack, NP       Or   haloperidol lactate (HALDOL) injection 5 mg  5 mg Intramuscular TID PRN Oneta Rack, NP       hydrOXYzine (ATARAX) tablet 10 mg  10 mg Oral TID PRN Oneta Rack, NP   10 mg at 10/04/22 2122   LORazepam (ATIVAN) tablet 2 mg  2 mg Oral TID PRN Oneta Rack, NP   2 mg at 10/05/22 2138   Or   LORazepam (ATIVAN) injection 2 mg  2 mg Intramuscular TID PRN Oneta Rack, NP       magnesium hydroxide (MILK OF MAGNESIA) suspension 30 mL  30 mL Oral Daily PRN Oneta Rack, NP       mometasone-formoterol (DULERA) 100-5 MCG/ACT inhaler 2 puff  2 puff Inhalation BID Oneta Rack, NP   2 puff at 10/10/22 0844   nicotine (NICODERM CQ - dosed in mg/24 hours) patch 14 mg  14 mg Transdermal Daily Sarina Ill, DO    14 mg at 09/30/22 0905   ondansetron (ZOFRAN) tablet 4 mg  4 mg Oral Q8H PRN Oneta Rack, NP       paliperidone (INVEGA) 24 hr tablet 3 mg  3 mg Oral Daily Charm Rings, NP   3 mg at 10/10/22 0844   pantoprazole (PROTONIX) EC tablet 40 mg  40 mg Oral Daily Oneta Rack, NP   40 mg at  10/10/22 0844   pravastatin (PRAVACHOL) tablet 40 mg  40 mg Oral QPM Oneta Rack, NP   40 mg at 10/09/22 1739   vitamin A & D ointment   Topical PRN Charm Rings, NP   1 Application at 10/02/22 1638    Lab Results: No results found for this or any previous visit (from the past 48 hour(s)).  Blood Alcohol level:  Lab Results  Component Value Date   ETH <10 09/27/2022   ETH <10 11/24/2017    Metabolic Disorder Labs: Lab Results  Component Value Date   HGBA1C 5.4 10/01/2022   MPG 108 10/01/2022   MPG 102.54 11/30/2017   Lab Results  Component Value Date   PROLACTIN 12.4 11/30/2017   Lab Results  Component Value Date   CHOL 163 10/01/2022   TRIG 96 10/01/2022   HDL 51 10/01/2022   CHOLHDL 3.2 10/01/2022   VLDL 19 10/01/2022   LDLCALC 93 10/01/2022   LDLCALC 86 04/04/2022    Physical Findings: AIMS:  , ,  ,  ,    CIWA:    COWS:     Musculoskeletal: Strength & Muscle Tone: within normal limits Gait & Station: normal Patient leans: N/A  Psychiatric Specialty Exam:  Presentation  General Appearance:  Casual; Fairly Groomed  Eye Contact: Fair  Speech: Clear and Coherent; Normal Rate  Speech Volume: Normal  Handedness: Right   Mood and Affect  Mood: -- ("I'm fine")  Affect: Blunt   Thought Process  Thought Processes: Coherent  Descriptions of Associations:Circumstantial (mildly circumstantial)  Orientation:Full (Time, Place and Person)  Thought Content:Logical  Hallucinations:Hallucinations: None  Ideas of Reference:None  Suicidal Thoughts:Suicidal Thoughts: No  Homicidal Thoughts:Homicidal Thoughts: No   Sensorium  Memory: Immediate  Fair  Judgment: Intact  Insight: Shallow   Executive Functions  Concentration: Fair  Attention Span: Fair  Recall: [employer] of Knowledge: Fair  Language: Fair   Psychomotor Activity  Psychomotor Activity: Psychomotor Activity: Normal   Assets  Assets: Communication Skills; Desire for Improvement; Financial Resources/Insurance; Resilience; Social Support   Sleep  Sleep: Sleep: Fair    Physical Exam: Physical Exam Constitutional:      General: She is not in acute distress.    Appearance: She is not ill-appearing, toxic-appearing or diaphoretic.  Eyes:     General: No scleral icterus. Cardiovascular:     Rate and Rhythm: Normal rate.  Pulmonary:     Effort: Pulmonary effort is normal. No respiratory distress.  Neurological:     Mental Status: She is alert and oriented to person, place, and time.  Psychiatric:        Attention and Perception: Attention and perception normal.        Mood and Affect: Mood normal. Affect is blunt.        Speech: Speech normal.        Behavior: Behavior normal. Behavior is cooperative.        Thought Content: Thought content normal.    Review of Systems  Constitutional:  Negative for chills and fever.  Respiratory:  Negative for shortness of breath.   Cardiovascular:  Negative for chest pain and palpitations.  Gastrointestinal:  Negative for abdominal pain.  Neurological:  Negative for headaches.  Psychiatric/Behavioral: Negative.     Blood pressure 109/60, pulse 64, temperature 97.6 F (36.4 C), temperature source Oral, resp. rate 17, height 5\' 1"  (1.549 m), weight 61.2 kg, SpO2 99%. Body mass index is 25.51 kg/m.  Treatment Plan Summary: Daily  contact with patient to assess and evaluate symptoms and progress in treatment, Medication management, and Plan    Bipolar affective disorder, depressed, severe Depakote 500mg  oral every 12 hours  Invega 3mg  oral daily Lipid panel WDL, TSH WDL, and A1C WDL & EKG no  issues   Anxiety Hydroxyzine 10mg  oral TID PRN  Expected discharge tomorrow  Lauree Chandler, NP 10/10/2022, 9:49 AM

## 2022-10-10 NOTE — Plan of Care (Signed)

## 2022-10-10 NOTE — Progress Notes (Signed)
Patient presents with flat affect but brightens on approach. Complains of pain  in back from sitting all day. Prn given. Pt currently asleep resting, eyes closed in no distress. Denies SI, HI, AVH. Anxious about discharge on tomorrow. Encouragement and support provided. Safety checks maintained. Medications given as prescribed. Pt receptive and remains safe on unit with q 15 min checks.

## 2022-10-10 NOTE — Group Note (Signed)
Date:  10/10/2022 Time:  11:33 PM  Group Topic/Focus:  Crisis Planning:   The purpose of this group is to help patients create a crisis plan for use upon discharge or in the future, as needed.    Participation Level:  Active  Participation Quality:  Appropriate  Affect:  Appropriate  Cognitive:  Appropriate  Insight: Appropriate  Engagement in Group:  Engaged  Modes of Intervention:  Discussion  Additional Comments:    Lenore Cordia 10/10/2022, 11:33 PM

## 2022-10-10 NOTE — [facility] Counselor (Signed)
CSW checked on status of female bed availability for [facility].   CSW was informed that there are no beds available.   Penni Homans, MSW, LCSW 10/10/2022 2:32 PM

## 2022-10-10 NOTE — Plan of Care (Signed)
Patient states that she is " little worried " about discharge tomorrow. Patient hopes her family will bring her phone and cloths. Patient appropriate with staff & peers. Visible in the milieu. Denies SI,HI and AVH. Appetite and energy level good. ADLs maintained. Support and encouragement given.

## 2022-10-10 NOTE — Group Note (Signed)
Date:  10/10/2022 Time:  10:07 AM  Group Topic/Focus:  Goals Group:   The focus of this group is to help patients establish daily goals to achieve during treatment and discuss how the patient can incorporate goal setting into their daily lives to aide in recovery.    Participation Level:  Active  Participation Quality:  Appropriate  Affect:  Appropriate  Cognitive:  Appropriate  Insight: Appropriate  Engagement in Group:  Engaged  Modes of Intervention:  Discussion, Education, and Support  Additional Comments:    Wilford Corner 10/10/2022, 10:07 AM

## 2022-10-10 NOTE — Group Note (Signed)
Recreation Therapy Group Note   Group Topic:Goal Setting  Group Date: 10/10/2022 Start Time: 1000 End Time: 1120 Facilitators: Rosina Lowenstein, LRT, CTRS Location:  Craft Room  Group Description: Vision Board. Patients were given many different magazines, a glue stick, markers, and a piece of cardstock paper. LRT and pts discussed the importance of having goals in life. LRT and pts discussed the difference between short-term and long-term goals, as well as what a SMART goal is. LRT encouraged pts to create a vision board, with images they picked and then cut out with safety scissors from the magazine, for themselves, that capture their short and long-term goals. LRT encouraged pts to show and explain their vision board to the group.   Goal Area(s) Addressed:  Patient will gain knowledge of short vs. long term goals.  Patient will identify goals for themselves. Patient will practice setting SMART goals. Patient will verbalize their goals to LRT and peers.   Affect/Mood: Appropriate   Participation Level: Active and Engaged   Participation Quality: Independent   Behavior: Appropriate, Calm, and Cooperative   Speech/Thought Process: Coherent   Insight: Good and Improved   Judgement: Good   Modes of Intervention: Art   Patient Response to Interventions:  Attentive, Engaged, Interested , and Receptive   Education Outcome:  Acknowledges education   Clinical Observations/Individualized Feedback: Chloe [name] was active in their participation of session activities and group discussion. Pt identified "I want to meditate more since that's helpful, I want to plant more butterfly bushes and flowers and look at them" as her goals. Pt interacted well with LRT and peers duration of session.   Plan: Continue to engage patient in RT group sessions 2-3x/week.   Rosina Lowenstein, LRT, CTRS 10/10/2022 12:27 PM

## 2022-10-11 ENCOUNTER — Other Ambulatory Visit: Payer: Self-pay

## 2022-10-11 ENCOUNTER — Ambulatory Visit (HOSPITAL_COMMUNITY)
Admission: EM | Admit: 2022-10-11 | Discharge: 2022-10-11 | Disposition: A | Discharge status: Home / Self Care | Payer: Medicaid Other | Attending: Urology | Admitting: Urology

## 2022-10-11 DIAGNOSIS — F315 Bipolar disorder, current episode depressed, severe, with psychotic features: Secondary | ICD-10-CM | POA: Diagnosis not present

## 2022-10-11 DIAGNOSIS — Z5901 Sheltered homelessness: Secondary | ICD-10-CM | POA: Insufficient documentation

## 2022-10-11 DIAGNOSIS — F314 Bipolar disorder, current episode depressed, severe, without psychotic features: Secondary | ICD-10-CM

## 2022-10-11 DIAGNOSIS — F250 Schizoaffective disorder, bipolar type: Secondary | ICD-10-CM | POA: Insufficient documentation

## 2022-10-11 MED ORDER — DIVALPROEX SODIUM 500 MG PO DR TAB
500.0000 mg | DELAYED_RELEASE_TABLET | Freq: Two times a day (BID) | ORAL | 0 refills | Status: DC
  Filled 2022-10-14: qty 24, 12d supply, fill #0
  Filled 2022-10-14: qty 6, 3d supply, fill #0

## 2022-10-11 MED ORDER — PALIPERIDONE ER 3 MG PO TB24
3.0000 mg | ORAL_TABLET | Freq: Every day | ORAL | 0 refills | Status: DC
  Filled 2022-10-14: qty 30, 30d supply, fill #0

## 2022-10-11 MED ORDER — PALIPERIDONE ER 3 MG PO TB24
3.0000 mg | ORAL_TABLET | Freq: Every day | ORAL | 0 refills | Status: DC
  Filled 2022-10-11: qty 30, 30d supply, fill #0

## 2022-10-11 MED ORDER — HYDROXYZINE HCL 10 MG PO TABS
10.0000 mg | ORAL_TABLET | Freq: Three times a day (TID) | ORAL | 0 refills | Status: DC | PRN
  Filled 2022-10-14: qty 30, 10d supply, fill #0

## 2022-10-11 NOTE — Group Note (Signed)
Recreation Therapy Group Note   Group Topic:Leisure Education  Group Date: 10/11/2022 Start Time: 1000 End Time: 1105 Facilitators: Rosina Lowenstein, LRT, CTRS Location:  Craft Room  Group Description: Leisure. Patients were given the option to choose from coloring mandalas, singing karaoke, journaling, or playing cards. LRT and pts discussed the meaning of leisure, the importance of participating in leisure during their free time/when they're outside of the hospital, as well as how our leisure interests can also serve as coping skills. Pt identified two leisure interests and shared with the group.   Goal Area(s) Addressed:  Patient will identify a current leisure interest.  Patient will learn the definition of "leisure". Patient will practice making a positive decision. Patient will have the opportunity to try a new leisure activity. Patient will communicate with peers and LRT.    Affect/Mood: Appropriate and Flat   Participation Level: Moderate   Participation Quality: Independent   Behavior: Calm and Cooperative   Speech/Thought Process: Coherent   Insight: Fair   Judgement: Good   Modes of Intervention: Activity   Patient Response to Interventions:  Receptive   Education Outcome:  Acknowledges education   Clinical Observations/Individualized Feedback: Chloe [name] was mostly active in their participation of session activities and group discussion. Pt came late to group. Pt chose to sit and listen to music while in group. Pt interacted well with LRT and peers duration of session.   Plan: Continue to engage patient in RT group sessions 2-3x/week.   Rosina Lowenstein, LRT, CTRS 10/11/2022 12:27 PM

## 2022-10-11 NOTE — Progress Notes (Signed)
D- Patient alert and oriented. Affect/mood. Denies SI, HI, AVH, and pain. Quotes. Goal. She states her goal is to be comfortable inter acting with others and she feels she has made progress with her goals and things are getting better each day A- Scheduled medications administered to patient, per MD orders. Support and encouragement provided.  Routine safety checks conducted every 15 minutes.  Patient informed to notify staff with problems or concerns. R- No adverse drug reactions noted. Patient contracts for safety at this time. Patient compliant with medications and treatment plan. Patient receptive, calm, and cooperative. Patient interacts well with others on the unit.  Pt will be discharged from [facility] unit today.Patient remains safe at this time.

## 2022-10-11 NOTE — ED Provider Notes (Signed)
Behavioral Health Urgent Care Medical Screening Exam  Patient Name: Chloe [name] MRN: [id number] Date of Evaluation: 10/11/22 Chief Complaint:  "I'm hungry and I need somewhere to sleep" Diagnosis:  Final diagnoses:  Schizoaffective disorder, bipolar type (HCC)    History of Present illness: TRINATI CIHLAR is a 57 y.o. female Bipolar affective disorder. Patient presented to [facility] seeking shelter and food to eat.   Of note, patient was discharges from inpatient psychiatry at [facility] earlier today, 10/11/2022. (Admitted to [facility] 09/28/22-10/11/22).  Patient was evaluated face-to-face and her chart was reviewed by this nurse practitioner. Patient reports she was previously residing with her brother and his wife. She says she and her sister in law argue frequently and she does not want to return to her brother's home. She says she was discharged today from [facility] and transported to a homeless shelter. She says she does not want to sleep in a shelter and would like to be admitted to [facility] to sleep and eat. She denies any acute psychiatric or medical complaint. She denies suicidal and homicidal ideation, paranoia, hallucination, and substance abuse.    During assessment, patient is alert and oriented x 4.  Patient is slightly irritable but cooperative. She is speaking in a clear tone of voice at moderate rate and pace with good eye contact.  Patient's mood is irritable and her affect is congruent with her mood.  Patient's thought content is coherent and relevant.  Objectively, no evidence of mania, psychosis, or delusional thought content noted during interaction with patient.  Flowsheet Row Admission (Discharged) from 09/28/2022 in [facility] INPATIENT BEHAVIORAL MEDICINE ED from 09/27/2022 in [facility] Emergency Department at [facility] Office Visit from 03/26/2022 in [facility]  C-SSRS RISK CATEGORY No Risk No Risk No Risk       Psychiatric Specialty  Exam  Presentation  General Appearance:Appropriate for Environment  Eye Contact:Good  Speech:Clear and Coherent  Speech Volume:Normal  Handedness:Right   Mood and Affect  Mood: Irritable  Affect: Congruent   Thought Process  Thought Processes: Coherent  Descriptions of Associations:Intact  Orientation:Full (Time, Place and Person)  Thought Content:WDL  Diagnosis of Schizophrenia or Schizoaffective disorder in past: No data recorded  Hallucinations:None  Ideas of Reference:None  Suicidal Thoughts:No  Homicidal Thoughts:No   Sensorium  Memory: Immediate Good; Recent Fair; Remote Fair  Judgment: Poor  Insight: Poor   Executive Functions  Concentration: Good  Attention Span: Good  Recall: Fair  Fund of Knowledge: Good  Language: Good   Psychomotor Activity  Psychomotor Activity: Normal Other (comment) No   Assets  Assets: Physical Health; Desire for Improvement   Sleep  Sleep: Good  Number of hours:  8   Physical Exam: Physical Exam Vitals and nursing note reviewed.  Constitutional:      General: She is not in acute distress.    Appearance: She is well-developed. She is not ill-appearing.  HENT:     Head: Normocephalic and atraumatic.  Eyes:     Conjunctiva/sclera: Conjunctivae normal.  Cardiovascular:     Rate and Rhythm: Normal rate.  Pulmonary:     Effort: Pulmonary effort is normal.  Musculoskeletal:        General: Normal range of motion.     Cervical back: Normal range of motion.  Neurological:     Mental Status: She is alert and oriented to person, place, and time.  Psychiatric:        Attention and Perception: Attention and perception normal.  She is attentive. She does not perceive auditory or visual hallucinations.        Mood and Affect: Affect normal. Mood is anxious.        Speech: Speech normal.        Behavior: Agitated: irritable. Behavior is cooperative.        Thought Content: Thought content  normal. Thought content is not paranoid or delusional. Thought content does not include homicidal or suicidal ideation. Thought content does not include homicidal or suicidal plan.    Review of Systems  Constitutional: Negative.   HENT: Negative.    Eyes: Negative.   Respiratory: Negative.    Cardiovascular: Negative.   Gastrointestinal: Negative.   Genitourinary: Negative.   Musculoskeletal: Negative.   Skin: Negative.   Neurological: Negative.   Endo/Heme/Allergies: Negative.   Psychiatric/Behavioral:  The patient is nervous/anxious.    Blood pressure 137/66, pulse 68, temperature 98.1 F (36.7 C), temperature source Oral, resp. rate 18, SpO2 98%. There is no height or weight on file to calculate BMI.  Musculoskeletal: Strength & Muscle Tone: within normal limits Gait & Station: normal Patient leans: Right   [facility] MSE Discharge Disposition for Follow up and Recommendations: Based on my evaluation the patient does not appear to have an emergency medical condition and can be discharged with resources and follow up care in outpatient services for Medication Management and Individual Therapy   Maricela Bo, NP 10/11/2022, 8:24 PM

## 2022-10-11 NOTE — Discharge Summary (Signed)
Physician Discharge Summary Note  Patient:  Chloe [name] is an 57 y.o., female MRN:  [id number] DOB:  07-26-1965 Patient phone:  [phone] (home)  Patient address:   [address] [place] [address],  Total Time spent with patient: 45 minutes  Date of Admission:  09/28/2022 Date of Discharge: 10/11/2022  Reason for Admission:  mania with aggression  Principal Problem: Bipolar affective disorder, depressed, severe, with psychotic behavior (HCC) Discharge Diagnoses: Principal Problem:   Bipolar affective disorder, depressed, severe, with psychotic behavior (HCC)   Past Psychiatric History: bipolar disorder  Past Medical History:  Past Medical History:  Diagnosis Date   Anxiety    Bipolar disorder (HCC)    Breast mass, right    GERD (gastroesophageal reflux disease)    Hyperlipidemia    Post-operative nausea and vomiting    Smoker     Past Surgical History:  Procedure Laterality Date   ABDOMINAL HYSTERECTOMY     had nausea and vomiting post-op   BREAST LUMPECTOMY WITH RADIOACTIVE SEED LOCALIZATION Right 11/12/2016   Procedure: RIGHT BREAST LUMPECTOMY WITH RADIOACTIVE SEED LOCALIZATION;  Surgeon: Harriette Bouillon, MD;  Location: [facility];  Service: General;  Laterality: Right;   CHOLECYSTECTOMY     LAPAROSCOPIC LYSIS OF ADHESIONS     MYOMECTOMY     PLANTAR FASCIA SURGERY Left    Family History:  Family History  Problem Relation Age of Onset   Lung cancer Mother    Breast cancer Maternal Aunt    Hyperlipidemia Brother    Colon cancer Neg Hx    Esophageal cancer Neg Hx    Stomach cancer Neg Hx    Family Psychiatric  History: none Social History:  Social History   Substance and Sexual Activity  Alcohol Use Not Currently   Comment: social     Social History   Substance and Sexual Activity  Drug Use No    Social History   Socioeconomic History   Marital status: Divorced    Spouse name: Not on file   Number of children: Not on file    Years of education: Not on file   Highest education level: Associate degree: occupational, Scientist, product/process development, or vocational program  Occupational History   Not on file  Tobacco Use   Smoking status: Every Day    Current packs/day: 0.50    Types: Cigarettes   Smokeless tobacco: Never  Vaping Use   Vaping status: Never Used  Substance and Sexual Activity   Alcohol use: Not Currently    Comment: social   Drug use: No   Sexual activity: Not Currently  Other Topics Concern   Not on file  Social History Narrative   Not on file   Social Determinants of Health   Financial Resource Strain: High Risk (03/26/2022)   Overall Financial Resource Strain (CARDIA)    Difficulty of Paying Living Expenses: Hard  Food Insecurity: No Food Insecurity (09/29/2022)   Hunger Vital Sign    Worried About Running Out of Food in the Last Year: Never true    Ran Out of Food in the Last Year: Never true  Recent Concern: Food Insecurity - Food Insecurity Present (07/01/2022)   Hunger Vital Sign    Worried About Running Out of Food in the Last Year: Often true    Ran Out of Food in the Last Year: Often true  Transportation Needs: No Transportation Needs (09/29/2022)   PRAPARE - Administrator, Civil Service (Medical): No  Lack of Transportation (Non-Medical): No  Recent Concern: Transportation Needs - Unmet Transportation Needs (07/01/2022)   PRAPARE - Transportation    Lack of Transportation (Medical): No    Lack of Transportation (Non-Medical): Yes  Physical Activity: Inactive (03/26/2022)   Exercise Vital Sign    Days of Exercise per Week: 0 days    Minutes of Exercise per Session: 0 min  Stress: Stress Concern Present (03/26/2022)   [employer] of Occupational Health - Occupational Stress Questionnaire    Feeling of Stress : Very much  Social Connections: Socially Isolated (03/26/2022)   Social Connection and Isolation Panel [NHANES]    Frequency of Communication with Friends and Family: Twice a  week    Frequency of Social Gatherings with Friends and Family: Once a week    Attends Religious Services: Never    Database administrator or Organizations: No    Attends Banker Meetings: Never    Marital Status: Divorced    Hospital Course:   57 yo female presented with mania and aggression.  Medications started and adjusted along with therapy initiation. The client's mania resolved along with her depression, some anxiety today about going to the [facility] which is to be expected.  Emotional support provided along with assistance from the social worker. Denies suicidal/homicidal ideations, hallucinations, and substance abuse. She has met maximum benefit of hospitalization. Discharge instructions provided along with explanations with Rx, crisis numbers, and follow up appointment information.   Musculoskeletal: Strength & Muscle Tone: within normal limits Gait & Station: normal Patient leans: N/A  Psychiatric Specialty Exam: Physical Exam Vitals and nursing note reviewed.  Constitutional:      Appearance: Normal appearance.  HENT:     Head: Normocephalic.     Nose: Nose normal.  Pulmonary:     Effort: Pulmonary effort is normal.  Musculoskeletal:        General: Normal range of motion.     Cervical back: Normal range of motion.  Neurological:     General: No focal deficit present.     Mental Status: She is alert and oriented to person, place, and time.  Psychiatric:        Attention and Perception: Attention and perception normal.        Mood and Affect: Mood is anxious.        Speech: Speech normal.        Behavior: Behavior normal. Behavior is cooperative.        Thought Content: Thought content normal.        Cognition and Memory: Cognition and memory normal.        Judgment: Judgment normal.     Review of Systems  Psychiatric/Behavioral:  The patient is nervous/anxious.   All other systems reviewed and are negative.   Blood pressure (!) 115/54, pulse 62,  temperature (!) 97.3 F (36.3 C), resp. rate 20, height 5\' 1"  (1.549 m), weight 61.2 kg, SpO2 100%.Body mass index is 25.51 kg/m.  General Appearance: Casual  Eye Contact:  Good  Speech:  Normal Rate  Volume:  Normal  Mood:  Anxious, mild  Affect:  Congruent  Thought Process:  Coherent  Orientation:  Full (Time, Place, and Person)  Thought Content:  Logical  Suicidal Thoughts:  No  Homicidal Thoughts:  No  Memory:  Immediate;   Good Recent;   Good Remote;   Good  Judgement:  Fair  Insight:  Fair  Psychomotor Activity:  Normal  Concentration:  Concentration: Good and Attention  Span: Good  Recall:  Good  Fund of Knowledge:  Good  Language:  Good  Akathisia:  No  Handed:  Right  AIMS (if indicated):     Assets:  Leisure Time Physical Health Resilience  ADL's:  Intact  Cognition:  WNL  Sleep:        Physical Exam: Physical Exam Vitals and nursing note reviewed.  Constitutional:      Appearance: Normal appearance.  HENT:     Head: Normocephalic.     Nose: Nose normal.  Pulmonary:     Effort: Pulmonary effort is normal.  Musculoskeletal:        General: Normal range of motion.     Cervical back: Normal range of motion.  Neurological:     General: No focal deficit present.     Mental Status: She is alert and oriented to person, place, and time.    Review of Systems  Psychiatric/Behavioral:  The patient is nervous/anxious.   All other systems reviewed and are negative.  Blood pressure (!) 115/54, pulse 62, temperature (!) 97.3 F (36.3 C), resp. rate 20, height 5\' 1"  (1.549 m), weight 61.2 kg, SpO2 100%. Body mass index is 25.51 kg/m.   Social History   Tobacco Use  Smoking Status Every Day   Current packs/day: 0.50   Types: Cigarettes  Smokeless Tobacco Never   Tobacco Cessation:  A prescription for an FDA-approved tobacco cessation medication was offered at discharge and the patient refused   Blood Alcohol level:  Lab Results  Component Value Date    [facility] <10 09/27/2022   ETH <10 11/24/2017    Metabolic Disorder Labs:  Lab Results  Component Value Date   HGBA1C 5.4 10/01/2022   MPG 108 10/01/2022   MPG 102.54 11/30/2017   Lab Results  Component Value Date   PROLACTIN 12.4 11/30/2017   Lab Results  Component Value Date   CHOL 163 10/01/2022   TRIG 96 10/01/2022   HDL 51 10/01/2022   CHOLHDL 3.2 10/01/2022   VLDL 19 10/01/2022   LDLCALC 93 10/01/2022   LDLCALC 86 04/04/2022    See Psychiatric Specialty Exam and Suicide Risk Assessment completed by Attending Physician prior to discharge.  Discharge destination:  Home  Is patient on multiple antipsychotic therapies at discharge:  No   Has Patient had three or more failed trials of antipsychotic monotherapy by history:  No  Recommended Plan for Multiple Antipsychotic Therapies: NA  Discharge Instructions     Diet - low sodium heart healthy   Complete by: As directed    Discharge instructions   Complete by: As directed    Follow up with outpatient appointment at [facility] or [facility]   Increase activity slowly   Complete by: As directed       Allergies as of 10/11/2022       Reactions   Penicillins Hives   Has patient had a PCN reaction causing immediate rash, facial/tongue/throat swelling, SOB or lightheadedness with hypotension: Yes Has patient had a PCN reaction causing severe rash involving mucus membranes or skin necrosis: No Has patient had a PCN reaction that required hospitalization: No Has patient had a PCN reaction occurring within the last 10 years: Yes If all of the above answers are "NO", then may proceed with Cephalosporin use.   Demerol [meperidine]         Medication List     STOP taking these medications    albuterol 108 (90 Base) MCG/ACT inhaler Commonly known as: Proventil  HFA   cetirizine 10 MG tablet Commonly known as: ZyrTEC Allergy   Fanapt 6 MG Tabs Generic drug: Iloperidone   fluticasone-salmeterol 100-50 MCG/ACT  Aepb Commonly known as: ADVAIR   lamoTRIgine 200 MG tablet Commonly known as: LAMICTAL       TAKE these medications      Indication  divalproex 500 MG DR tablet Commonly known as: DEPAKOTE Take 1 tablet (500 mg total) by mouth every 12 (twelve) hours.  Indication: Manic Phase of Manic-Depression   hydrOXYzine 10 MG tablet Commonly known as: ATARAX Take 1 tablet (10 mg total) by mouth 3 (three) times daily as needed for anxiety.  Indication: Feeling Anxious   omeprazole 20 MG capsule Commonly known as: PRILOSEC Take 1 capsule (20 mg total) by mouth daily.  Indication: Gastroesophageal Reflux Disease   paliperidone 3 MG 24 hr tablet Commonly known as: INVEGA Take 1 tablet (3 mg total) by mouth daily. Start taking on: October 12, 2022  Indication: mood stabilization   pravastatin 40 MG tablet Commonly known as: PRAVACHOL Take 1 tablet (40 mg total) by mouth daily.  Indication: High Amount of Fats in the Blood        Follow-up Information     [facility] Follow up.   Why: Referral has been made. They will be contacting you at [phone] regarding appointment. Contact information: [address]  [address] [place] [address] [phone]         [facility] Follow up.   Specialty: Behavioral Health Why: This is a provider that offers walk-in services on Mondays, Wednesdays, Thursdays beginning at 8AM. It is recommended that you arrive prior to 7:30AM on the day that you walk-in, sign in, and they will begin seeking clients at 8AM. Thanks! Contact information: [address] [place] [address] [phone]                Follow-up recommendations:   Activity:  as tolerated  Diet:  heart healthy diet Bipolar affective disorder, depressed, severe Depakote 500mg  oral every 12 hours  Invega 3mg  oral daily Lipid panel WDL, TSH WDL, and A1C WDL & EKG no issues   Anxiety Hydroxyzine 10mg  oral TID  PRN  Comments:  follow up with outpatient appointment  Signed: Nanine Means, NP 10/11/2022, 11:36 AM

## 2022-10-11 NOTE — BHH Suicide Risk Assessment (Signed)
[facility] Discharge Suicide Risk Assessment   Principal Problem: Bipolar affective disorder, depressed, severe, with psychotic behavior (HCC) Discharge Diagnoses: Principal Problem:   Bipolar affective disorder, depressed, severe, with psychotic behavior (HCC)   Total Time spent with patient: 45 minutes  Musculoskeletal: Strength & Muscle Tone: within normal limits Gait & Station: normal Patient leans: N/A  Psychiatric Specialty Exam: Physical Exam Vitals and nursing note reviewed.  Constitutional:      Appearance: Normal appearance.  HENT:     Head: Normocephalic.     Nose: Nose normal.  Pulmonary:     Effort: Pulmonary effort is normal.  Musculoskeletal:        General: Normal range of motion.     Cervical back: Normal range of motion.  Neurological:     General: No focal deficit present.     Mental Status: She is alert and oriented to person, place, and time.  Psychiatric:        Attention and Perception: Attention and perception normal.        Mood and Affect: Mood is anxious.        Speech: Speech normal.        Behavior: Behavior normal. Behavior is cooperative.        Thought Content: Thought content normal.        Cognition and Memory: Cognition and memory normal.        Judgment: Judgment normal.     Review of Systems  Psychiatric/Behavioral:  The patient is nervous/anxious.   All other systems reviewed and are negative.   Blood pressure (!) 115/54, pulse 62, temperature (!) 97.3 F (36.3 C), resp. rate 20, height 5\' 1"  (1.549 m), weight 61.2 kg, SpO2 100%.Body mass index is 25.51 kg/m.  General Appearance: Casual  Eye Contact:  Good  Speech:  Normal Rate  Volume:  Normal  Mood:  Anxious, mild  Affect:  Congruent  Thought Process:  Coherent  Orientation:  Full (Time, Place, and Person)  Thought Content:  Logical  Suicidal Thoughts:  No  Homicidal Thoughts:  No  Memory:  Immediate;   Good Recent;   Good Remote;   Good  Judgement:  Fair  Insight:  Fair   Psychomotor Activity:  Normal  Concentration:  Concentration: Good and Attention Span: Good  Recall:  Good  Fund of Knowledge:  Good  Language:  Good  Akathisia:  No  Handed:  Right  AIMS (if indicated):     Assets:  Leisure Time Physical Health Resilience  ADL's:  Intact  Cognition:  WNL  Sleep:         Physical Exam: Physical Exam Vitals and nursing note reviewed.  Constitutional:      Appearance: Normal appearance.  HENT:     Head: Normocephalic.     Nose: Nose normal.  Pulmonary:     Effort: Pulmonary effort is normal.  Musculoskeletal:        General: Normal range of motion.     Cervical back: Normal range of motion.  Neurological:     General: No focal deficit present.     Mental Status: She is alert and oriented to person, place, and time.  Psychiatric:        Attention and Perception: Attention and perception normal.        Mood and Affect: Mood is anxious.        Speech: Speech normal.        Behavior: Behavior normal. Behavior is cooperative.  Thought Content: Thought content normal.        Cognition and Memory: Cognition and memory normal.        Judgment: Judgment normal.    Review of Systems  Psychiatric/Behavioral:  The patient is nervous/anxious.   All other systems reviewed and are negative.  Blood pressure (!) 115/54, pulse 62, temperature (!) 97.3 F (36.3 C), resp. rate 20, height 5\' 1"  (1.549 m), weight 61.2 kg, SpO2 100%. Body mass index is 25.51 kg/m.  Mental Status Per Nursing Assessment::   On Admission:  NA  Demographic Factors:  Caucasian and Unemployed  Loss Factors: Financial problems/change in socioeconomic status  Historical Factors: NA  Risk Reduction Factors:   Sense of responsibility to family, Positive therapeutic relationship, and Positive coping skills or problem solving skills  Continued Clinical Symptoms:  Anxiety, mild  Cognitive Features That Contribute To Risk:  None    Suicide Risk:  Minimal: No  identifiable suicidal ideation.  Patients presenting with no risk factors but with morbid ruminations; may be classified as minimal risk based on the severity of the depressive symptoms   Follow-up Information     [facility] Follow up.   Why: Referral has been made. They will be contacting you at [phone] regarding appointment. Contact information: [address]  [address] [place] [address] [phone]         [facility] Follow up.   Specialty: Behavioral Health Why: This is a provider that offers walk-in services on Mondays, Wednesdays, Thursdays beginning at 8AM. It is recommended that you arrive prior to 7:30AM on the day that you walk-in, sign in, and they will begin seeking clients at 8AM. Thanks! Contact information: [address] [place] [address] [phone]                Plan Of Care/Follow-up recommendations:  Activity:  as tolerated  Diet:  heart healthy diet Bipolar affective disorder, depressed, severe Depakote 500mg  oral every 12 hours  Invega 3mg  oral daily Lipid panel WDL, TSH WDL, and A1C WDL & EKG no issues   Anxiety Hydroxyzine 10mg  oral TID PRN  Nanine Means, NP 10/11/2022, 11:26 AM

## 2022-10-11 NOTE — BHH Counselor (Signed)
CSW met with pt to discuss discharge. She shared that she will be going to the [facility] in [place] upon discharge. Pt endorsed some anxiety around this as she had never been homeless before. CSW reminded pt that this was just a temporary step in her process to get better and that anxiety is a part of any change that we experience in life. She voiced understanding. Pt voiced desire to find employment and housing after leaving the hospital. CSW explained that the [facility] can help with things like this as well. CSW offered to print information on the [facility] for pt. She agreed. Pt was also informed that she would be given a Pharmacist, hospital for [facility] which includes things like housing and employment resources. Pt voiced understanding. She acknowledged that she was uncertain regarding transportation. Pt shared that she spoke with her sister-in-law who informed her that they would be bringing her things to her. She has clothes to wear home upon discharge. Pt is a smoker who declined interest in cessation but was open to it in the future. CSW informed her that he would add a brouchure for them to her discharge paperwork if she did change her mind. Pt denied any substance use. She is open to referral for outpatient medication management and therapy to be arranged by CSW. CSW and pt spoke about [facility] but also [facility]. Pt in agreement with referral. CSW inquired regarding pt's contact information. She shared that she had just gotten a new number prior to admission and was not sure about what the number was. Pt went on to share that CSW could contact her brother to ask for number. CSW agreed. No other concerns expressed. Contact ended without incident.   Resource guide for [employer] attached to discharge paperwork, along with [employer], and housing Pharmacist, hospital.   Vilma Meckel. Algis Greenhouse, MSW, LCSW, LCAS 10/11/2022 2:47 PM

## 2022-10-11 NOTE — Discharge Instructions (Addendum)

## 2022-10-11 NOTE — Progress Notes (Signed)
  [facility] Adult Case Management Discharge Plan :  Will you be returning to the same living situation after discharge:  No. At discharge, do you have transportation home?: Yes,  CSW to assist with transportation arrangements.  Do you have the ability to pay for your medications: No.  Release of information consent forms completed and in the chart;  Patient's signature needed at discharge.  Patient to Follow up at:  Follow-up Information     [facility] Follow up.   Why: Your appointment is scheduled for 10/16/22 at 12:30PM. They will be contacting you at [phone]. Thanks! Contact information: [address]  [address] [place] [address] [phone]         [facility] Follow up.   Specialty: Behavioral Health Why: This is a provider that offers walk-in services on Mondays, Wednesdays, Thursdays beginning at 8AM. It is recommended that you arrive prior to 7:30AM on the day that you walk-in, sign in, and they will begin seeking clients at 8AM. Thanks! Contact information: [address] [place] [address] [phone]                Next level of care provider has access to [facility] Link:no  Safety Planning and Suicide Prevention discussed: Yes,  SPE completed with pt as pt's brother declined involvement with pt post discharge.     Has patient been referred to the Quitline?: Patient refused referral for treatment  Patient has been referred for addiction treatment: Patient refused referral for treatment.  Glenis Smoker, LCSW 10/11/2022, 2:47 PM

## 2022-10-11 NOTE — Group Note (Signed)
Date:  10/11/2022 Time:  12:40 PM  Group Topic/Focus:  Goals Group:   The focus of this group is to help patients establish daily goals to achieve during treatment and discuss how the patient can incorporate goal setting into their daily lives to aide in recovery.    Participation Level:  Active  Participation Quality:  Appropriate  Affect:  Appropriate  Cognitive:  Appropriate  Insight: Appropriate  Engagement in Group:  Engaged  Modes of Intervention:  Discussion, Education, and Support  Additional Comments:    Chloe [name] 10/11/2022, 12:40 PM

## 2022-10-12 NOTE — BH Assessment (Signed)
Pt was inpatient at [facility] and discharged this afternoon. She was transported to the homeless shelter in [place]. Pt says the facility was closed from 3:00 pm-6:30 pm for cleaning. She says she did not feel it was an appropriate place for someone with mental health problems. Pt denies current suicidal ideation, homicidal ideation, psychotic symptoms. She denies alcohol or substance use. She says she came to [facility] because she is hungry and needs a safe place to rest. Pt is Routine.

## 2022-10-14 ENCOUNTER — Other Ambulatory Visit (HOSPITAL_COMMUNITY): Payer: Self-pay

## 2022-10-14 ENCOUNTER — Ambulatory Visit (HOSPITAL_COMMUNITY): Payer: No Payment, Other | Admitting: Psychiatry

## 2022-10-14 ENCOUNTER — Other Ambulatory Visit: Payer: Self-pay

## 2022-10-15 ENCOUNTER — Other Ambulatory Visit: Payer: Self-pay

## 2022-10-16 ENCOUNTER — Other Ambulatory Visit: Payer: Self-pay

## 2022-10-26 ENCOUNTER — Ambulatory Visit (HOSPITAL_COMMUNITY)
Admission: EM | Admit: 2022-10-26 | Discharge: 2022-10-26 | Disposition: A | Discharge status: Home / Self Care | Payer: Medicaid Other | Attending: Emergency Medicine | Admitting: Emergency Medicine

## 2022-10-26 ENCOUNTER — Encounter (HOSPITAL_COMMUNITY): Payer: Self-pay

## 2022-10-26 DIAGNOSIS — J441 Chronic obstructive pulmonary disease with (acute) exacerbation: Secondary | ICD-10-CM

## 2022-10-26 DIAGNOSIS — R197 Diarrhea, unspecified: Secondary | ICD-10-CM | POA: Diagnosis not present

## 2022-10-26 MED ORDER — ALBUTEROL SULFATE HFA 108 (90 BASE) MCG/ACT IN AERS: 2.0000 | INHALATION_SPRAY | Freq: Four times a day (QID) | RESPIRATORY_TRACT | 1 refills | Status: DC | PRN

## 2022-10-26 MED ORDER — LEVOFLOXACIN 500 MG PO TABS
500.0000 mg | ORAL_TABLET | Freq: Every day | ORAL | 0 refills | Status: DC
Start: 2022-10-26 — End: 2022-10-26

## 2022-10-26 MED ORDER — PROMETHAZINE-DM 6.25-15 MG/5ML PO SYRP: 5.0000 mL | ORAL_SOLUTION | Freq: Four times a day (QID) | ORAL | 0 refills | Status: DC | PRN

## 2022-10-26 MED ORDER — LEVOFLOXACIN 500 MG PO TABS: 500.0000 mg | ORAL_TABLET | Freq: Every day | ORAL | 0 refills | Status: AC

## 2022-10-26 NOTE — ED Provider Notes (Signed)
[facility]    CSN: [id number] Arrival date & time: 10/26/22  1004     History   Chief Complaint Chief Complaint  Patient presents with   Cough   Diarrhea    HPI Chloe [name] is a 57 y.o. female.  Poor historian. Patient originally reporting cough for 1 year. Discussed urgent care does not treat chronic cough. Seems maybe worsening over the last several weeks? Cough is productive of green mucous Not having fever or shortness of breath. No chest pain or leg swelling. She has history of COPD. Uses steroid inhaler BID and albuterol but albuterol expired She did not have medicaid until recently and could not be seen for her symptoms until now   Also 3 days of diarrhea. Soft stools frequently. No nausea or vomiting. Tolerating food and fluids. No urinary symptoms.  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   Tobacco dependency 07/19/2020   Bipolar affective disorder, depressed, severe, with psychotic behavior (HCC) 11/24/2017    Past Surgical History:  Procedure Laterality Date   ABDOMINAL HYSTERECTOMY     had nausea and vomiting post-op   BREAST LUMPECTOMY WITH RADIOACTIVE SEED LOCALIZATION Right 11/12/2016   Procedure: RIGHT BREAST LUMPECTOMY WITH RADIOACTIVE SEED LOCALIZATION;  Surgeon: Harriette Bouillon, MD;  Location: [facility];  Service: General;  Laterality: Right;   CHOLECYSTECTOMY     LAPAROSCOPIC LYSIS OF ADHESIONS     MYOMECTOMY     PLANTAR FASCIA SURGERY Left     OB History     Gravida  1   Para      Term      Preterm       AB  1   Living         SAB      IAB      Ectopic      Multiple      Live Births  0        Obstetric Comments  57 y/o EAB          Home Medications    Prior to Admission medications   Medication Sig Start Date End Date Taking? Authorizing Provider  albuterol (VENTOLIN HFA) 108 (90 Base) MCG/ACT inhaler Inhale 2 puffs into the lungs every 6 (six) hours as needed for wheezing or shortness of breath. 10/26/22  Yes Eldrick Penick, Lurena Joiner, PA-C  divalproex (DEPAKOTE) 500 MG DR tablet Take 1 tablet (500 mg total) by mouth every 12 (twelve) hours. 10/11/22  Yes Charm Rings, NP  hydrOXYzine (ATARAX) 10 MG tablet Take 1 tablet (10 mg total) by mouth 3 (three) times daily as needed for anxiety. 10/11/22  Yes Charm Rings, NP  levofloxacin (LEVAQUIN) 500 MG tablet Take 1 tablet (500 mg total) by mouth daily for 5 days. 10/26/22 10/31/22 Yes Kirk Sampley, Lurena Joiner, PA-C  omeprazole (PRILOSEC) 20 MG capsule Take 1 capsule (20 mg total) by mouth daily. 09/03/22  Yes Storm Frisk, MD  paliperidone (INVEGA) 3 MG 24 hr tablet Take 1 tablet (3  mg total) by mouth daily. 10/12/22  Yes Charm Rings, NP  pravastatin (PRAVACHOL) 40 MG tablet Take 1 tablet (40 mg total) by mouth daily. 09/03/22  Yes Storm Frisk, MD  promethazine-dextromethorphan (PROMETHAZINE-DM) 6.25-15 MG/5ML syrup Take 5 mLs by mouth 4 (four) times daily as needed for cough. 10/26/22  Yes Taylin Mans, Lurena Joiner, PA-C    Family History Family History  Problem Relation Age of Onset   Lung cancer Mother    Breast cancer Maternal Aunt    Hyperlipidemia Brother    Colon cancer Neg Hx    Esophageal cancer Neg Hx    Stomach cancer Neg Hx     Social History Social History   Tobacco Use   Smoking status: Every Day    Current packs/day: 0.50    Types: Cigarettes   Smokeless tobacco: Never  Vaping Use   Vaping status: Never Used  Substance Use Topics   Alcohol use: Not Currently    Comment: social   Drug use: No      Allergies   Penicillins and Demerol [meperidine]   Review of Systems Review of Systems  Respiratory:  Positive for cough.   Gastrointestinal:  Positive for diarrhea.   Per HPI  Physical Exam Triage Vital Signs ED Triage Vitals  Encounter Vitals Group     BP 10/26/22 1019 (!) 151/88     Systolic BP Percentile --      Diastolic BP Percentile --      Pulse Rate 10/26/22 1021 78     Resp 10/26/22 1019 16     Temp 10/26/22 1021 98 F (36.7 C)     Temp Source 10/26/22 1021 Oral     SpO2 10/26/22 1019 97 %     Weight --      Height --      Head Circumference --      Peak Flow --      Pain Score --      Pain Loc --      Pain Education --      Exclude from Growth Chart --    No data found.  Updated Vital Signs BP (!) 151/88 (BP Location: Left Arm)   Pulse 78   Temp 98 F (36.7 C) (Oral)   Resp 16   SpO2 97%   Physical Exam Vitals and nursing note reviewed.  Constitutional:      Appearance: She is not ill-appearing.  HENT:     Nose: No congestion or rhinorrhea.     Mouth/Throat:     Mouth: Mucous membranes are moist.     Pharynx: Oropharynx is clear. No posterior oropharyngeal erythema.  Eyes:     Conjunctiva/sclera: Conjunctivae normal.  Cardiovascular:     Rate and Rhythm: Normal rate and regular rhythm.     Heart sounds: Normal heart sounds.  Pulmonary:     Effort: Pulmonary effort is normal.     Breath sounds: Normal breath sounds.  Abdominal:     Palpations: Abdomen is soft.     Tenderness: There is no abdominal tenderness. There is no guarding or rebound.  Musculoskeletal:     Cervical back: Normal range of motion.  Lymphadenopathy:     Cervical: No cervical adenopathy.  Skin:    General: Skin is warm and dry.  Neurological:     Mental Status: She is alert and oriented to person, place, and time.     UC Treatments / Results  Labs (all labs ordered are listed, but only  abnormal results are displayed) Labs Reviewed - No data to  display  EKG  Radiology No results found.  Procedures Procedures (including critical care time)  Medications Ordered in UC Medications - No data to display  Initial Impression / Assessment and Plan / UC Course  I have reviewed the triage vital signs and the nursing notes.  Pertinent labs & imaging results that were available during my care of the patient were reviewed by me and considered in my medical decision making (see chart for details).  Afebrile and well appearing. Lungs are clear. Sating 97% on room air. Patient with likely COPD exacerbation given her increased sputum production and severity of cough. She is requesting antibiotic, does well with levaquin She wanted the antibiotic to treat her diarrhea as well. We discussed that the diarrhea is likely viral and if we put her on an antibiotic, her diarrhea will more than likely worsen.  Patient education about viral versus bacterial infections and likely cause of her diarrhea.  Patient verbalizes understanding, she would like to take the antibiotic.  Discussed needs to increase fluids as much as tolerated. Levaquin 500 mg daily for 5 days Sent Promethazine DM cough syrup, advised drowsy precautions.  Refilled albuterol inhaler, recommend to use 2 or 3 times daily for the next week or so in addition to her steroid inhaler twice daily Return and ED precautions Patient agreeable to plan  Final Clinical Impressions(s) / UC Diagnoses   Final diagnoses:  COPD exacerbation (HCC)  Diarrhea, unspecified type     Discharge Instructions      Albuterol inhaler 3x daily for the next week or so Continue the fluticasone inhaler twice daily Promethazine-DM cough syrup can be used 4x daily. If this makes you drowsy, please be cautions and take only at night.  The antibiotic is to treat a possible exacerbation of your COPD. Please note this medication will make your diarrhea worse. Increase your water intake to double what you are  drinking now.     ED Prescriptions     Medication Sig Dispense Auth. Provider   albuterol (VENTOLIN HFA) 108 (90 Base) MCG/ACT inhaler Inhale 2 puffs into the lungs every 6 (six) hours as needed for wheezing or shortness of breath. 8 g Derenda Giddings, PA-C   promethazine-dextromethorphan (PROMETHAZINE-DM) 6.25-15 MG/5ML syrup Take 5 mLs by mouth 4 (four) times daily as needed for cough. 240 mL Kaiyden Simkin, PA-C   levofloxacin (LEVAQUIN) 500 MG tablet Take 1 tablet (500 mg total) by mouth daily for 5 days. 5 tablet Zac Torti, Lurena Joiner, PA-C      PDMP not reviewed this encounter.   Shalee Paolo, Lurena Joiner, [place] 10/26/22 1139

## 2022-10-26 NOTE — ED Triage Notes (Signed)
Also, patient is using a steroid inhaler.

## 2022-10-26 NOTE — ED Triage Notes (Signed)
Pt reports she is staying in a shelter and sleeping on the floor.  She c/o lower back pain when she cough. She is coughing up green mucous; taking nyquil.  Pt reports diarrhea stools x 3 days.

## 2022-10-26 NOTE — Discharge Instructions (Addendum)
Albuterol inhaler 3x daily for the next week or so Continue the fluticasone inhaler twice daily Promethazine-DM cough syrup can be used 4x daily. If this makes you drowsy, please be cautions and take only at night.  The antibiotic is to treat a possible exacerbation of your COPD. Please note this medication will make your diarrhea worse. Increase your water intake to double what you are drinking now.

## 2022-11-04 ENCOUNTER — Other Ambulatory Visit: Payer: Self-pay

## 2022-11-04 ENCOUNTER — Encounter (HOSPITAL_COMMUNITY): Payer: Self-pay | Admitting: *Deleted

## 2022-11-04 ENCOUNTER — Ambulatory Visit (HOSPITAL_COMMUNITY)
Admission: EM | Admit: 2022-11-04 | Discharge: 2022-11-04 | Disposition: A | Discharge status: Home / Self Care | Payer: Medicaid Other | Attending: Physician Assistant | Admitting: Physician Assistant

## 2022-11-04 DIAGNOSIS — L03311 Cellulitis of abdominal wall: Secondary | ICD-10-CM | POA: Diagnosis not present

## 2022-11-04 MED ORDER — DOXYCYCLINE HYCLATE 100 MG PO TABS
100.0000 mg | ORAL_TABLET | Freq: Two times a day (BID) | ORAL | 0 refills | Status: DC
  Filled 2022-11-04 – 2022-11-05 (×2): qty 20, 10d supply, fill #0

## 2022-11-04 NOTE — ED Triage Notes (Signed)
Pt returns today because the red kin area on ABD is worse. Pt was seen last week for a cough. Pt has finished the anti-bx  and the  skin area is still red.

## 2022-11-04 NOTE — ED Provider Notes (Signed)
[facility]    CSN: [id number] Arrival date & time: 11/04/22  1439      History   Chief Complaint Chief Complaint  Patient presents with   infection skin    HPI Chloe [name] is a 57 y.o. female.   Patient presents today with a several day history of enlarging erythematous lesion on her anterior abdomen.  She is unsure exactly how long it has been there but it has been spreading over the past few days.  She denies any known insect exposure or bite but does report that she is currently living in a homeless shelter and sleeping on the floor.  She denies any associated itching but does report associated discomfort/pain which is currently rated 4 on a 0-10 pain scale, described as aching, no aggravating alleviating factors identified.  She has not been taking any medication to manage her symptoms.  Denies history of recurrent skin infections or MRSA.  She was recently treated for COPD exacerbation with Levaquin 10/26/2022 with her last dose yesterday.  She denies additional antibiotics in the past 90 days.  Denies history of diabetes.  Denies any systemic symptoms including fever, nausea, vomiting.    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   Tobacco dependency 07/19/2020   Bipolar affective disorder, depressed, severe, with psychotic behavior (HCC) 11/24/2017    Past Surgical History:  Procedure Laterality Date   ABDOMINAL HYSTERECTOMY     had nausea and vomiting post-op   BREAST LUMPECTOMY WITH RADIOACTIVE SEED LOCALIZATION Right 11/12/2016   Procedure: RIGHT BREAST LUMPECTOMY WITH RADIOACTIVE SEED LOCALIZATION;  Surgeon: Harriette Bouillon, MD;  Location: [facility];  Service: General;   Laterality: Right;   CHOLECYSTECTOMY     LAPAROSCOPIC LYSIS OF ADHESIONS     MYOMECTOMY     PLANTAR FASCIA SURGERY Left     OB History     Gravida  1   Para      Term      Preterm      AB  1   Living         SAB      IAB      Ectopic      Multiple      Live Births  0        Obstetric Comments  57 y/o EAB          Home Medications    Prior to Admission medications   Medication Sig Start Date End Date Taking? Authorizing Provider  albuterol (VENTOLIN HFA) 108 (90 Base) MCG/ACT inhaler Inhale 2 puffs into the lungs every 6 (six) hours as needed for wheezing or shortness of breath. 10/26/22  Yes Rising, Lurena Joiner, PA-C  divalproex (DEPAKOTE) 500 MG DR tablet Take 1 tablet (500 mg total) by mouth every 12 (twelve) hours. 10/11/22  Yes Charm Rings, NP  doxycycline (VIBRAMYCIN) 100 MG capsule Take 1 capsule (100 mg total) by mouth 2 (two) times daily. 11/04/22  Yes Arvilla Salada K, PA-C  hydrOXYzine (ATARAX) 10 MG tablet Take 1 tablet (10 mg total) by mouth 3 (three) times daily as needed for anxiety. 10/11/22  Yes Charm Rings, NP  omeprazole (PRILOSEC) 20 MG capsule Take 1 capsule (20 mg total) by mouth daily. 09/03/22  Yes Storm Frisk, MD  paliperidone (INVEGA) 3 MG 24 hr tablet Take 1 tablet (3 mg total) by mouth daily. 10/12/22  Yes Charm Rings, NP  pravastatin (PRAVACHOL) 40 MG tablet Take 1 tablet (40 mg total) by mouth daily. 09/03/22  Yes Storm Frisk, MD  promethazine-dextromethorphan (PROMETHAZINE-DM) 6.25-15 MG/5ML syrup Take 5 mLs by mouth 4 (four) times daily as needed for cough. 10/26/22  Yes Rising, Lurena Joiner, PA-C    Family History Family History  Problem Relation Age of Onset   Lung cancer Mother    Breast cancer Maternal Aunt    Hyperlipidemia Brother    Colon cancer Neg Hx    Esophageal cancer Neg Hx    Stomach cancer Neg Hx     Social History Social History   Tobacco Use   Smoking status: Every Day    Current packs/day:  0.50    Types: Cigarettes   Smokeless tobacco: Never  Vaping Use   Vaping status: Never Used  Substance Use Topics   Alcohol use: Not Currently    Comment: social   Drug use: No     Allergies   Penicillins and Demerol [meperidine]   Review of Systems Review of Systems  Constitutional:  Negative for activity change, appetite change, fatigue and fever.  Gastrointestinal:  Negative for abdominal pain, diarrhea, nausea and vomiting.  Musculoskeletal:  Negative for arthralgias and myalgias.  Skin:  Positive for color change. Negative for rash and wound.     Physical Exam Triage Vital Signs ED Triage Vitals [11/04/22 1615]  Encounter Vitals Group     BP 110/62     Systolic BP Percentile      Diastolic BP Percentile      Pulse Rate 87     Resp 18     Temp 98 F (36.7 C)     Temp src      SpO2 95 %     Weight      Height      Head Circumference      Peak Flow      Pain Score      Pain Loc      Pain Education      Exclude from Growth Chart    No data found.  Updated Vital Signs BP 110/62   Pulse 87   Temp 98 F (36.7 C)   Resp 18   SpO2 95%   Visual Acuity Right Eye Distance:   Left Eye Distance:   Bilateral Distance:    Right Eye Near:   Left Eye Near:    Bilateral Near:     Physical Exam Vitals reviewed.  Constitutional:      General: She is awake. She is not in acute distress.    Appearance: Normal appearance. She is well-developed. She is not ill-appearing.     Comments: Very pleasant female appears stated age in no acute distress sitting comfortably in exam room  HENT:     Head: Normocephalic and atraumatic.  Cardiovascular:     Rate and Rhythm: Normal rate and regular rhythm.     Heart sounds: Normal heart sounds, S1 normal and S2 normal. No murmur heard. Pulmonary:     Effort: Pulmonary effort is normal.     Breath sounds: Normal breath sounds. No wheezing, rhonchi or rales.     Comments: Clear to auscultation bilaterally Abdominal:  General: Bowel sounds are normal.     Palpations: Abdomen is soft.     Tenderness: There is no abdominal tenderness. There is no right CVA tenderness, left CVA tenderness, guarding or rebound.  Skin:    Findings: Erythema present.          Comments: 5 cm x 4 cm erythematous lesion with central induration and no fluctuance noted anterior abdomen inferior to umbilicus.  No streaking or evidence of lymphangitis.  No bleeding or drainage noted.  Psychiatric:        Behavior: Behavior is cooperative.      UC Treatments / Results  Labs (all labs ordered are listed, but only abnormal results are displayed) Labs Reviewed - No data to display  EKG   Radiology No results found.  Procedures Procedures (including critical care time)  Medications Ordered in UC Medications - No data to display  Initial Impression / Assessment and Plan / UC Course  I have reviewed the triage vital signs and the nursing notes.  Pertinent labs & imaging results that were available during my care of the patient were reviewed by me and considered in my medical decision making (see chart for details).     Patient is well-appearing, afebrile, nontoxic, nontachycardic.  Concern for cellulitis given clinical presentation.  No drainable abscess noted on exam.  She was started on doxycycline given her history of penicillin allergy and concern for MRSA due to risk factors.  Discussed that she is to avoid prolonged sun exposure while on this medication due to associated photosensitivity.  Area of erythema was demarcated we discussed that if this spread significantly she should be seen immediately.  If she has any worsening or changing symptoms including spread of erythema despite antibiotic treatment, fever, nausea, vomiting she needs to be seen immediately.  Strict return precautions given.  Final Clinical Impressions(s) / UC Diagnoses   Final diagnoses:  Cellulitis of abdominal wall     Discharge Instructions       We are treating you for a skin infection.  Start doxycycline 100 mg twice daily for 10 days.  If this area of redness spreads significantly despite the antibiotics need to be seen immediately.  If anything changes and you have rapid spread of redness, increasing pain, fever, nausea, vomiting you need to be seen immediately.  Follow-up with your primary care if symptoms have not resolved within a week.     ED Prescriptions     Medication Sig Dispense Auth. Provider   doxycycline (VIBRAMYCIN) 100 MG capsule Take 1 capsule (100 mg total) by mouth 2 (two) times daily. 20 capsule Fryda Molenda, Noberto Retort, PA-C      PDMP not reviewed this encounter.   Jeani Hawking, PA-C 11/04/22 1704

## 2022-11-04 NOTE — Discharge Instructions (Signed)
We are treating you for a skin infection.  Start doxycycline 100 mg twice daily for 10 days.  If this area of redness spreads significantly despite the antibiotics need to be seen immediately.  If anything changes and you have rapid spread of redness, increasing pain, fever, nausea, vomiting you need to be seen immediately.  Follow-up with your primary care if symptoms have not resolved within a week.

## 2022-11-05 ENCOUNTER — Other Ambulatory Visit: Payer: Self-pay

## 2022-11-05 ENCOUNTER — Other Ambulatory Visit (HOSPITAL_COMMUNITY): Payer: Self-pay | Admitting: Psychiatry

## 2022-11-11 ENCOUNTER — Other Ambulatory Visit: Payer: Self-pay

## 2022-11-12 ENCOUNTER — Other Ambulatory Visit: Payer: Self-pay

## 2022-11-14 ENCOUNTER — Other Ambulatory Visit (HOSPITAL_COMMUNITY): Payer: Self-pay

## 2022-11-14 ENCOUNTER — Encounter (HOSPITAL_COMMUNITY): Payer: Self-pay | Admitting: Psychiatry

## 2022-11-14 ENCOUNTER — Other Ambulatory Visit: Payer: Self-pay

## 2022-11-14 ENCOUNTER — Ambulatory Visit (HOSPITAL_COMMUNITY): Payer: Medicaid Other | Admitting: Psychiatry

## 2022-11-14 VITALS — BP 120/65 | HR 86 | Temp 98.0°F | Ht 61.0 in | Wt 151.4 lb

## 2022-11-14 DIAGNOSIS — F315 Bipolar disorder, current episode depressed, severe, with psychotic features: Secondary | ICD-10-CM

## 2022-11-14 DIAGNOSIS — F17200 Nicotine dependence, unspecified, uncomplicated: Secondary | ICD-10-CM

## 2022-11-14 DIAGNOSIS — F411 Generalized anxiety disorder: Secondary | ICD-10-CM | POA: Diagnosis not present

## 2022-11-14 IMAGING — CT CT ABD-PELV W/ CM
2 of 5 series · 16 of 46 positions shown, 18 images · IV contrast (Omni 300)
Comparison: None.

CLINICAL DATA: Right lower quadrant abdominal pain

EXAM:
CT ABDOMEN AND PELVIS WITH CONTRAST
TECHNIQUE: Multidetector CT imaging of the abdomen and pelvis was performed
using the standard protocol following bolus administration of
intravenous contrast.
CONTRAST:  100mL OMNIPAQUE IOHEXOL 300 MG/ML  SOLN

[Series 3: a/p w/ 5mm · axial · 0.88mm/px · z∈[+887,+1287]mm · 13 of 91 slices shown, 15 images]
[im 6/91  soft-tissue]
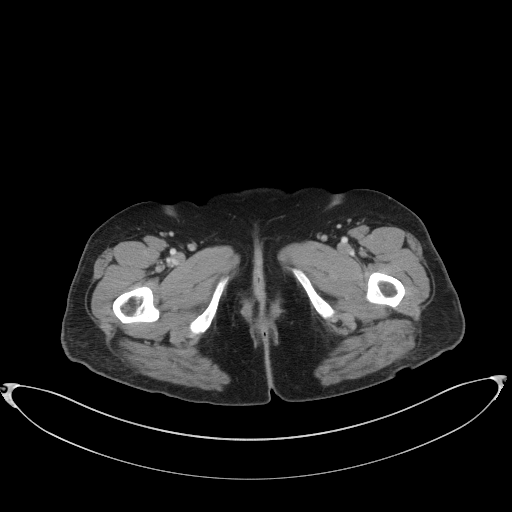
[im 6/91  bone]
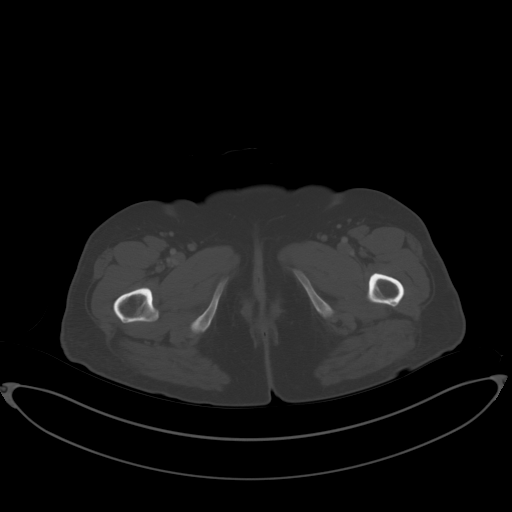
[im 11/91  soft-tissue]
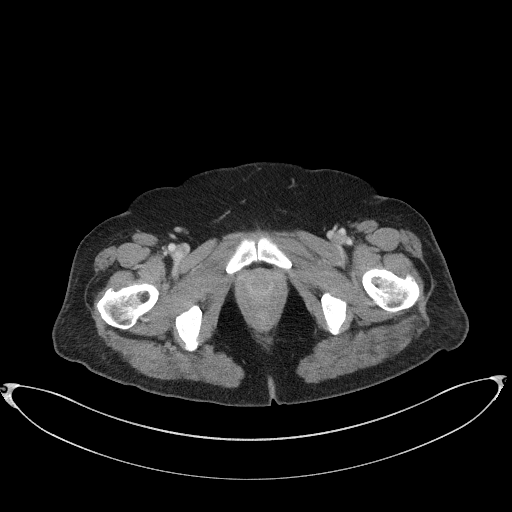
[im 21/91  soft-tissue]
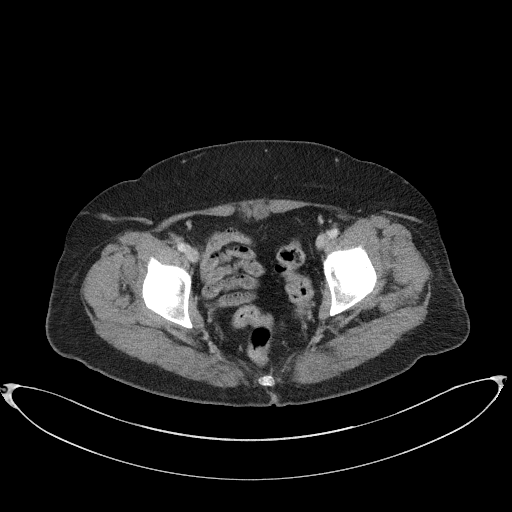
[im 26/91  soft-tissue]
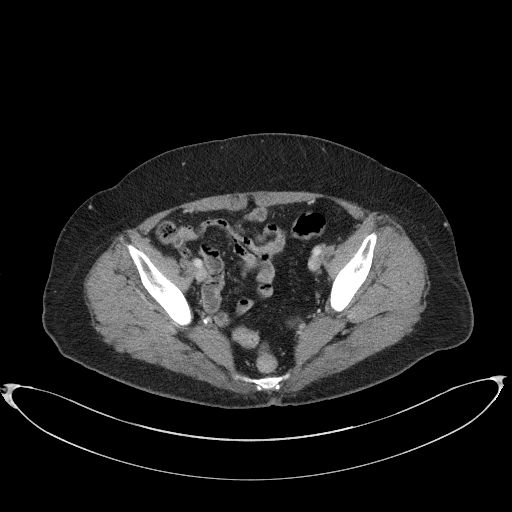
[im 31/91  soft-tissue]
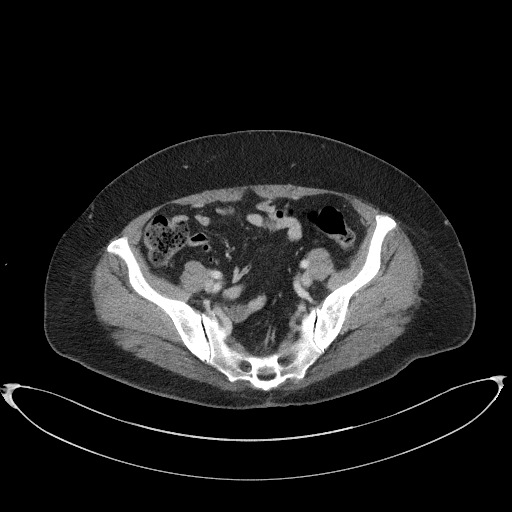
[im 41/91  soft-tissue]
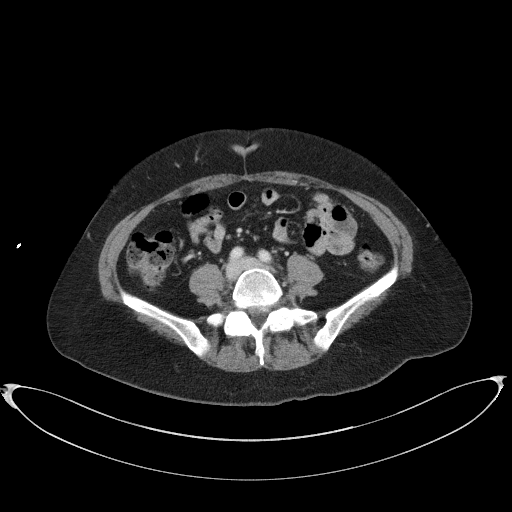
[im 46/91  soft-tissue]
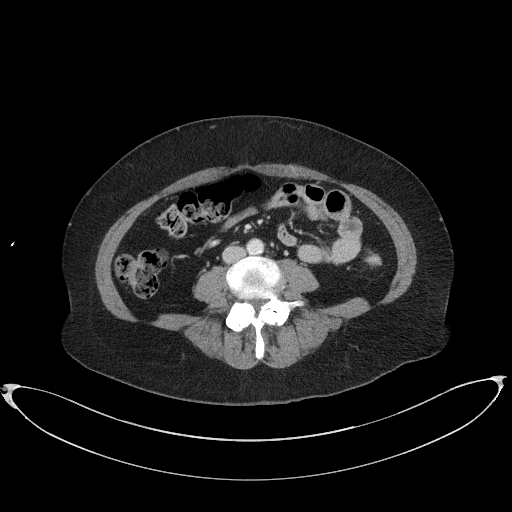
[im 51/91  soft-tissue]
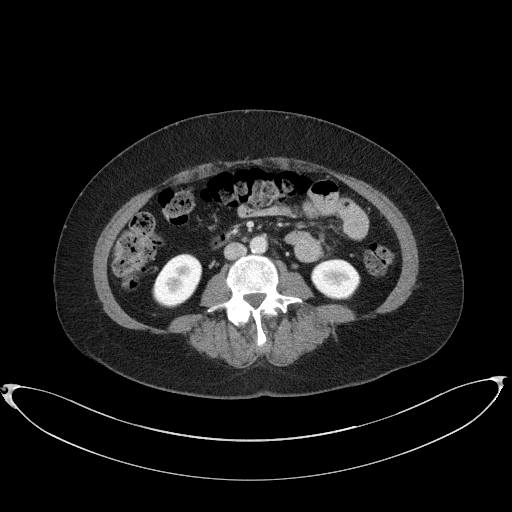
[im 61/91  soft-tissue]
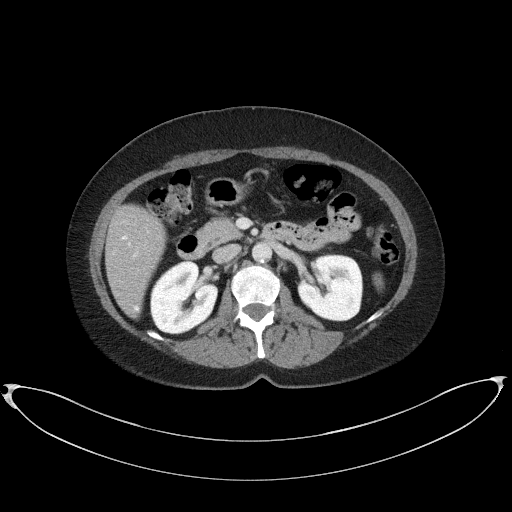
[im 61/91  bone]
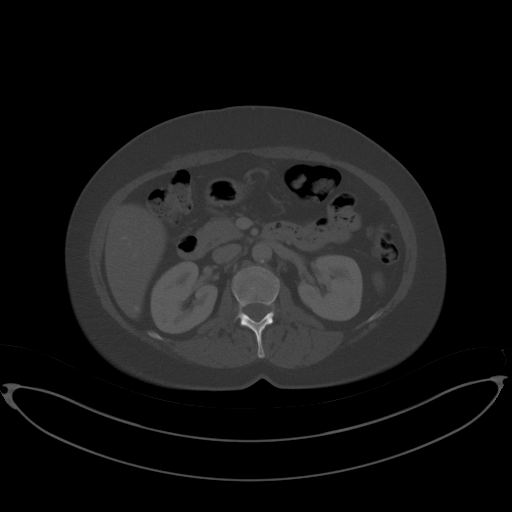
[im 66/91  soft-tissue]
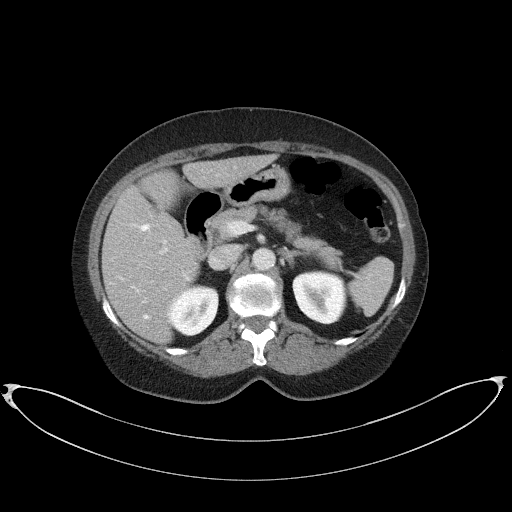
[im 71/91  soft-tissue]
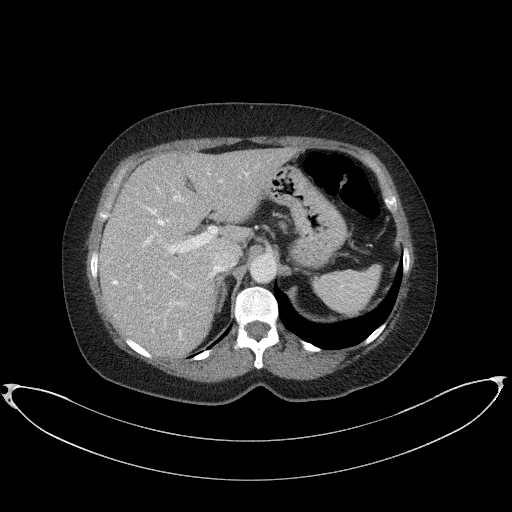
[im 81/91  soft-tissue]
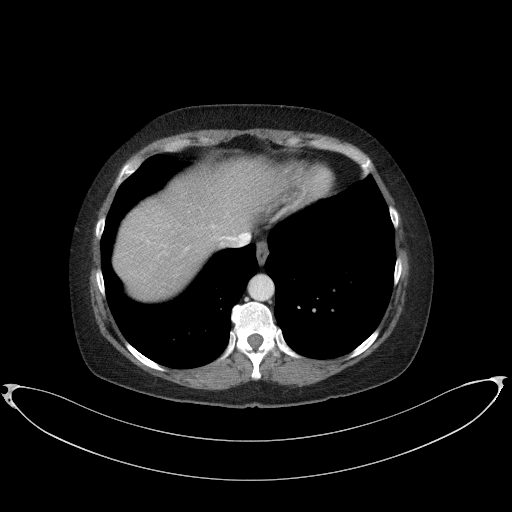
[im 86/91  soft-tissue]
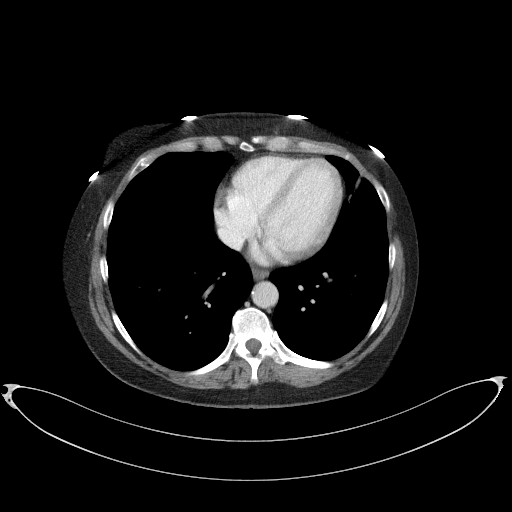

[Series 6: a/p w/ cor · coronal · 0.76mm/px · 3 of 147 slices shown]
[im 49/147  soft-tissue]
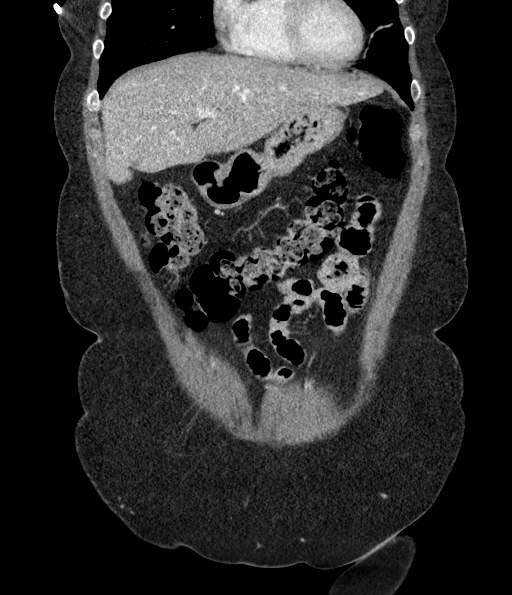
[im 65/147  soft-tissue]
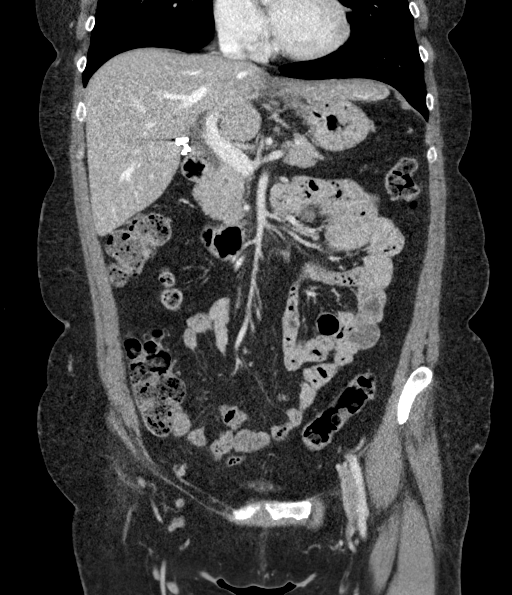
[im 82/147  soft-tissue]
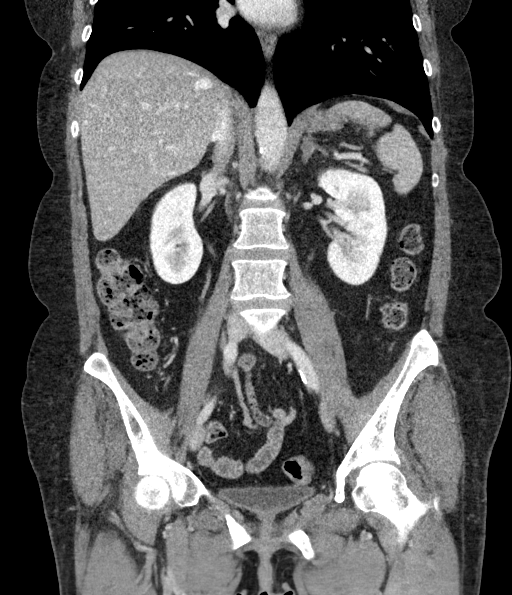

[16 of 46 positions shown; findings below may reference images not displayed]

FINDINGS: Lower chest: No acute abnormality.

Hepatobiliary: Mild hepatic steatosis. No enhancing intrahepatic
mass. No intra or extrahepatic biliary ductal dilation.
Cholecystectomy has been performed.

Pancreas: Unremarkable

Spleen: Unremarkable

Adrenals/Urinary Tract: Adrenal glands are unremarkable. Kidneys are
normal, without renal calculi, focal lesion, or hydronephrosis.
Bladder is unremarkable.

Stomach/Bowel: Stomach is within normal limits. Appendix appears
normal. No evidence of bowel wall thickening, distention, or
inflammatory changes.

Vascular/Lymphatic: Mild aortoiliac atherosclerotic calcification.
No aortic aneurysm. No pathologic adenopathy within the abdomen and
pelvis.

Reproductive: Status post hysterectomy. No adnexal masses.

Other: No abdominal wall hernia.

Musculoskeletal: No acute bone abnormality. No lytic or blastic bone
lesion. Degenerative changes are seen within the lumbar spine.
IMPRESSION: No acute intra-abdominal pathology identified. Normal appendix. No
radiographic explanation for the patient's reported symptoms.

Mild hepatic steatosis.

Aortic Atherosclerosis ([id number]).

## 2022-11-14 MED ORDER — HYDROXYZINE HCL 10 MG PO TABS
10.0000 mg | ORAL_TABLET | Freq: Three times a day (TID) | ORAL | 3 refills | Status: DC | PRN
Start: 2022-11-14 — End: 2023-01-01
  Filled 2022-11-14 – 2022-11-15 (×3): qty 90, 30d supply, fill #0

## 2022-11-14 MED ORDER — NICOTINE POLACRILEX 4 MG MT GUM
4.0000 mg | CHEWING_GUM | OROMUCOSAL | 3 refills | Status: DC | PRN
Start: 2022-11-14 — End: 2023-01-10
  Filled 2022-11-14: qty 100, fill #0

## 2022-11-14 MED ORDER — PALIPERIDONE ER 3 MG PO TB24
3.0000 mg | ORAL_TABLET | Freq: Every day | ORAL | 3 refills | Status: DC
Start: 2022-11-14 — End: 2022-12-30
  Filled 2022-11-14 – 2022-11-15 (×3): qty 30, 30d supply, fill #0
  Filled 2022-12-26: qty 30, 30d supply, fill #1

## 2022-11-14 MED ORDER — DIVALPROEX SODIUM 500 MG PO DR TAB
500.0000 mg | DELAYED_RELEASE_TABLET | Freq: Two times a day (BID) | ORAL | 3 refills | Status: DC
Start: 2022-11-14 — End: 2023-03-17
  Filled 2022-11-14 – 2022-11-15 (×3): qty 60, 30d supply, fill #0
  Filled 2022-12-26: qty 60, 30d supply, fill #1
  Filled 2023-02-07: qty 60, 30d supply, fill #2

## 2022-11-14 NOTE — Progress Notes (Signed)
BH MD/PA/NP OP Progress Note       11/14/2022 4:12 PM Chloe [name]  MRN:  [id number]  Chief Complaint: "I am nervous about tonight"    HPI: 57 year old female seen today for follow-up psychiatric evaluation.  She has a psychiatric history of bipolar affective disorder and tobacco dependence.  She was recently seen at [facility] where she presented on 09/28/2022 to 10/11/2022.  Per chart review patient presented with mania and aggression.  Her medications were adjusted and she is currently managed on Depakote 500 mg twice daily, hydroxyzine 10 mg 3 times daily, and Invega 3 mg daily.  She reports her medications are effective in managing her psychiatric condition.  Today she was well groomed, pleasant, cooperative, and engaged in conversation.  Patient thought process is clear and linear.  She informed Clinical research associate that since her hospitalization she has been staying at the [facility].  She notes that staying here has not been positive as individuals there are territorial and has little room to sleep.  Patient notes that she has allergies and COPD.  She notes that she has been sleeping on the floor and inhaling dust and dander.  She informed Clinical research associate that she has had a cough for the last 3 weeks.  She notes that she was prescribed antibiotics to help manage this.  Provider asked patient if she continues to smoke.  She reports that she does but has cut back her consumption.   Patient informed Clinical research associate that today she will be transferred from the [facility] to [facility] and is anxious about the move.  She is hopeful that she will have her own bed to sleep and that she was sleeping on the floor at [facility].  Patient notes that she misses the accommodations of her brother and sister-in-law's home however does not miss the arguing that happened when she lived there.  She reports that she is hopeful that her brother will visit her this weekend as he has not seen her in 3 weeks.    Patient notes that the above  exacerbates her anxiety and depression.  Today provider conducted a GAD-7 and patient scored a 16.  Provider also conducted PHQ-9 he scored a 14.  She endorsed having adequate appetite and notes that she has gained weight since her last visit.  Today she denies SI/HI/VH, mania, paranoia.  Patient reports that she has back pain due to sleeping on the floor.  She notes that she saw a provider at [facility] and was told that she may need an chest x-ray for her coughing and an MRI and of her back.  Today patient agreeable to starting Nicorette 4 mg gum.  She will continue her other medications as prescribed.  No other concerns noted at this time.      Visit Diagnosis:    ICD-10-CM   1. Tobacco dependency  F17.200 nicotine polacrilex (NICORETTE) 4 MG gum    2. Bipolar affective disorder, depressed, severe, with psychotic behavior (HCC)  F31.5 paliperidone (INVEGA) 3 MG 24 hr tablet    divalproex (DEPAKOTE) 500 MG DR tablet    3. Generalized anxiety disorder  F41.1 hydrOXYzine (ATARAX) 10 MG tablet       Past Psychiatric History: bipolar affective disorder and tobacco dependence, several psychiatric hospitalizations in the past. She was last admitted 25 November 2017 and was noted to have psychotic symptoms along with manic features during that hospital stay. She was discharged on risperidone, carbamazepine and trazodone at that time.  Past Medical  History:  Past Medical History:  Diagnosis Date   Anxiety    Bipolar disorder (HCC)    Breast mass, right    GERD (gastroesophageal reflux disease)    Hyperlipidemia    Post-operative nausea and vomiting    Smoker     Past Surgical History:  Procedure Laterality Date   ABDOMINAL HYSTERECTOMY     had nausea and vomiting post-op   BREAST LUMPECTOMY WITH RADIOACTIVE SEED LOCALIZATION Right 11/12/2016   Procedure: RIGHT BREAST LUMPECTOMY WITH RADIOACTIVE SEED LOCALIZATION;  Surgeon: Harriette Bouillon, MD;  Location: [facility];   Service: General;  Laterality: Right;   CHOLECYSTECTOMY     LAPAROSCOPIC LYSIS OF ADHESIONS     MYOMECTOMY     PLANTAR FASCIA SURGERY Left     Family Psychiatric History: Unknown  Family History:  Family History  Problem Relation Age of Onset   Lung cancer Mother    Breast cancer Maternal Aunt    Hyperlipidemia Brother    Colon cancer Neg Hx    Esophageal cancer Neg Hx    Stomach cancer Neg Hx     Social History:  Social History   Socioeconomic History   Marital status: Divorced    Spouse name: Not on file   Number of children: Not on file   Years of education: Not on file   Highest education level: Associate degree: occupational, Scientist, product/process development, or vocational program  Occupational History   Not on file  Tobacco Use   Smoking status: Every Day    Current packs/day: 0.50    Types: Cigarettes   Smokeless tobacco: Never  Vaping Use   Vaping status: Never Used  Substance and Sexual Activity   Alcohol use: Not Currently    Comment: social   Drug use: No   Sexual activity: Not Currently  Other Topics Concern   Not on file  Social History Narrative   Not on file   Social Determinants of Health   Financial Resource Strain: High Risk (03/26/2022)   Overall Financial Resource Strain (CARDIA)    Difficulty of Paying Living Expenses: Hard  Food Insecurity: No Food Insecurity (09/29/2022)   Hunger Vital Sign    Worried About Running Out of Food in the Last Year: Never true    Ran Out of Food in the Last Year: Never true  Recent Concern: Food Insecurity - Food Insecurity Present (07/01/2022)   Hunger Vital Sign    Worried About Running Out of Food in the Last Year: Often true    Ran Out of Food in the Last Year: Often true  Transportation Needs: No Transportation Needs (09/29/2022)   PRAPARE - Administrator, Civil Service (Medical): No    Lack of Transportation (Non-Medical): No  Recent Concern: Transportation Needs - Unmet Transportation Needs (07/01/2022)    PRAPARE - Transportation    Lack of Transportation (Medical): No    Lack of Transportation (Non-Medical): Yes  Physical Activity: Inactive (03/26/2022)   Exercise Vital Sign    Days of Exercise per Week: 0 days    Minutes of Exercise per Session: 0 min  Stress: Stress Concern Present (03/26/2022)   [employer] of Occupational Health - Occupational Stress Questionnaire    Feeling of Stress : Very much  Social Connections: Socially Isolated (03/26/2022)   Social Connection and Isolation Panel [NHANES]    Frequency of Communication with Friends and Family: Twice a week    Frequency of Social Gatherings with Friends and Family: Once a week  Attends Religious Services: Never    Active Member of Clubs or Organizations: No    Attends Banker Meetings: Never    Marital Status: Divorced    Allergies:  Allergies  Allergen Reactions   Penicillins Hives    Has patient had a PCN reaction causing immediate rash, facial/tongue/throat swelling, SOB or lightheadedness with hypotension: Yes Has patient had a PCN reaction causing severe rash involving mucus membranes or skin necrosis: No Has patient had a PCN reaction that required hospitalization: No Has patient had a PCN reaction occurring within the last 10 years: Yes If all of the above answers are "NO", then may proceed with Cephalosporin use.   Demerol [Meperidine]     Metabolic Disorder Labs: Lab Results  Component Value Date   HGBA1C 5.4 10/01/2022   MPG 108 10/01/2022   MPG 102.54 11/30/2017   Lab Results  Component Value Date   PROLACTIN 12.4 11/30/2017   Lab Results  Component Value Date   CHOL 163 10/01/2022   TRIG 96 10/01/2022   HDL 51 10/01/2022   CHOLHDL 3.2 10/01/2022   VLDL 19 10/01/2022   LDLCALC 93 10/01/2022   LDLCALC 86 04/04/2022   Lab Results  Component Value Date   TSH 1.380 10/01/2022   TSH 0.840 07/01/2022    Therapeutic Level Labs: No results found for: "LITHIUM" No results found  for: "VALPROATE" Lab Results  Component Value Date   CBMZ 8.3 11/30/2017    Current Medications: Current Outpatient Medications  Medication Sig Dispense Refill   nicotine polacrilex (NICORETTE) 4 MG gum Take 1 each (4 mg total) by mouth as needed for smoking cessation. 100 tablet 3   albuterol (VENTOLIN HFA) 108 (90 Base) MCG/ACT inhaler Inhale 2 puffs into the lungs every 6 (six) hours as needed for wheezing or shortness of breath. 8 g 1   divalproex (DEPAKOTE) 500 MG DR tablet Take 1 tablet (500 mg total) by mouth every 12 (twelve) hours. 60 tablet 3   doxycycline (VIBRA-TABS) 100 MG tablet Take 1 tablet (100 mg total) by mouth 2 (two) times daily. 20 tablet 0   hydrOXYzine (ATARAX) 10 MG tablet Take 1 tablet (10 mg total) by mouth 3 (three) times daily as needed for anxiety. 90 tablet 3   omeprazole (PRILOSEC) 20 MG capsule Take 1 capsule (20 mg total) by mouth daily. 30 capsule 2   paliperidone (INVEGA) 3 MG 24 hr tablet Take 1 tablet (3 mg total) by mouth daily. 30 tablet 3   pravastatin (PRAVACHOL) 40 MG tablet Take 1 tablet (40 mg total) by mouth daily. 90 tablet 1   promethazine-dextromethorphan (PROMETHAZINE-DM) 6.25-15 MG/5ML syrup Take 5 mLs by mouth 4 (four) times daily as needed for cough. 240 mL 0   No current facility-administered medications for this visit.     Musculoskeletal: Strength & Muscle Tone: within normal limits,  Gait & Station: normal,  Patient leans: N/A  Psychiatric Specialty Exam: Review of Systems  Blood pressure 120/65, pulse 86, temperature 98 F (36.7 C), height 5\' 1"  (1.549 m), weight 151 lb 6.4 oz (68.7 kg), SpO2 98%.Body mass index is 28.61 kg/m.  General Appearance: Well Groomed  Eye Contact:  Good  Speech:  Clear and Coherent and Pressured  Volume:  Normal  Mood:  Anxious and Depressed nervous about moving to a new place  Affect:  Appropriate and Congruent  Thought Process:  Coherent, Goal Directed, and Linear  Orientation:  Full (Time,  Place, and Person)  Thought Content: WDL  and Logical   Suicidal Thoughts:  No  Homicidal Thoughts:  No  Memory:  Immediate;   Good Recent;   Good Remote;   Good  Judgement:  Good  Insight:  Good  Psychomotor Activity:  Normal  Concentration:  Concentration: Good and Attention Span: Good  Recall:  Good  Fund of Knowledge: Good  Language: Good  Akathisia:  No  Handed:  Right  AIMS (if indicated): not done  Assets:  Communication Skills Desire for Improvement Housing Leisure Time Physical Health Social Support  ADL's:  Intact  Cognition: WNL  Sleep:  Fair   Screenings: AUDIT    Flowsheet Row Admission (Discharged) from 09/28/2022 in [facility]  Alcohol Use Disorder Identification Test Final Score (AUDIT) 0 0      GAD-7    Flowsheet Row Clinical Support from 11/14/2022 in [facility] Office Visit from 07/01/2022 in [facility] Office Visit from 04/04/2022 in [facility] Office Visit from 03/26/2022 in [facility] Video Visit from 03/06/2022 in [facility]  Total GAD-7 Score 16 19 20 21 18       PHQ2-9    Flowsheet Row Clinical Support from 11/14/2022 in [facility] Office Visit from 07/01/2022 in [facility] Office Visit from 04/04/2022 in [facility] Office Visit from 03/26/2022 in [facility] Video Visit from 03/06/2022 in [facility]  PHQ-2 Total Score 6 4 2 2 5   PHQ-9 Total Score 14 21 16 2 21       Flowsheet Row Clinical Support from 11/14/2022 in [facility] ED from 11/04/2022 in [facility] at [facility] ED from 10/26/2022 in [facility] at [facility] RISK CATEGORY Error: Q2 is Yes, you must answer 3, 4, and 5 No Risk No Risk        Assessment and Plan: Patient's thought process is clear and linear.  Today she does not present with symptoms of hypomania.  Since being released from [place] [facility] she notes that her anxiety and depression has improved.  She is somewhat anxious about being homeless and moving to the [employer].  Today patient agreeable to starting Nicorette 4 mg gum.  She will continue all other medications as prescribed.     1. Tobacco dependency  Continue- nicotine polacrilex (NICORETTE) 4 MG gum; Take 1 each (4 mg total) by mouth as needed for smoking cessation.  Dispense: 100 tablet; Refill: 3  2. Bipolar affective disorder, depressed, severe, with psychotic behavior (HCC)  Continue- paliperidone (INVEGA) 3 MG 24 hr tablet; Take 1 tablet (3 mg total) by mouth daily.  Dispense: 30 tablet; Refill: 3 Continue- divalproex (DEPAKOTE) 500 MG DR tablet; Take 1 tablet (500 mg total) by mouth every 12 (twelve) hours.  Dispense: 60 tablet; Refill: 3  3. Generalized anxiety disorder  Continue- hydrOXYzine (ATARAX) 10 MG tablet; Take 1 tablet (10 mg total) by mouth 3 (three) times daily as needed for anxiety.  Dispense: 90 tablet; Refill: 3     Follow-up in 72-month Follow-up with therapy  Shanna Cisco, NP 11/14/2022, 4:12 PM

## 2022-11-15 ENCOUNTER — Other Ambulatory Visit: Payer: Self-pay

## 2022-11-15 ENCOUNTER — Other Ambulatory Visit (HOSPITAL_COMMUNITY): Payer: Self-pay

## 2022-11-18 ENCOUNTER — Other Ambulatory Visit: Payer: Self-pay

## 2022-11-20 ENCOUNTER — Encounter: Payer: Self-pay | Admitting: Physician Assistant

## 2022-11-20 ENCOUNTER — Other Ambulatory Visit: Payer: Self-pay | Admitting: Critical Care Medicine

## 2022-11-20 ENCOUNTER — Other Ambulatory Visit: Payer: Self-pay

## 2022-11-20 MED ORDER — CEFDINIR 300 MG PO CAPS
300.0000 mg | ORAL_CAPSULE | Freq: Two times a day (BID) | ORAL | 0 refills | Status: AC
  Filled 2022-11-20: qty 14, 7d supply, fill #0

## 2022-11-20 MED ORDER — FLUTICASONE-SALMETEROL 100-50 MCG/ACT IN AEPB
1.0000 | INHALATION_SPRAY | Freq: Two times a day (BID) | RESPIRATORY_TRACT | 3 refills | Status: DC
Start: 2022-11-20 — End: 2023-03-06
  Filled 2022-11-20: qty 60, 30d supply, fill #0

## 2022-11-20 NOTE — Congregational Nurse Program (Signed)
  Dept: [phone]   Congregational Nurse Program Note  Date of Encounter: 11/14/2022  Clinic visit for cough and possible allergy symptoms.  Temperature 98.3, respirations 12, O2 Sat 93%, BP 112/80, pulse 80 and regular.  States she has been sleeping on the floor at [facility], tonight will be first night sleeping in a bed at [facility].  Discussed she is to call PCP office or come to MD clinic at [facility] on 11/20/22 if symptoms have not improved. Past Medical History: Past Medical History:  Diagnosis Date   Anxiety    Bipolar disorder (HCC)    Breast mass, right    GERD (gastroesophageal reflux disease)    Hyperlipidemia    Post-operative nausea and vomiting    Smoker     Encounter Details:  CNP Questionnaire - 11/14/22 1058       Questionnaire   Ask client: Do you give verbal consent for me to treat you today? 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

## 2022-11-20 NOTE — Progress Notes (Signed)
Pt seen by Dr Delford Field.  Pt says she did not sign herself out in August, was dropped off here. She had been there for Bipolar sx 08/23. After that, she was at [facility], but had to sleep on the floor. This was very hard for her.   Had COPD exacerbation 09/07  Had abd cellulitis 09/16 and got ABX. Had some diarrhea after that. Needs Depends. Will be given probiotics.   Went to see Gretchen Short, NP for her mental health, got her meds. Is compliant w/ them.  Was 1.5 ppd tobacco, has cut down on that with the gum.   She went to stay w/ her brother and was trying to work.  Over time, her mental health meds were changed.   Pt got URI and saw PW 07/16  Now has a cough w/ greenish sputum. Sometimes clear, nasal sputum. Coughs up brown and green sputum.   Wheezing some, has a Advair and albuterol inhalers. Using them both twice a day.  Bulging eardrum, acute otitis media on L, R ear not as bad but still w/ signs of infection. Nasal turbinate edema and erythema as well. Mouth and throat ok.   Insp/exp wheeze L upper & lower, better on the R.  She will need ABX. She has not had an allergic rxn to PCN  in > 10 yrs. She was given Cefdinir 300 mg bid for 10 days. Report any reactions.   She was given Flonase, saline nasal spray w/ instructions and Tylenol.   She has Medicaid, her brother is helping w/ the copays so she can get her meds.   Phone number updated.   Has PCP f/u appt.    Theodore Demark, PA-C 11/20/2022 2:07 PM

## 2022-11-20 NOTE — Progress Notes (Signed)
Rx OM bronchitis

## 2022-11-21 ENCOUNTER — Other Ambulatory Visit: Payer: Self-pay

## 2022-11-21 ENCOUNTER — Other Ambulatory Visit (HOSPITAL_COMMUNITY): Payer: Self-pay

## 2022-11-21 NOTE — Congregational Nurse Program (Signed)
  Dept: [phone]   Congregational Nurse Program Note  Date of Encounter: 11/21/2022  Clinic visit to check temperature and blood pressure, Continues to have productive cough, lungs no wheezing or rales.  Temperature 98.3, blood pressure 105/70.  Reviewed actions and potential side effects or allergic reaction from antibiotic picked up at the pharmacy.   Past Medical History: Past Medical History:  Diagnosis Date   Anxiety    Bipolar disorder (HCC)    Breast mass, right    GERD (gastroesophageal reflux disease)    Hyperlipidemia    Post-operative nausea and vomiting    Smoker     Encounter Details:  CNP Questionnaire - 11/21/22 1520       Questionnaire   Ask client: Do you give verbal consent for me to treat you today? Yes    Student Assistance N/A    Location Patient Served  [facility]    Visit Setting with Client Organization    Patient Status Unhoused    Insurance Medicaid    Insurance/Financial Assistance Referral N/A    Medication Have Medication Insecurities;Provided Medication Assistance    Medical Provider Yes    Screening Referrals Made N/A    Medical Referrals Made N/A    Medical Appointment Made N/A    Recently w/o PCP, now 1st time PCP visit completed due to CNs referral or appointment made N/A    Food Have Food Insecurities    Transportation Need transportation assistance    Housing/Utilities No permanent housing    Interpersonal Safety Do not feel safe at current residence    Interventions Advocate/Support;Navigate Healthcare System;Counsel;Reviewed Medications    Abnormal to Normal Screening Since Last CN Visit N/A    Screenings CN Performed Blood Pressure;Temperature;Pulse Ox    Sent Client to Lab for: N/A    Did client attend any of the following based off CNs referral or appointments made? N/A    ED Visit Averted N/A    Life-Saving Intervention Made N/A

## 2022-12-10 ENCOUNTER — Encounter: Payer: Self-pay | Admitting: Nurse Practitioner

## 2022-12-10 ENCOUNTER — Ambulatory Visit: Payer: Medicaid Other | Attending: Nurse Practitioner | Admitting: Nurse Practitioner

## 2022-12-10 VITALS — BP 110/77 | HR 77 | Ht 61.0 in | Wt 152.0 lb

## 2022-12-10 DIAGNOSIS — Z09 Encounter for follow-up examination after completed treatment for conditions other than malignant neoplasm: Secondary | ICD-10-CM | POA: Diagnosis not present

## 2022-12-10 DIAGNOSIS — J309 Allergic rhinitis, unspecified: Secondary | ICD-10-CM | POA: Diagnosis not present

## 2022-12-10 DIAGNOSIS — L03311 Cellulitis of abdominal wall: Secondary | ICD-10-CM | POA: Diagnosis not present

## 2022-12-10 DIAGNOSIS — L0390 Cellulitis, unspecified: Secondary | ICD-10-CM | POA: Insufficient documentation

## 2022-12-10 NOTE — Assessment & Plan Note (Signed)
Patient complains of fullness in left ear. She has received doxycycline  100 mg PO bid x 10 days for cellulitis, and Levaquin 500 mg PO x 5 days for COPD exacerbation. Patient has not been using her flonase nasal spray. She was encouraged to resume as previously ordered.

## 2022-12-10 NOTE — Progress Notes (Signed)
Redness has improved, but bumps are still present.

## 2022-12-10 NOTE — Progress Notes (Addendum)
Assessment & Plan:  Chloe [name] was seen today for ed visit follow up.  Diagnoses and all orders for this visit:  Cellulitis of abdominal wall Resolved  Allergic rhinitis, unspecified seasonality, unspecified trigger Use nasal spray daily  Hospital discharge follow-up Follow up as needed    Patient has been counseled on age-appropriate routine health concerns for screening and prevention. These are reviewed and up-to-date. Referrals have been placed accordingly. Immunizations are up-to-date or declined.    Subjective:   Chief Complaint  Patient presents with   ED visit follow up    Chloe [name] 57 y.o. female presents to office today for HFU   Patient presents for ED follow up for abdominal wall cellulitis. She was seen in the ED 11/04/22 and given Doxycycline 100 mg PO bid x 10 days. She is concerned because there are "3 bumps in the area".  Patient recently received bed at [facility]. She says she has been using facility's shower gel. Patient encouraged to soap for sensitive skin. She was also encouraged to wear non-binding clothing that can irritate her abdomen. No obvious signs of cellulitis today on exam.    Patient is also concerned about fullness in her left ear. She has not used her flonase nasal spray.    Review of Systems  Constitutional:  Negative for fever, malaise/fatigue and weight loss.  HENT: Negative.  Negative for nosebleeds.        Left ear fullness  Eyes: Negative.  Negative for blurred vision, double vision and photophobia.  Respiratory: Negative.  Negative for cough and shortness of breath.   Cardiovascular: Negative.  Negative for chest pain, palpitations and leg swelling.  Gastrointestinal: Negative.  Negative for heartburn, nausea and vomiting.  Musculoskeletal: Negative.  Negative for myalgias.  Skin:  Positive for rash.       "Bumps on stomach"  Neurological: Negative.  Negative for dizziness, focal weakness, seizures and headaches.   Psychiatric/Behavioral: Negative.  Negative for suicidal ideas.     Past Medical History:  Diagnosis Date   Anxiety    Bipolar disorder (HCC)    Breast mass, right    GERD (gastroesophageal reflux disease)    Hyperlipidemia    Post-operative nausea and vomiting    Smoker     Past Surgical History:  Procedure Laterality Date   ABDOMINAL HYSTERECTOMY     had nausea and vomiting post-op   BREAST LUMPECTOMY WITH RADIOACTIVE SEED LOCALIZATION Right 11/12/2016   Procedure: RIGHT BREAST LUMPECTOMY WITH RADIOACTIVE SEED LOCALIZATION;  Surgeon: Harriette Bouillon, MD;  Location: [facility];  Service: General;  Laterality: Right;   CHOLECYSTECTOMY     LAPAROSCOPIC LYSIS OF ADHESIONS     MYOMECTOMY     PLANTAR FASCIA SURGERY Left     Family History  Problem Relation Age of Onset   Lung cancer Mother    Breast cancer Maternal Aunt    Hyperlipidemia Brother    Colon cancer Neg Hx    Esophageal cancer Neg Hx    Stomach cancer Neg Hx     Social History Reviewed with no changes to be made today.   Outpatient Medications Prior to Visit  Medication Sig Dispense Refill   albuterol (VENTOLIN HFA) 108 (90 Base) MCG/ACT inhaler Inhale 2 puffs into the lungs every 6 (six) hours as needed for wheezing or shortness of breath. 8 g 1   divalproex (DEPAKOTE) 500 MG DR tablet Take 1 tablet (500 mg total) by mouth every 12 (twelve) hours. 60  tablet 3   fluticasone-salmeterol (ADVAIR) 100-50 MCG/ACT AEPB Inhale 1 puff into the lungs 2 (two) times daily. 60 each 3   omeprazole (PRILOSEC) 20 MG capsule Take 1 capsule (20 mg total) by mouth daily. 30 capsule 2   paliperidone (INVEGA) 3 MG 24 hr tablet Take 1 tablet (3 mg total) by mouth daily. 30 tablet 3   pravastatin (PRAVACHOL) 40 MG tablet Take 1 tablet (40 mg total) by mouth daily. 90 tablet 1   hydrOXYzine (ATARAX) 10 MG tablet Take 1 tablet (10 mg total) by mouth 3 (three) times daily as needed for anxiety. (Patient not taking:  Reported on 12/10/2022) 90 tablet 3   nicotine polacrilex (NICORETTE) 4 MG gum Take 1 each (4 mg total) by mouth as needed for smoking cessation. (Patient not taking: Reported on 12/10/2022) 100 tablet 3   promethazine-dextromethorphan (PROMETHAZINE-DM) 6.25-15 MG/5ML syrup Take 5 mLs by mouth 4 (four) times daily as needed for cough. (Patient not taking: Reported on 12/10/2022) 240 mL 0   No facility-administered medications prior to visit.    Allergies  Allergen Reactions   Penicillins Hives    Has patient had a PCN reaction causing immediate rash, facial/tongue/throat swelling, SOB or lightheadedness with hypotension: Yes Has patient had a PCN reaction causing severe rash involving mucus membranes or skin necrosis: No Has patient had a PCN reaction that required hospitalization: No Has patient had a PCN reaction occurring within the last 10 years: Yes If all of the above answers are "NO", then may proceed with Cephalosporin use.   Demerol [Meperidine]        Objective:    BP 110/77 (BP Location: Left Arm, Patient Position: Sitting, Cuff Size: Normal)   Pulse 77   Ht 5\' 1"  (1.549 m)   Wt 152 lb (68.9 kg)   SpO2 97%   BMI 28.72 kg/m  Wt Readings from Last 3 Encounters:  12/10/22 152 lb (68.9 kg)  09/27/22 136 lb 11.2 oz (62 kg)  09/03/22 132 lb 3.2 oz (60 kg)    Physical Exam Vitals and nursing note reviewed.  Constitutional:      Appearance: She is well-developed.  HENT:     Head: Normocephalic and atraumatic.     Right Ear: Tympanic membrane normal.     Left Ear: Tympanic membrane normal.  Cardiovascular:     Rate and Rhythm: Normal rate and regular rhythm.     Pulses: Normal pulses.     Heart sounds: Normal heart sounds. No murmur heard.    No friction rub. No gallop.  Pulmonary:     Effort: Pulmonary effort is normal. No tachypnea or respiratory distress.     Breath sounds: Normal breath sounds. No decreased breath sounds, wheezing, rhonchi or rales.  Chest:      Chest wall: No tenderness.  Abdominal:     General: Bowel sounds are normal.     Palpations: Abdomen is soft.  Musculoskeletal:        General: Normal range of motion.     Cervical back: Normal range of motion.  Skin:    General: Skin is warm and dry.     Findings: Rash present. Rash is macular (1 macular lesion noted on abdomen inferior to umbillicus; waist band/buttons noted to rub area).     Comments: No obvious signs of cellulitis today  Neurological:     Mental Status: She is alert and oriented to person, place, and time.     Coordination: Coordination normal.  Psychiatric:  Behavior: Behavior normal. Behavior is cooperative.        Thought Content: Thought content normal.        Judgment: Judgment normal.          Patient has been counseled extensively about nutrition and exercise as well as the importance of adherence with medications and regular follow-up. The patient was given clear instructions to go to ER or return to medical center if symptoms don't improve, worsen or new problems develop. The patient verbalized understanding.   Follow-up: Return if symptoms worsen or fail to improve.   I have seen and examined this patient with the advanced practice provider student Burnard Bunting, SFNP, [facility] and agree with the above note .    Claiborne Rigg, FNP-BC [facility] and [facility]ness [place], [place] [phone]   12/10/2022, 3:31 PM

## 2022-12-26 ENCOUNTER — Other Ambulatory Visit: Payer: Self-pay

## 2022-12-27 ENCOUNTER — Other Ambulatory Visit: Payer: Self-pay

## 2022-12-30 ENCOUNTER — Telehealth (HOSPITAL_COMMUNITY): Payer: Self-pay | Admitting: Psychiatry

## 2022-12-30 ENCOUNTER — Other Ambulatory Visit (HOSPITAL_COMMUNITY): Payer: Self-pay | Admitting: Psychiatry

## 2022-12-30 ENCOUNTER — Other Ambulatory Visit: Payer: Self-pay

## 2022-12-30 DIAGNOSIS — F315 Bipolar disorder, current episode depressed, severe, with psychotic features: Secondary | ICD-10-CM

## 2022-12-30 MED ORDER — PALIPERIDONE ER 3 MG PO TB24
3.0000 mg | ORAL_TABLET | Freq: Every day | ORAL | 3 refills | Status: DC
Start: 2022-12-30 — End: 2023-03-17
  Filled 2022-12-30: qty 30, 30d supply, fill #0
  Filled 2023-01-23: qty 30, 30d supply, fill #1
  Filled 2023-02-13 – 2023-02-17 (×2): qty 30, 30d supply, fill #2

## 2022-12-30 NOTE — Telephone Encounter (Signed)
Medication sent to preferred pharmacy

## 2023-01-01 ENCOUNTER — Other Ambulatory Visit: Payer: Self-pay

## 2023-01-01 ENCOUNTER — Other Ambulatory Visit: Payer: Self-pay | Admitting: Critical Care Medicine

## 2023-01-01 ENCOUNTER — Encounter: Payer: Self-pay | Admitting: Physician Assistant

## 2023-01-01 MED ORDER — DOXYCYCLINE HYCLATE 100 MG PO TABS
100.0000 mg | ORAL_TABLET | Freq: Two times a day (BID) | ORAL | 0 refills | Status: AC
  Filled 2023-01-01: qty 10, 5d supply, fill #0

## 2023-01-01 MED ORDER — MUPIROCIN 2 % EX OINT
1.0000 | TOPICAL_OINTMENT | Freq: Two times a day (BID) | CUTANEOUS | 0 refills | Status: DC
  Filled 2023-01-01: qty 22, 11d supply, fill #0

## 2023-01-01 NOTE — Progress Notes (Signed)
Pt c/o sore throat, greenish sinus drainage, no known fevers.   Feels like her glands are swollen at times.   +erythema in her throat. Needs ABX for this.   C/o rash lower gluteal area above anus, skin infection, will need ABX.  Has been putting Bendadryl cream on it w/out much change.  Had diarrhea recently because of successive ABX, starting w/ Zpack, other names not known.   Was given probiotics, saline nasal rinse.   Doxy, and mupirocin/bactroban cream were sent in.   Theodore Demark, PA-C 01/01/2023 2:11 PM

## 2023-01-01 NOTE — Progress Notes (Signed)
Rx for infection.

## 2023-01-06 ENCOUNTER — Other Ambulatory Visit: Payer: Self-pay

## 2023-01-06 ENCOUNTER — Encounter: Payer: Self-pay | Admitting: Nurse Practitioner

## 2023-01-06 ENCOUNTER — Ambulatory Visit: Payer: MEDICAID | Attending: Nurse Practitioner | Admitting: Nurse Practitioner

## 2023-01-06 ENCOUNTER — Ambulatory Visit: Payer: Self-pay

## 2023-01-06 ENCOUNTER — Encounter: Payer: Self-pay | Admitting: *Deleted

## 2023-01-06 VITALS — BP 123/80 | HR 81 | Ht 61.0 in | Wt 157.4 lb

## 2023-01-06 DIAGNOSIS — F17210 Nicotine dependence, cigarettes, uncomplicated: Secondary | ICD-10-CM

## 2023-01-06 DIAGNOSIS — Z5901 Sheltered homelessness: Secondary | ICD-10-CM

## 2023-01-06 DIAGNOSIS — J441 Chronic obstructive pulmonary disease with (acute) exacerbation: Secondary | ICD-10-CM | POA: Diagnosis not present

## 2023-01-06 MED ORDER — ALBUTEROL SULFATE (2.5 MG/3ML) 0.083% IN NEBU
2.5000 mg | INHALATION_SOLUTION | Freq: Four times a day (QID) | RESPIRATORY_TRACT | 1 refills | Status: DC | PRN
Start: 2023-01-06 — End: 2023-06-10
  Filled 2023-01-06: qty 180, 15d supply, fill #0

## 2023-01-06 MED ORDER — PREDNISONE 10 MG (21) PO TBPK
ORAL_TABLET | ORAL | 0 refills | Status: DC
Start: 2023-01-06 — End: 2023-01-15
  Filled 2023-01-06: qty 21, 6d supply, fill #0

## 2023-01-06 MED ORDER — FLUTICASONE PROPIONATE 50 MCG/ACT NA SUSP
2.0000 | Freq: Every day | NASAL | 1 refills | Status: DC
Start: 2023-01-06 — End: 2023-03-06
  Filled 2023-01-06: qty 16, 30d supply, fill #0

## 2023-01-06 MED ORDER — IPRATROPIUM-ALBUTEROL 0.5-2.5 (3) MG/3ML IN SOLN
3.0000 mL | Freq: Once | RESPIRATORY_TRACT | Status: AC
Start: 2023-01-06 — End: 2023-01-06
  Administered 2023-01-06: 3 mL via RESPIRATORY_TRACT

## 2023-01-06 NOTE — Congregational Nurse Program (Signed)
  Dept: [phone]   Congregational Nurse Program Note  Date of Encounter: 01/06/2023  Past Medical History: Past Medical History:  Diagnosis Date   Anxiety    Bipolar disorder (HCC)    Breast mass, right    GERD (gastroesophageal reflux disease)    Hyperlipidemia    Post-operative nausea and vomiting    Smoker     Encounter Details:  Community Questionnaire - 01/06/23 1000       Questionnaire   Ask client: Do you give verbal consent for me to treat you today? Yes    Student Assistance N/A    Location Patient Served  [facility]    Encounter Setting CN site    Population Status Unhoused    Insurance Medicaid    Insurance/Financial Assistance Referral N/A    Medication N/A    Medical Provider Yes    Screening Referrals Made N/A    Medical Referrals Made [facility]    Medical Appointment Completed [facility]    CNP Interventions Advocate/Support;Navigate Healthcare System;Case Management    Screenings CN Performed Blood Pressure    ED Visit Averted N/A    Life-Saving Intervention Made N/A           Client came to nurse's office c/o shortness of breath ongoing but has gotten worse in past couple of days. Checked vitals and listened to lung sounds. Blood pressure 111/76, pulse 87, SpO2 94%. Wheezing noted. Contacted PCP office and client was scheduled to be seen today. Collaborated with SW at [facility] to set up transportation for visit. Offered support and encouragement. Milka Windholz W RN CN

## 2023-01-06 NOTE — Progress Notes (Signed)
Assessment & Plan:  Chloe [name] was seen today for medical management of chronic issues.  Diagnoses and all orders for this visit:  Chronic obstructive pulmonary disease with acute exacerbation  Given nebulizer treatment today in office. -     fluticasone (FLONASE) 50 MCG/ACT nasal spray; Place 2 sprays into both nostrils daily. -     albuterol (PROVENTIL) (2.5 MG/3ML) 0.083% nebulizer solution; Take 3 mLs (2.5 mg total) by nebulization every 6 (six) hours as needed for wheezing or shortness of breath. -     For home use only DME Nebulizer machine -     ipratropium-albuterol (DUONEB) 0.5-2.5 (3) MG/3ML nebulizer solution 3 mL 6-day steroid taper of prednisone    Patient has been counseled on age-appropriate routine health concerns for screening and prevention. These are reviewed and up-to-date. Referrals have been placed accordingly. Immunizations are up-to-date or declined.    Subjective:   Chief Complaint  Patient presents with   Medical Management of Chronic Issues    Chloe [name] 57 y.o. female presents to office today with persistent cough and wheezing.  She is a patient of Dr. Delford Field and has a history of COPD.  Currently smoking up to 1ppd of cigarettes.  COPD: Patient complains of dyspnea, cough, wheezing, increased sputum, and nasal congestion. Symptoms began a few months ago. Sputum is  difficult to expectorate . Fever has been  absent .  Patient currently is not on home oxygen therapy.Marland Kitchen Respiratory history: COPD. She currently lives in a homeless shelter. She does not own a nebulizer machine or have nebs on file.     Review of Systems  Constitutional:  Negative for fever, malaise/fatigue and weight loss.  HENT:  Positive for congestion. Negative for nosebleeds.   Eyes: Negative.  Negative for blurred vision, double vision and photophobia.  Respiratory:  Positive for cough, sputum production, shortness of breath and wheezing.   Cardiovascular: Negative.  Negative for  chest pain, palpitations and leg swelling.  Gastrointestinal: Negative.  Negative for heartburn, nausea and vomiting.  Musculoskeletal: Negative.  Negative for myalgias.  Neurological: Negative.  Negative for dizziness, focal weakness, seizures and headaches.  Psychiatric/Behavioral: Negative.  Negative for suicidal ideas.     Past Medical History:  Diagnosis Date   Anxiety    Bipolar disorder (HCC)    Breast mass, right    GERD (gastroesophageal reflux disease)    Hyperlipidemia    Post-operative nausea and vomiting    Smoker     Past Surgical History:  Procedure Laterality Date   ABDOMINAL HYSTERECTOMY     had nausea and vomiting post-op   BREAST LUMPECTOMY WITH RADIOACTIVE SEED LOCALIZATION Right 11/12/2016   Procedure: RIGHT BREAST LUMPECTOMY WITH RADIOACTIVE SEED LOCALIZATION;  Surgeon: Harriette Bouillon, MD;  Location: [facility];  Service: General;  Laterality: Right;   CHOLECYSTECTOMY     LAPAROSCOPIC LYSIS OF ADHESIONS     MYOMECTOMY     PLANTAR FASCIA SURGERY Left     Family History  Problem Relation Age of Onset   Lung cancer Mother    Breast cancer Maternal Aunt    Hyperlipidemia Brother    Colon cancer Neg Hx    Esophageal cancer Neg Hx    Stomach cancer Neg Hx     Social History Reviewed with no changes to be made today.   Outpatient Medications Prior to Visit  Medication Sig Dispense Refill   albuterol (VENTOLIN HFA) 108 (90 Base) MCG/ACT inhaler Inhale 2 puffs into the lungs every  6 (six) hours as needed for wheezing or shortness of breath. 8 g 1   divalproex (DEPAKOTE) 500 MG DR tablet Take 1 tablet (500 mg total) by mouth every 12 (twelve) hours. 60 tablet 3   doxycycline (VIBRA-TABS) 100 MG tablet Take 1 tablet (100 mg total) by mouth 2 (two) times daily for 5 days. 10 tablet 0   fluticasone-salmeterol (ADVAIR) 100-50 MCG/ACT AEPB Inhale 1 puff into the lungs 2 (two) times daily. 60 each 3   mupirocin ointment (BACTROBAN) 2 % Apply 1  Application topically 2 (two) times daily. To affected areas 22 g 0   nicotine polacrilex (NICORETTE) 4 MG gum Take 1 each (4 mg total) by mouth as needed for smoking cessation. 100 tablet 3   omeprazole (PRILOSEC) 20 MG capsule Take 1 capsule (20 mg total) by mouth daily. 30 capsule 2   paliperidone (INVEGA) 3 MG 24 hr tablet Take 1 tablet (3 mg total) by mouth daily. 30 tablet 3   pravastatin (PRAVACHOL) 40 MG tablet Take 1 tablet (40 mg total) by mouth daily. 90 tablet 1   No facility-administered medications prior to visit.    Allergies  Allergen Reactions   Penicillins Hives    Has patient had a PCN reaction causing immediate rash, facial/tongue/throat swelling, SOB or lightheadedness with hypotension: Yes Has patient had a PCN reaction causing severe rash involving mucus membranes or skin necrosis: No Has patient had a PCN reaction that required hospitalization: No Has patient had a PCN reaction occurring within the last 10 years: Yes If all of the above answers are "NO", then may proceed with Cephalosporin use.   Demerol [Meperidine]        Objective:    BP 123/80 (BP Location: Left Arm, Patient Position: Sitting, Cuff Size: Normal)   Pulse 81   Ht 5\' 1"  (1.549 m)   Wt 157 lb 6.4 oz (71.4 kg)   SpO2 95%   BMI 29.74 kg/m  Wt Readings from Last 3 Encounters:  01/06/23 157 lb 6.4 oz (71.4 kg)  12/10/22 152 lb (68.9 kg)  09/27/22 136 lb 11.2 oz (62 kg)    Physical Exam Vitals and nursing note reviewed.  Constitutional:      Appearance: She is well-developed.  HENT:     Head: Normocephalic and atraumatic.  Cardiovascular:     Rate and Rhythm: Normal rate and regular rhythm.     Heart sounds: Normal heart sounds. No murmur heard.    No friction rub. No gallop.  Pulmonary:     Effort: Pulmonary effort is normal. No tachypnea or respiratory distress.     Breath sounds: Decreased air movement present. Wheezing present. No decreased breath sounds, rhonchi or rales.   Chest:     Chest wall: No tenderness.  Abdominal:     General: Bowel sounds are normal.     Palpations: Abdomen is soft.  Musculoskeletal:        General: Normal range of motion.     Cervical back: Normal range of motion.  Skin:    General: Skin is warm and dry.  Neurological:     Mental Status: She is alert and oriented to person, place, and time.     Coordination: Coordination normal.  Psychiatric:        Behavior: Behavior normal. Behavior is cooperative.        Thought Content: Thought content normal.        Judgment: Judgment normal.  Patient has been counseled extensively about nutrition and exercise as well as the importance of adherence with medications and regular follow-up. The patient was given clear instructions to go to ER or return to medical center if symptoms don't improve, worsen or new problems develop. The patient verbalized understanding.   Follow-up: Return if symptoms worsen or fail to improve.   Claiborne Rigg, FNP-BC [facility] and Wellness [place], [place] [phone]   01/06/2023, 12:16 PM

## 2023-01-06 NOTE — Telephone Encounter (Signed)
  Chief Complaint: wheezing Symptoms: wheezing and cough Frequency: several weeks but getting worse Pertinent Negatives: NA Disposition: [] ED /[] Urgent Care (no appt availability in office) / [x] Appointment(In office/virtual)/ []  [facility]/ [] Home Care/ [] Refused Recommended Disposition /[] [facility]/ []  Follow-up with PCP Additional Notes: Jan, RN from [employer], Engineer, water Program calling to report pt sx. Pt is in to see her today and states pt has been on recent abx but sx aren't improving. Asking for appt. Scheduled OV with Zelda, NP at 1110.   Reason for Disposition  [1] MILD difficulty breathing (e.g., minimal/no SOB at rest, SOB with walking, pulse <100) AND [2] NEW-onset or WORSE than normal  Answer Assessment - Initial Assessment Questions 1. RESPIRATORY STATUS: "Describe your breathing?" (e.g., wheezing, shortness of breath, unable to speak, severe coughing)      Wheezing and coughing 2. ONSET: "When did this breathing problem begin?"      Ongoing several weeks but getting worse 3. PATTERN "Does the difficult breathing come and go, or has it been constant since it started?"      Constant this morning  4. SEVERITY: "How bad is your breathing?" (e.g., mild, moderate, severe)    - MILD: No SOB at rest, mild SOB with walking, speaks normally in sentences, can lie down, no retractions, pulse < 100.    - MODERATE: SOB at rest, SOB with minimal exertion and prefers to sit, cannot lie down flat, speaks in phrases, mild retractions, audible wheezing, pulse 100-120.    - SEVERE: Very SOB at rest, speaks in single words, struggling to breathe, sitting hunched forward, retractions, pulse > 120      Mild to moderate  5. RECURRENT SYMPTOM: "Have you had difficulty breathing before?" If Yes, ask: "When was the last time?" and "What happened that time?"      Yes has been on abx  9. OTHER SYMPTOMS: "Do you have any other symptoms? (e.g., dizziness, runny  nose, cough, chest pain, fever)     cough  Protocols used: Breathing Difficulty-A-AH

## 2023-01-07 ENCOUNTER — Other Ambulatory Visit: Payer: Self-pay

## 2023-01-08 ENCOUNTER — Observation Stay (HOSPITAL_BASED_OUTPATIENT_CLINIC_OR_DEPARTMENT_OTHER)
Admission: EM | Admit: 2023-01-08 | Discharge: 2023-01-10 | Disposition: A | Discharge status: Home / Self Care | Payer: MEDICAID | Attending: Family Medicine | Admitting: Family Medicine

## 2023-01-08 ENCOUNTER — Encounter: Payer: Self-pay | Admitting: Physician Assistant

## 2023-01-08 ENCOUNTER — Emergency Department (HOSPITAL_BASED_OUTPATIENT_CLINIC_OR_DEPARTMENT_OTHER): Payer: MEDICAID | Admitting: Radiology

## 2023-01-08 DIAGNOSIS — Z5900 Homelessness unspecified: Secondary | ICD-10-CM | POA: Diagnosis not present

## 2023-01-08 DIAGNOSIS — Z1152 Encounter for screening for COVID-19: Secondary | ICD-10-CM | POA: Diagnosis not present

## 2023-01-08 DIAGNOSIS — Z7951 Long term (current) use of inhaled steroids: Secondary | ICD-10-CM | POA: Diagnosis not present

## 2023-01-08 DIAGNOSIS — F17210 Nicotine dependence, cigarettes, uncomplicated: Secondary | ICD-10-CM | POA: Diagnosis not present

## 2023-01-08 DIAGNOSIS — E872 Acidosis, unspecified: Secondary | ICD-10-CM | POA: Insufficient documentation

## 2023-01-08 DIAGNOSIS — J441 Chronic obstructive pulmonary disease with (acute) exacerbation: Principal | ICD-10-CM | POA: Diagnosis present

## 2023-01-08 DIAGNOSIS — Z79899 Other long term (current) drug therapy: Secondary | ICD-10-CM | POA: Diagnosis not present

## 2023-01-08 DIAGNOSIS — R0602 Shortness of breath: Secondary | ICD-10-CM | POA: Diagnosis present

## 2023-01-08 LAB — RESP PANEL BY RT-PCR (RSV, FLU A&B, COVID)  RVPGX2
Influenza A by PCR: NEGATIVE
Influenza B by PCR: NEGATIVE
Resp Syncytial Virus by PCR: NEGATIVE
SARS Coronavirus 2 by RT PCR: NEGATIVE

## 2023-01-08 LAB — COMPREHENSIVE METABOLIC PANEL
ALT: 17 U/L (ref 0–44)
AST: 15 U/L (ref 15–41)
Albumin: 4.3 g/dL (ref 3.5–5.0)
Alkaline Phosphatase: 76 U/L (ref 38–126)
Anion gap: 10 (ref 5–15)
BUN: 12 mg/dL (ref 6–20)
CO2: 29 mmol/L (ref 22–32)
Calcium: 9.7 mg/dL (ref 8.9–10.3)
Chloride: 99 mmol/L (ref 98–111)
Creatinine, Ser: 0.5 mg/dL (ref 0.44–1.00)
GFR, Estimated: >60 mL/min (ref >60)
Glucose, Bld: 149 mg/dL — ABNORMAL HIGH (ref 70–99)
Potassium: 3.4 mmol/L — ABNORMAL LOW (ref 3.5–5.1)
Sodium: 138 mmol/L (ref 135–145)
Total Bilirubin: 0.3 mg/dL (ref <1.2)
Total Protein: 7.6 g/dL (ref 6.5–8.1)

## 2023-01-08 LAB — URINALYSIS, W/ REFLEX TO CULTURE (INFECTION SUSPECTED)
Bacteria, UA: NONE SEEN
Bilirubin Urine: NEGATIVE
Glucose, UA: NEGATIVE mg/dL
Hgb urine dipstick: NEGATIVE
Ketones, ur: NEGATIVE mg/dL
Leukocytes,Ua: NEGATIVE
Nitrite: NEGATIVE
Protein, ur: NEGATIVE mg/dL
Specific Gravity, Urine: 1.007 (ref 1.005–1.030)
pH: 5.5 (ref 5.0–8.0)

## 2023-01-08 LAB — CBC WITH DIFFERENTIAL/PLATELET
Abs Immature Granulocytes: 0.05 10*3/uL (ref 0.00–0.07)
Basophils Absolute: 0.1 10*3/uL (ref 0.0–0.1)
Basophils Relative: 1 %
Eosinophils Absolute: 0.3 10*3/uL (ref 0.0–0.5)
Eosinophils Relative: 3 %
HCT: 42.8 % (ref 36.0–46.0)
Hemoglobin: 14.5 g/dL (ref 12.0–15.0)
Immature Granulocytes: 0 %
Lymphocytes Relative: 29 %
Lymphs Abs: 3.4 10*3/uL (ref 0.7–4.0)
MCH: 31.1 pg (ref 26.0–34.0)
MCHC: 33.9 g/dL (ref 30.0–36.0)
MCV: 91.8 fL (ref 80.0–100.0)
Monocytes Absolute: 0.7 10*3/uL (ref 0.1–1.0)
Monocytes Relative: 6 %
Neutro Abs: 7.1 10*3/uL (ref 1.7–7.7)
Neutrophils Relative %: 61 %
Platelets: 337 10*3/uL (ref 150–400)
RBC: 4.66 MIL/uL (ref 3.87–5.11)
RDW: 13 % (ref 11.5–15.5)
WBC: 11.6 10*3/uL — ABNORMAL HIGH (ref 4.0–10.5)
nRBC: 0 % (ref 0.0–0.2)

## 2023-01-08 LAB — LACTIC ACID, PLASMA
Lactic Acid, Venous: 2.5 mmol/L (ref 0.5–1.9)
Lactic Acid, Venous: 5.4 mmol/L (ref 0.5–1.9)
Lactic Acid, Venous: 3.6 mmol/L (ref 0.5–1.9)

## 2023-01-08 MED ORDER — ALBUTEROL SULFATE (2.5 MG/3ML) 0.083% IN NEBU
5.0000 mg | INHALATION_SOLUTION | Freq: Once | RESPIRATORY_TRACT | Status: AC
  Administered 2023-01-08: 5 mg via RESPIRATORY_TRACT
  Filled 2023-01-08: qty 6

## 2023-01-08 MED ORDER — IPRATROPIUM-ALBUTEROL 0.5-2.5 (3) MG/3ML IN SOLN
3.0000 mL | Freq: Once | RESPIRATORY_TRACT | Status: AC
  Administered 2023-01-08: 3 mL via RESPIRATORY_TRACT
  Filled 2023-01-08: qty 3

## 2023-01-08 MED ORDER — ALBUTEROL SULFATE (2.5 MG/3ML) 0.083% IN NEBU
5.0000 mg | INHALATION_SOLUTION | RESPIRATORY_TRACT | Status: DC
  Administered 2023-01-08: 5 mg via RESPIRATORY_TRACT
  Filled 2023-01-08: qty 6

## 2023-01-08 MED ORDER — LACTATED RINGERS IV BOLUS
1000.0000 mL | Freq: Once | INTRAVENOUS | Status: AC
Start: 2023-01-08 — End: 2023-01-08
  Administered 2023-01-08: 1000 mL via INTRAVENOUS

## 2023-01-08 MED ORDER — LACTATED RINGERS IV SOLN: INTRAVENOUS | Status: DC

## 2023-01-08 MED ORDER — IPRATROPIUM BROMIDE 0.02 % IN SOLN
0.5000 mg | Freq: Once | RESPIRATORY_TRACT | Status: AC
  Administered 2023-01-08: 0.5 mg via RESPIRATORY_TRACT
  Filled 2023-01-08: qty 2.5

## 2023-01-08 NOTE — Progress Notes (Addendum)
57 yo with COPD and tobacco abuse as well as bipolar disorder here due to continued malaise and shortness of breath.  Difficult to pin down on how long symptoms have been going on, but it sounds like she hasn't really felt good since August.  She was recently treated with antibiotics (doxycycline on 11/13) and just saw her PCP on 11/18 and was prescribed steroid taper (but hasn't started this yet - she mistakenly thought she couldn't take together with antibiotics).  She isn't sure about fevers/chills.  Notes CP with cough.  Notes SOB with exertion (esp with stairs).  She was recently given a nebulizer and needs help with how to use it (she needs some general education today with all of her meds and COPD meds and how to appropriately use).  She was wheezing diffusely on exam  and her wheezing did not significantly improve despite 3 albuterol nebs being given in clinic.  We also had her take her first dose of steroids today (60 mg prednisone).  Due to her persistent wheezing and apparent discomfort on exam with SOB, we sent her to the ED for further evaluation.    Vitals:   01/08/23 1504  BP: 139/85  Pulse: 68  Temp: 97.7 F (36.5 C)  SpO2: 94%   Mildly uncomfortable appearing with SOB RRR Diffuse wheezing on exam -> this is persistent on multiple exams after her breathing treatments  Lacretia Nicks, MD See below for additional details  -------------------------------  Pt seen 11/18 by her PCP.  She got oral steroids >> has not started them yet since she was still on the ABX. Has not started the Flonase nasal spray.   Got a nebulizer machine, but does not know how to use it. Dr Lowell Guitar helped her understand how to use it and she was able to put it together.   O2 sats were as low as 89% during the treatment. They did not improve much w/ the first neb. Pt set up to get another one with doubled medication.   With the 2nd neb, her sats improved some, but still not up to previous levels.   Had  Advair that she has been using consistently.   Has been struggling to climb stairs due to SOB and wheezing.   Thinks she might have a fever, but temp nl at 97.7  I big concern is that she may get worse overnight. She has not been consistent with her meds and her condition is tenuous due to this. Also, no CXR has been done.   The best option is for her to go to the ER, [facility] preferred, and be treated.  Theodore Demark, PA-C 01/08/2023 4:35 PM

## 2023-01-08 NOTE — ED Triage Notes (Signed)
Pt [facility] from [facility], ambulatory c/o SOB x a few days, but further states she has "been sick with non-productive cough/congestion for months." Pt further reports she was prescribed prednisone and given 3 neb txs today by provider at the shelter. Denies fevers, N/V/D.

## 2023-01-08 NOTE — ED Notes (Signed)
Patient placed on 2LNC at this time due to desaturations to 88-89% while sleeping. RN aware.

## 2023-01-08 NOTE — ED Provider Notes (Signed)
[place] EMERGENCY DEPARTMENT AT [facility] Provider Note   CSN: [id number] Arrival date & time: 01/08/23  1715     History  Chief Complaint  Patient presents with   Shortness of Breath    Chloe [name] is a 57 y.o. female.  Patient is a 57 year old female with history of bipolar disease, ongoing tobacco use, COPD on Advair daily who is presenting today with persistent shortness of breath, cough and feeling unwell.  Patient reports since August she has been intermittently sick with no more than 1 week of feeling well between feeling unwell but reports this particular episode started probably about a week ago.  About 2 weeks ago she got her flu shot and states shortly after that she got a sore throat which seemed to improve but then she started having cough, shortness of breath and wheezing.  Patient was seen by her doctor 2 days ago and by Dr. Delford Field with pulmonology.  She was given a nebulizer and reports she was given some treatments and thought she was getting a little better but started feeling worse today.  She took her first dose of prednisone today and [facility] and was given 3 breathing treatments per Dr. Elijah Birk who sent a secure chat but states she continued to have wheezing.  Patient reports still feeling tired, shortness of breath and wheezy.  She has not had a fever recently but does report that she is homeless and currently staying in a shelter and shares sleeping quarters with 14 other people which she reports all have been sick recently.  The history is provided by the patient.  Shortness of Breath      Home Medications Prior to Admission medications   Medication Sig Start Date End Date Taking? Authorizing Provider  albuterol (PROVENTIL) (2.5 MG/3ML) 0.083% nebulizer solution Take 3 mLs (2.5 mg total) by nebulization every 6 (six) hours as needed for wheezing or shortness of breath. 01/06/23   Claiborne Rigg, NP  albuterol (VENTOLIN  HFA) 108 (90 Base) MCG/ACT inhaler Inhale 2 puffs into the lungs every 6 (six) hours as needed for wheezing or shortness of breath. 10/26/22   Rising, Lurena Joiner, PA-C  divalproex (DEPAKOTE) 500 MG DR tablet Take 1 tablet (500 mg total) by mouth every 12 (twelve) hours. 11/14/22   Shanna Cisco, NP  fluticasone (FLONASE) 50 MCG/ACT nasal spray Place 2 sprays into both nostrils daily. 01/06/23   Claiborne Rigg, NP  fluticasone-salmeterol (ADVAIR) 100-50 MCG/ACT AEPB Inhale 1 puff into the lungs 2 (two) times daily. 11/20/22   Storm Frisk, MD  mupirocin ointment (BACTROBAN) 2 % Apply 1 Application topically 2 (two) times daily. To affected areas 01/01/23   Storm Frisk, MD  nicotine polacrilex (NICORETTE) 4 MG gum Take 1 each (4 mg total) by mouth as needed for smoking cessation. 11/14/22   Shanna Cisco, NP  omeprazole (PRILOSEC) 20 MG capsule Take 1 capsule (20 mg total) by mouth daily. 09/03/22   Storm Frisk, MD  paliperidone (INVEGA) 3 MG 24 hr tablet Take 1 tablet (3 mg total) by mouth daily. 12/30/22   Shanna Cisco, NP  pravastatin (PRAVACHOL) 40 MG tablet Take 1 tablet (40 mg total) by mouth daily. 09/03/22   Storm Frisk, MD  predniSONE (STERAPRED UNI-PAK 21 TAB) 10 MG (21) TBPK tablet TAKE AS DIRECTED ON FOIL PACKET 01/06/23   Claiborne Rigg, NP      Allergies    Penicillins  and Demerol [meperidine]    Review of Systems   Review of Systems  Respiratory:  Positive for shortness of breath.     Physical Exam Updated Vital Signs BP 129/73   Pulse 96   Temp 98.1 F (36.7 C)   Resp 17   SpO2 95%  Physical Exam Vitals and nursing note reviewed.  Constitutional:      General: She is not in acute distress.    Appearance: She is well-developed.  HENT:     Head: Normocephalic and atraumatic.  Eyes:     Pupils: Pupils are equal, round, and reactive to light.  Cardiovascular:     Rate and Rhythm: Normal rate and regular rhythm.     Heart sounds:  Normal heart sounds. No murmur heard.    No friction rub.  Pulmonary:     Effort: Pulmonary effort is normal. Tachypnea present.     Breath sounds: Wheezing present. No rales.  Abdominal:     General: Bowel sounds are normal. There is no distension.     Palpations: Abdomen is soft.     Tenderness: There is no abdominal tenderness. There is no guarding or rebound.  Musculoskeletal:        General: No tenderness. Normal range of motion.     Right lower leg: No tenderness.     Left lower leg: No tenderness.     Comments: No edema  Skin:    General: Skin is warm and dry.     Findings: No rash.  Neurological:     Mental Status: She is alert and oriented to person, place, and time.     Cranial Nerves: No cranial nerve deficit.  Psychiatric:        Behavior: Behavior normal.     ED Results / Procedures / Treatments   Labs (all labs ordered are listed, but only abnormal results are displayed) Labs Reviewed  LACTIC ACID, PLASMA - Abnormal; Notable for the following components:      Result Value   Lactic Acid, Venous 2.5 (*)    All other components within normal limits  COMPREHENSIVE METABOLIC PANEL - Abnormal; Notable for the following components:   Potassium 3.4 (*)    Glucose, Bld 149 (*)    All other components within normal limits  CBC WITH DIFFERENTIAL/PLATELET - Abnormal; Notable for the following components:   WBC 11.6 (*)    All other components within normal limits  URINALYSIS, W/ REFLEX TO CULTURE (INFECTION SUSPECTED) - Abnormal; Notable for the following components:   Color, Urine COLORLESS (*)    All other components within normal limits  RESP PANEL BY RT-PCR (RSV, FLU A&B, COVID)  [id number]  LACTIC ACID, PLASMA    EKG EKG Interpretation Date/Time:  Wednesday January 08 2023 17:22:25 EST Ventricular Rate:  87 PR Interval:  174 QRS Duration:  84 QT Interval:  352 QTC Calculation: 423 R Axis:   62  Text Interpretation: Normal sinus rhythm Normal ECG When  compared with ECG of 04-Oct-2022 16:08, No significant change was found Confirmed by Gwyneth Sprout ([id number]) on 01/08/2023 6:41:57 PM  Radiology No results found.  Procedures Procedures    Medications Ordered in ED Medications  albuterol (PROVENTIL) (2.5 MG/3ML) 0.083% nebulizer solution 5 mg (has no administration in time range)  ipratropium-albuterol (DUONEB) 0.5-2.5 (3) MG/3ML nebulizer solution 3 mL (3 mLs Nebulization Given 01/08/23 1730)  albuterol (PROVENTIL) (2.5 MG/3ML) 0.083% nebulizer solution 5 mg (5 mg Nebulization Given 01/08/23 1829)  ipratropium (ATROVENT) nebulizer solution 0.5  mg (0.5 mg Nebulization Given 01/08/23 1829)  lactated ringers bolus 1,000 mL (0 mLs Intravenous Stopped 01/08/23 2002)    ED Course/ Medical Decision Making/ A&P                                 Medical Decision Making Amount and/or Complexity of Data Reviewed External Data Reviewed: notes. Labs: ordered. Decision-making details documented in ED Course. Radiology: ordered and independent interpretation performed. Decision-making details documented in ED Course. ECG/medicine tests: ordered and independent interpretation performed. Decision-making details documented in ED Course.  Risk Prescription drug management. Decision regarding hospitalization.   Pt with multiple medical problems and comorbidities and presenting today with a complaint that caries a high risk for morbidity and mortality.  Here today with persistent wheezing and shortness of breath.  [facility] prior to this and had multiple breathing treatments.  Concern for new viral etiology and COPD exacerbation versus pneumonia.  Is currently homeless and has stayed in several shelters but does not have specific complaints concerning for TB at this time.  Low suspicion for DVT/PE.  Patient sats are between 90 and 93% on room air but she continues to have ongoing wheezing.  Will give another albuterol and  Atrovent.  She took her first dose of steroids today do not feel that she needs additional steroids IV.  I independently interpreted patient's labs and respiratory panel was negative, lactic acid was mildly elevated at 2.5 which may be related to recent albuterol use as patient is not displaying septic symptoms at this time.  CMP without acute findings and CBC with mild leukocytosis of 11.6 and urine is negative.  I independently interpreted patient's EKG which showed no acute findings.  Patient reports that she has been on 4 different antibiotics since August but appears that most of those were for a cellulitis.  I have independently visualized and interpreted pt's images today.  Chest x-ray today without any obvious consolidation.  No findings to suspect CHF, pericardial effusion or myocarditis.  Given patient's symptoms are persistent and this is now her third visit to a provider in 3 days feel that she would benefit from admission for ongoing treatment of COPD exacerbation.  8:22 PM After neb here patient is moving air better but still has diffuse wheezing.  This is now her fifth treatment this afternoon.  Consulted hospitalist for admission.  Patient is comfortable with this plan.         Final Clinical Impression(s) / ED Diagnoses Final diagnoses:  COPD with acute exacerbation [facility])    Rx / DC Orders ED Discharge Orders     None         Gwyneth Sprout, MD 01/08/23 2022

## 2023-01-08 NOTE — ED Notes (Signed)
RT Note: Patient tolerating the nebulizer treatment well at this time

## 2023-01-09 DIAGNOSIS — Z7951 Long term (current) use of inhaled steroids: Secondary | ICD-10-CM | POA: Diagnosis not present

## 2023-01-09 DIAGNOSIS — J441 Chronic obstructive pulmonary disease with (acute) exacerbation: Secondary | ICD-10-CM | POA: Diagnosis not present

## 2023-01-09 DIAGNOSIS — E872 Acidosis, unspecified: Secondary | ICD-10-CM | POA: Diagnosis not present

## 2023-01-09 DIAGNOSIS — Z1152 Encounter for screening for COVID-19: Secondary | ICD-10-CM | POA: Diagnosis not present

## 2023-01-09 DIAGNOSIS — F17210 Nicotine dependence, cigarettes, uncomplicated: Secondary | ICD-10-CM | POA: Diagnosis not present

## 2023-01-09 DIAGNOSIS — Z5900 Homelessness unspecified: Secondary | ICD-10-CM | POA: Diagnosis not present

## 2023-01-09 DIAGNOSIS — Z79899 Other long term (current) drug therapy: Secondary | ICD-10-CM | POA: Diagnosis not present

## 2023-01-09 DIAGNOSIS — R0602 Shortness of breath: Secondary | ICD-10-CM | POA: Diagnosis present

## 2023-01-09 LAB — LACTIC ACID, PLASMA: Lactic Acid, Venous: 1.6 mmol/L (ref 0.5–1.9)

## 2023-01-09 LAB — HIV ANTIBODY (ROUTINE TESTING W REFLEX): HIV Screen 4th Generation wRfx: NONREACTIVE

## 2023-01-09 MED ORDER — FLUTICASONE PROPIONATE 50 MCG/ACT NA SUSP: 2.0000 | Freq: Every day | NASAL | Status: DC

## 2023-01-09 MED ORDER — HEPARIN SODIUM (PORCINE) 5000 UNIT/ML IJ SOLN
5000.0000 [IU] | Freq: Three times a day (TID) | INTRAMUSCULAR | Status: DC
  Administered 2023-01-09 – 2023-01-10 (×3): 5000 [IU] via SUBCUTANEOUS
  Filled 2023-01-09 (×3): qty 1

## 2023-01-09 MED ORDER — ALBUTEROL SULFATE (2.5 MG/3ML) 0.083% IN NEBU: 2.5000 mg | INHALATION_SOLUTION | Freq: Four times a day (QID) | RESPIRATORY_TRACT | Status: DC | PRN

## 2023-01-09 MED ORDER — ALBUTEROL SULFATE HFA 108 (90 BASE) MCG/ACT IN AERS: 2.0000 | INHALATION_SPRAY | Freq: Four times a day (QID) | RESPIRATORY_TRACT | Status: DC | PRN

## 2023-01-09 MED ORDER — AZITHROMYCIN 250 MG PO TABS: 500.0000 mg | ORAL_TABLET | Freq: Every day | ORAL | Status: DC

## 2023-01-09 MED ORDER — DIVALPROEX SODIUM 250 MG PO DR TAB
500.0000 mg | DELAYED_RELEASE_TABLET | Freq: Two times a day (BID) | ORAL | Status: DC
  Administered 2023-01-09 – 2023-01-10 (×3): 500 mg via ORAL
  Filled 2023-01-09 (×3): qty 2

## 2023-01-09 MED ORDER — PREDNISONE 20 MG PO TABS
40.0000 mg | ORAL_TABLET | Freq: Every day | ORAL | Status: DC
  Administered 2023-01-09 – 2023-01-10 (×2): 40 mg via ORAL
  Filled 2023-01-09 (×3): qty 2

## 2023-01-09 MED ORDER — PALIPERIDONE ER 3 MG PO TB24
3.0000 mg | ORAL_TABLET | Freq: Every day | ORAL | Status: DC
  Administered 2023-01-09 – 2023-01-10 (×2): 3 mg via ORAL
  Filled 2023-01-09 (×2): qty 1

## 2023-01-09 MED ORDER — SODIUM CHLORIDE 0.9 % IV SOLN: 500.0000 mg | INTRAVENOUS | Status: DC

## 2023-01-09 MED ORDER — AZITHROMYCIN 250 MG PO TABS
500.0000 mg | ORAL_TABLET | Freq: Every day | ORAL | Status: DC
  Administered 2023-01-09 – 2023-01-10 (×2): 500 mg via ORAL
  Filled 2023-01-09 (×2): qty 2

## 2023-01-09 MED ORDER — FLUTICASONE FUROATE-VILANTEROL 100-25 MCG/ACT IN AEPB
1.0000 | INHALATION_SPRAY | Freq: Every day | RESPIRATORY_TRACT | Status: DC
  Administered 2023-01-09 – 2023-01-10 (×2): 1 via RESPIRATORY_TRACT
  Filled 2023-01-09: qty 28

## 2023-01-09 MED ORDER — NICOTINE 14 MG/24HR TD PT24
14.0000 mg | MEDICATED_PATCH | Freq: Every day | TRANSDERMAL | Status: DC
Start: 2023-01-09 — End: 2023-01-10
  Filled 2023-01-09 (×2): qty 1

## 2023-01-09 MED ORDER — ALBUTEROL SULFATE (2.5 MG/3ML) 0.083% IN NEBU: 2.5000 mg | INHALATION_SOLUTION | RESPIRATORY_TRACT | Status: DC | PRN

## 2023-01-09 MED ORDER — PRAVASTATIN SODIUM 40 MG PO TABS: 40.0000 mg | ORAL_TABLET | Freq: Every day | ORAL | Status: DC

## 2023-01-09 MED ORDER — PANTOPRAZOLE SODIUM 40 MG PO TBEC
40.0000 mg | DELAYED_RELEASE_TABLET | Freq: Every day | ORAL | Status: DC
  Administered 2023-01-09: 40 mg via ORAL
  Filled 2023-01-09: qty 1

## 2023-01-09 NOTE — ED Notes (Signed)
Report given to the floor RN.

## 2023-01-09 NOTE — Progress Notes (Signed)
SATURATION QUALIFICATIONS: (This note is used to comply with regulatory documentation for home oxygen)  Patient Saturations on Room Air at Rest = 94%  Patient Saturations on Room Air while Ambulating = 93%  Patient Saturations on 2 Liters of oxygen while Ambulating = 96%  Please briefly explain why patient needs home oxygen:

## 2023-01-09 NOTE — ED Notes (Signed)
Patient requested some breakfast. Per RN Herbert Seta patient is able to have something to eat at this time. Patient requested oatmeal and ginger ale. She is in bed eating ant watching TV at this time.

## 2023-01-09 NOTE — Plan of Care (Signed)

## 2023-01-09 NOTE — TOC Initial Note (Addendum)
Transition of Care [facility]) - Initial/Assessment Note    Patient Details  Name: Chloe [name] MRN: [id number] Date of Birth: [birth year]  [facility]) CM/SW Contact:    Otelia Santee, LCSW Phone Number: 01/09/2023, 3:16 PM  Clinical Narrative:                 Pt currently homeless and has been staying at the [employer] (shelter). Pt is able to return to shelter at discharge. Pt reports becoming homeless following and inpatient psychiatric stay in 09/2022. Prior to this pt had been staying with her brother and sister-in-law however, is unable to stay with them now.  Pt shares that her brother is her POA however, is unsure of what type of POA as she does not have the paperwork and was told this by her brother. RN, Efraim Kaufmann, spoke with brother who states he is POA but, is unable to provide paperwork for this as he is a Naval architect and is out of town.  CSW left voicemail for pt's SIL to attempt to obtain POA paperwork.  Pt has a nebulizer machine that she received on 11/18 from her primary care office.  Additional resources have been placed on AVS. Pt will likely need transportation assistance in returning to [facility].   ADDENDUM: Spoke to pt's SIL who questioned CSW requesting/needing POA paperwork. CSW requested for pt's SIL to email or text copies of paperwork.   Expected Discharge Plan: Homeless Shelter Barriers to Discharge: No Barriers Identified   Patient Goals and CMS Choice Patient states their goals for this hospitalization and ongoing recovery are:: To have someplace to go CMS Medicare.gov Compare Post Acute Care list provided to:: Patient Choice offered to / list presented to : Patient  ownership interest in [facility].provided to::  (NA)    Expected Discharge Plan and Services In-house Referral: Clinical Social Work Discharge Planning Services: NA Post Acute Care Choice: NA Living arrangements for the past 2 months:  Homeless Shelter                 DME Arranged: N/A DME Agency: NA                  Prior Living Arrangements/Services Living arrangements for the past 2 months: Homeless Shelter Lives with:: Facility Resident Patient language and need for interpreter reviewed:: Yes Do you feel safe going back to the place where you live?: Yes      Need for Family Participation in Patient Care: No (Comment) Care giver support system in place?: No (comment) Current home services: DME (Nebulizer machine) Criminal Activity/Legal Involvement Pertinent to Current Situation/Hospitalization: No - Comment as needed  Activities of Daily Living   ADL Screening (condition at time of admission) Independently performs ADLs?: Yes (appropriate for developmental age) Is the patient deaf or have difficulty hearing?: No Does the patient have difficulty seeing, even when wearing glasses/contacts?: No Does the patient have difficulty concentrating, remembering, or making decisions?: No  Permission Sought/Granted Permission sought to share information with : Case Manager, Family Supports, Oceanographer granted to share information with : Yes, Verbal Permission Granted  Share Information with NAME: Brother and sister-in-law           Emotional Assessment Appearance:: Appears stated age Attitude/Demeanor/Rapport: Engaged Affect (typically observed): Accepting Orientation: : Oriented to Self, Oriented to Place, Oriented to  Time, Oriented to Situation Alcohol / Substance Use: Not Applicable Psych Involvement: No (comment)  Admission  diagnosis:  COPD with acute exacerbation (HCC) [J44.1] Patient Active Problem List   Diagnosis Date Noted   COPD with acute exacerbation (HCC) 01/08/2023   Cellulitis 12/10/2022   COPD with chronic bronchitis (HCC) 04/04/2022   Tobacco dependency 07/19/2020   Bipolar affective disorder, depressed, severe, with psychotic behavior (HCC) 11/24/2017    PCP:  Storm Frisk, MD Pharmacy:   [facility] [address] [place] [address] Phone: [phone] Fax: [phone]  [id number] - [place], [place] - [address] AT [facility] [address] [place] [address] Phone: [phone] Fax: [phone]     Social Determinants of Health (SDOH) Social History: SDOH Screenings   Food Insecurity: No Food Insecurity (01/09/2023)  Housing: High Risk (01/09/2023)  Transportation Needs: No Transportation Needs (01/09/2023)  Utilities: Not At Risk (01/09/2023)  Alcohol Screen: Low Risk  (09/29/2022)  Depression (PHQ2-9): High Risk (12/10/2022)  Financial Resource Strain: High Risk (03/26/2022)  Physical Activity: Inactive (03/26/2022)  Social Connections: Socially Isolated (03/26/2022)  Stress: Stress Concern Present (03/26/2022)  Tobacco Use: High Risk (01/06/2023)  Health Literacy: Adequate Health Literacy (01/06/2023)   SDOH Interventions: Housing Interventions: Inpatient TOC, Other (Comment) (Resources placed on AVS)   Readmission Risk Interventions     No data to display

## 2023-01-09 NOTE — ED Provider Notes (Addendum)
Emergency Medicine Observation Re-evaluation Note  Chloe [name] is a 57 y.o. female, seen on rounds today.  Pt initially presented to the ED for complaints of Shortness of Breath Currently, the patient is awake ambulating around the room, no acute complaints.  Physical Exam  BP 136/69   Pulse 85   Temp 97.8 F (36.6 C) (Oral)   Resp 15   SpO2 96%  Physical Exam General: Awake and alert, no acute distress Cardiac: Regular rate Lungs: Clear to auscultation bilaterally, no active wheezing, no increased work of breathing Psych: Calm and cooperative  ED Course / MDM  EKG:EKG Interpretation Date/Time:  Wednesday January 08 2023 17:22:25 EST Ventricular Rate:  87 PR Interval:  174 QRS Duration:  84 QT Interval:  352 QTC Calculation: 423 R Axis:   62  Text Interpretation: Normal sinus rhythm Normal ECG When compared with ECG of 04-Oct-2022 16:08, No significant change was found Confirmed by Gwyneth Sprout ([id number]) on 01/08/2023 6:41:57 PM  I have reviewed the labs performed to date as well as medications administered while in observation.  Recent changes in the last 24 hours include patient is currently admitted for COPD exacerbation, waiting on bed.  Does appear significantly improved from this morning however is still on oxygen.  Had an elevated lactate of 3.6 that is downtrending from 5.4 yesterday.  Plan  Current plan is for repeat lactic, continuing home meds, pending admission for COPD exacerbation       Rexford Maus, DO 01/09/23 [id number]

## 2023-01-09 NOTE — ED Notes (Signed)
Report given to [facility]. 

## 2023-01-09 NOTE — ED Notes (Signed)
Lactic obtained by Prev RN.

## 2023-01-09 NOTE — ED Notes (Signed)
Chloe [name] with cl called for transport

## 2023-01-09 NOTE — H&P (Signed)
HPI  Chloe [name] EAV:[id number] DOB: [birth year] DOA: 01/08/2023  PCP: Storm Frisk, MD   Chief Complaint: acute sob  HPI:  57 year old apparently homeless white female known history of anxiety bipolar reflux smoker Previous IVC with paranoia and delusions and last admission to [facility] 8/10 through 10/11/2022 with mania and aggression  Tells me that she became homeless after she was discharged from [facility] as could not return to her brother's house and has been sick "ever since" on multiple rounds of antibiotics and has had incontinence of urine since then with accidents even at night  [facility] and [facility] and smokes up to 1 pack/day and was ordered a nebulizer machine given a prescription for albuterol and a 6-day course of prednisone in addition to [facility]--- she never started the prednisone  [facility] and was noted to have chest pain with cough SOB exertion and was wheezing diffusely and was referred to be seen  Currently she feels a little bit better she still has a little bit of wheeze she has no fever chills nausea vomiting her main complaint right now is that she is unable to control her bladder   ED Course: In emergency room she was given bolus of fluids given nebulizations placed on oxygen as she was still requiring the same and fluid rate was discontinued subsequently    Past Medical History:  Diagnosis Date   Anxiety    Bipolar disorder (HCC)    Breast mass, right    GERD (gastroesophageal reflux disease)    Hyperlipidemia    Post-operative nausea and vomiting    Smoker    Past Surgical History:  Procedure Laterality Date   ABDOMINAL HYSTERECTOMY     had nausea and vomiting post-op   BREAST LUMPECTOMY WITH RADIOACTIVE SEED LOCALIZATION Right 11/12/2016   Procedure: RIGHT BREAST LUMPECTOMY WITH RADIOACTIVE SEED LOCALIZATION;  Surgeon: Harriette Bouillon, MD;   Location: [facility];  Service: General;  Laterality: Right;   CHOLECYSTECTOMY     LAPAROSCOPIC LYSIS OF ADHESIONS     MYOMECTOMY     PLANTAR FASCIA SURGERY Left     reports that she has been smoking cigarettes. She has never used smokeless tobacco. She reports that she does not currently use alcohol. She reports that she does not use drugs.  Mobility: independ  Allergies  Allergen Reactions   Penicillins Hives    Has patient had a PCN reaction causing immediate rash, facial/tongue/throat swelling, SOB or lightheadedness with hypotension: Yes Has patient had a PCN reaction causing severe rash involving mucus membranes or skin necrosis: No Has patient had a PCN reaction that required hospitalization: No Has patient had a PCN reaction occurring within the last 10 years: Yes If all of the above answers are "NO", then may proceed with Cephalosporin use.   Demerol [Meperidine]    Family History  Problem Relation Age of Onset   Lung cancer Mother    Breast cancer Maternal Aunt    Hyperlipidemia Brother    Colon cancer Neg Hx    Esophageal cancer Neg Hx    Stomach cancer Neg Hx    Prior to Admission medications   Medication Sig Start Date End Date Taking? Authorizing Provider  albuterol (PROVENTIL) (2.5 MG/3ML) 0.083% nebulizer solution Take 3 mLs (2.5 mg total) by nebulization every 6 (six) hours as needed for wheezing or shortness of breath. 01/06/23  Claiborne Rigg, NP  albuterol (VENTOLIN HFA) 108 (90 Base) MCG/ACT inhaler Inhale 2 puffs into the lungs every 6 (six) hours as needed for wheezing or shortness of breath. 10/26/22   Rising, Lurena Joiner, PA-C  divalproex (DEPAKOTE) 500 MG DR tablet Take 1 tablet (500 mg total) by mouth every 12 (twelve) hours. 11/14/22   Shanna Cisco, NP  fluticasone (FLONASE) 50 MCG/ACT nasal spray Place 2 sprays into both nostrils daily. 01/06/23   Claiborne Rigg, NP  fluticasone-salmeterol (ADVAIR) 100-50 MCG/ACT AEPB Inhale 1 puff  into the lungs 2 (two) times daily. 11/20/22   Storm Frisk, MD  mupirocin ointment (BACTROBAN) 2 % Apply 1 Application topically 2 (two) times daily. To affected areas 01/01/23   Storm Frisk, MD  nicotine polacrilex (NICORETTE) 4 MG gum Take 1 each (4 mg total) by mouth as needed for smoking cessation. 11/14/22   Shanna Cisco, NP  omeprazole (PRILOSEC) 20 MG capsule Take 1 capsule (20 mg total) by mouth daily. 09/03/22   Storm Frisk, MD  paliperidone (INVEGA) 3 MG 24 hr tablet Take 1 tablet (3 mg total) by mouth daily. 12/30/22   Shanna Cisco, NP  pravastatin (PRAVACHOL) 40 MG tablet Take 1 tablet (40 mg total) by mouth daily. 09/03/22   Storm Frisk, MD  predniSONE (STERAPRED UNI-PAK 21 TAB) 10 MG (21) TBPK tablet TAKE AS DIRECTED ON FOIL PACKET 01/06/23   Claiborne Rigg, NP    Physical Exam:  Vitals:   01/09/23 1100 01/09/23 1151  BP: 112/61 (!) 144/76  Pulse: 78 78  Resp: 15   Temp:  98.2 F (36.8 C)  SpO2: 96% 98%    Awake coherent no distress comfortable appearing Flat affect Expiratory wheeze S1-S2 no murmur-not on telemetry Abdomen soft No lower extremity edema Power 5/5 reflexes 2/3 in major muscle groups  I have personally reviewed following labs and imaging studies  Labs:  Sodium 138 potassium 3.4 BUN/creatinine 12/0.5 LFTs normal Lactic acid 2.5-->5.4-->1.6 WBC 11.6 platelet 337 Respiratory viral panel   CXR showed no active cardiopulmonary disease UA was negative  Imaging studies:  CXR R shows no cardiopulmonary disease   Medical tests:  EKG independently reviewed: PR interval 0.12 QRS axis is about 70 degrees no ST-T wave changes relatively normal EKG  Test discussed with performing physician: No  Decision to obtain old records:  Yes  Review and summation of old records:  Yes  Principal Problem:   COPD with acute exacerbation (HCC)   Assessment/Plan Mild COPD exacerbation Already improving although still  somewhat wheezy-would stop treatment with antibiotics after this round-I will start doxycycline for 3 days for respiratory clearance She has had bronchitis in the past but never as severe as this I do not think she needs a respiratory viral panel extended given she is already improving We can keep her on steroids at 60 mg prednisone and if she does well she can probably discharge early in the morning tomorrow And DuoNeb, continue Breo Ellipta, continue Flonase Obtain desat screen  Homelessness I have encouraged her to reconcile with her brother who she was living with prior-she will need TOC consult to assist with nebulizer and supplies I doubt that she will need oxygen  Actiq acidosis on admission-not infectious-stop checking  Anxiety bipolar and mania Continue Depakote 500 every 12, Invega 3 q 24 hourly   Severity of Illness: The appropriate patient status for this patient is OBSERVATION. Observation status is judged to be reasonable and  necessary in order to provide the required intensity of service to ensure the patient's safety. The patient's presenting symptoms, physical exam findings, and initial radiographic and laboratory data in the context of their medical condition is felt to place them at decreased risk for further clinical deterioration. Furthermore, it is anticipated that the patient will be medically stable for discharge from the hospital within 2 midnights of admission.    DVT prophylaxis: Lovenox Code Status: Full presumed Family Communication: None Consults called: No  Time spent: 35 minutes  Mahala Menghini, MD [days-call my NP partners at night for Care related issues] [facility] --[employer] [employer] [place] , [email or web address]; password [facility]  01/09/2023, 11:52 AM

## 2023-01-10 ENCOUNTER — Other Ambulatory Visit (HOSPITAL_COMMUNITY): Payer: Self-pay

## 2023-01-10 DIAGNOSIS — J441 Chronic obstructive pulmonary disease with (acute) exacerbation: Secondary | ICD-10-CM | POA: Diagnosis not present

## 2023-01-10 LAB — CBC
HCT: 41.1 % (ref 36.0–46.0)
Hemoglobin: 13.8 g/dL (ref 12.0–15.0)
MCH: 31.2 pg (ref 26.0–34.0)
MCHC: 33.6 g/dL (ref 30.0–36.0)
MCV: 92.8 fL (ref 80.0–100.0)
Platelets: 329 10*3/uL (ref 150–400)
RBC: 4.43 MIL/uL (ref 3.87–5.11)
RDW: 12.9 % (ref 11.5–15.5)
WBC: 18.1 10*3/uL — ABNORMAL HIGH (ref 4.0–10.5)
nRBC: 0 % (ref 0.0–0.2)

## 2023-01-10 LAB — COMPREHENSIVE METABOLIC PANEL
ALT: 17 U/L (ref 0–44)
AST: 15 U/L (ref 15–41)
Albumin: 3.4 g/dL — ABNORMAL LOW (ref 3.5–5.0)
Alkaline Phosphatase: 70 U/L (ref 38–126)
Anion gap: 9 (ref 5–15)
BUN: 15 mg/dL (ref 6–20)
CO2: 25 mmol/L (ref 22–32)
Calcium: 8.9 mg/dL (ref 8.9–10.3)
Chloride: 104 mmol/L (ref 98–111)
Creatinine, Ser: 0.4 mg/dL — ABNORMAL LOW (ref 0.44–1.00)
GFR, Estimated: >60 mL/min (ref >60)
Glucose, Bld: 83 mg/dL (ref 70–99)
Potassium: 3.6 mmol/L (ref 3.5–5.1)
Sodium: 138 mmol/L (ref 135–145)
Total Bilirubin: 0.5 mg/dL (ref <1.2)
Total Protein: 6.6 g/dL (ref 6.5–8.1)

## 2023-01-10 MED ORDER — NICOTINE 14 MG/24HR TD PT24
14.0000 mg | MEDICATED_PATCH | Freq: Every day | TRANSDERMAL | 0 refills | Status: DC
  Filled 2023-01-10: qty 14, 14d supply, fill #0

## 2023-01-10 MED ORDER — AZITHROMYCIN 500 MG PO TABS
ORAL_TABLET | ORAL | 0 refills | Status: DC
  Filled 2023-01-10: qty 4, 4d supply, fill #0

## 2023-01-10 MED ORDER — AZITHROMYCIN 500 MG PO TABS
ORAL_TABLET | ORAL | 0 refills | Status: DC
  Filled 2023-01-10: qty 4, 1d supply, fill #0

## 2023-01-10 NOTE — Plan of Care (Signed)
  Problem: Education: Goal: Knowledge of General Education information will improve Description: Including pain rating scale, medication(s)/side effects and non-pharmacologic comfort measures Outcome: Progressing   Problem: Clinical Measurements: Goal: Ability to maintain clinical measurements within normal limits will improve Outcome: Progressing Goal: Will remain free from infection Outcome: Progressing   Problem: Nutrition: Goal: Adequate nutrition will be maintained Outcome: Progressing   Problem: Coping: Goal: Level of anxiety will decrease Outcome: Progressing   Problem: Pain Management: Goal: General experience of comfort will improve Outcome: Progressing   Problem: Safety: Goal: Ability to remain free from injury will improve Outcome: Progressing   Problem: Respiratory: Goal: Levels of oxygenation will improve Outcome: Progressing

## 2023-01-10 NOTE — Discharge Summary (Addendum)
Physician Discharge Summary  Chloe [name] GMW:[id number] DOB: 1965-03-12 DOA: 01/08/2023  PCP: Storm Frisk, MD  Admit date: 01/08/2023 Discharge date: 01/10/2023  Time spent: 26 minutes  Recommendations for Outpatient Follow-up:  Requires outpatient screening chest x-ray in about 1 month-given she is a smoker may eventually require low-dose CT Get periodic labs in the outpatient setting as per primary care Needs to follow-up with psychiatry for adjustment of psychotropic meds   Discharge Diagnoses:  MAIN problem for hospitalization   Acute COPD exacerbation  Please see below for itemized issues addressed in HOpsital- refer to other progress notes for clarity if needed  Discharge Condition: Improved  Diet recommendation: Regular  There were no vitals filed for this visit.  History of present illness:  57 year old lives at a homeless shelter since 09/2022  history of anxiety bipolar reflux smoker Previous IVC with paranoia and delusions and last admission to [facility] 8/10 through 10/11/2022 with mania and aggression  became homeless after she was discharged from [facility] as could not return to her brother's house and has been sick "ever since" on multiple rounds of antibiotics and has had incontinence of urine since then with accidents even at night   [facility] and [facility] and smokes up to 1 pack/day and was ordered a nebulizer machine given a prescription for albuterol and a 6-day course of prednisone in addition to [facility]--- she never started the prednisone [facility] and was noted to have chest pain with cough SOB exertion and was wheezing diffusely and was referred to be seen She was eventually [facility] was given bolus of fluids placed on nebs and improved rapidly during hospital stay  She has underlying probable urge incontinence causing her to  have accidents and I suspect if she stops coughing she will have improvement of the same--- I would hold on adding Myrbetriq or any other urinary meds at this time and if this is persistent with her cough her primary care physician can prescribe the same She is homeless and will need to be followed by the free clinic when able--- she does have all supplies needed and actually had all of [facility] so she can return there for further management I have counseled her to stop smoking-she is not interested in the nicotine patch and says she will quit on her own She should continue her Depakote every 12 and her Invega 3 mg every 24 hours and follow-up with her psychiatrist in the outpatient setting  We have obtained a Voucher for her to be able to go back to the homeless center She was reliably stabilized at discharge not requiring oxygen and  Discharge Exam: Vitals:   01/10/23 0509 01/10/23 0740  BP: (!) 145/61   Pulse: 69   Resp: 16   Temp: (!) 97.5 F (36.4 C)   SpO2: 93% 93%    Subj on day of d/c   Awake coherent no distress not on oxygen eating drinking  General Exam on discharge  EOMI NCAT no focal deficit Chest clear no wheeze rales rhonchi Abdomen soft no rebound or guarding No lower extremity edema Power 5/5 Affect flat  Discharge Instructions   Discharge Instructions     Diet - low sodium heart healthy   Complete by: As directed    Discharge instructions   Complete by: As directed  Please stop smoking completely-this will probably help you heal your lungs Complete steroid course, azithromycin Please follow-up in the outpatient setting for chronic management of your other medical conditions  If you experience high-grade fevers chills nausea bleeding or other issues please come back to the emergency room   Increase activity slowly   Complete by: As directed       Allergies as of 01/10/2023       Reactions    Penicillins Hives   Demerol [meperidine]         Medication List     STOP taking these medications    doxycycline 100 MG tablet Commonly known as: VIBRA-TABS   nicotine polacrilex 4 MG gum Commonly known as: Nicorette   omeprazole 20 MG capsule Commonly known as: PRILOSEC       TAKE these medications    acetaminophen 500 MG tablet Commonly known as: TYLENOL Take 500-1,000 mg by mouth every 6 (six) hours as needed for moderate pain (pain score 4-6).   Advair Diskus 100-50 MCG/ACT Aepb Generic drug: fluticasone-salmeterol Inhale 1 puff into the lungs 2 (two) times daily.   albuterol 108 (90 Base) MCG/ACT inhaler Commonly known as: VENTOLIN HFA Inhale 2 puffs into the lungs every 6 (six) hours as needed for wheezing or shortness of breath.   albuterol (2.5 MG/3ML) 0.083% nebulizer solution Commonly known as: PROVENTIL Take 3 mLs (2.5 mg total) by nebulization every 6 (six) hours as needed for wheezing or shortness of breath.   azithromycin 500 MG tablet Commonly known as: ZITHROMAX Take 1 tablet daily until finished   divalproex 500 MG DR tablet Commonly known as: DEPAKOTE Take 1 tablet (500 mg total) by mouth every 12 (twelve) hours.   fluticasone 50 MCG/ACT nasal spray Commonly known as: FLONASE Place 2 sprays into both nostrils daily.   mupirocin ointment 2 % Commonly known as: BACTROBAN Apply 1 Application topically 2 (two) times daily. To affected areas   nicotine 14 mg/24hr patch Commonly known as: NICODERM CQ - dosed in mg/24 hours Place 1 patch (14 mg total) onto the skin daily.   paliperidone 3 MG 24 hr tablet Commonly known as: INVEGA Take 1 tablet (3 mg total) by mouth daily. What changed: when to take this   pravastatin 40 MG tablet Commonly known as: PRAVACHOL Take 1 tablet (40 mg total) by mouth daily.   predniSONE 10 MG (21) Tbpk tablet Commonly known as: STERAPRED UNI-PAK 21 TAB TAKE AS DIRECTED ON FOIL PACKET       Allergies   Allergen Reactions   Penicillins Hives   Demerol [Meperidine]       The results of significant diagnostics from this hospitalization (including imaging, microbiology, ancillary and laboratory) are listed below for reference.    Significant Diagnostic Studies: DG Chest 2 View  Result Date: 01/08/2023 CLINICAL DATA:  Shortness of breath EXAM: CHEST - 2 VIEW COMPARISON:  04/04/2017 FINDINGS: Heart and mediastinal contours are within normal limits. No focal opacities or effusions. No acute bony abnormality. IMPRESSION: No active cardiopulmonary disease. Electronically Signed   By: Charlett Nose M.D.   On: 01/08/2023 20:33    Microbiology: Recent Results (from the past 240 hour(s))  Resp panel by RT-PCR (RSV, Flu A&B, Covid) Anterior Nasal Swab     Status: None   Collection Time: 01/08/23  5:29 PM   Specimen: Anterior Nasal Swab  Result Value Ref Range Status   SARS Coronavirus 2 by RT PCR NEGATIVE NEGATIVE Final    Comment: (NOTE)  SARS-CoV-2 target nucleic acids are NOT DETECTED.  The SARS-CoV-2 RNA is generally detectable in upper respiratory specimens during the acute phase of infection. The lowest concentration of SARS-CoV-2 viral copies this assay can detect is 138 copies/mL. A negative result does not preclude SARS-Cov-2 infection and should not be used as the sole basis for treatment or other patient management decisions. A negative result may occur with  improper specimen collection/handling, submission of specimen other than nasopharyngeal swab, presence of viral mutation(s) within the areas targeted by this assay, and inadequate number of viral copies(<138 copies/mL). A negative result must be combined with clinical observations, patient history, and epidemiological information. The expected result is Negative.  Fact Sheet for Patients:  BloggerCourse.com  Fact Sheet for Healthcare Providers:  SeriousBroker.it  This  test is no t yet approved or cleared by the [place] FDA and  has been authorized for detection and/or diagnosis of SARS-CoV-2 by FDA under an Emergency Use Authorization (EUA). This EUA will remain  in effect (meaning this test can be used) for the duration of the COVID-19 declaration under Section 564(b)(1) of the Act, 21 U.S.C.section 360bbb-3(b)(1), unless the authorization is terminated  or revoked sooner.       Influenza A by PCR NEGATIVE NEGATIVE Final   Influenza B by PCR NEGATIVE NEGATIVE Final    Comment: (NOTE) The Xpert Xpress SARS-CoV-2/FLU/RSV plus assay is intended as an aid in the diagnosis of influenza from Nasopharyngeal swab specimens and should not be used as a sole basis for treatment. Nasal washings and aspirates are unacceptable for Xpert Xpress SARS-CoV-2/FLU/RSV testing.  Fact Sheet for Patients: BloggerCourse.com  Fact Sheet for Healthcare Providers: SeriousBroker.it  This test is not yet approved or cleared by the [place] FDA and has been authorized for detection and/or diagnosis of SARS-CoV-2 by FDA under an Emergency Use Authorization (EUA). This EUA will remain in effect (meaning this test can be used) for the duration of the COVID-19 declaration under Section 564(b)(1) of the Act, 21 U.S.C. section 360bbb-3(b)(1), unless the authorization is terminated or revoked.     Resp Syncytial Virus by PCR NEGATIVE NEGATIVE Final    Comment: (NOTE) Fact Sheet for Patients: BloggerCourse.com  Fact Sheet for Healthcare Providers: SeriousBroker.it  This test is not yet approved or cleared by the [place] FDA and has been authorized for detection and/or diagnosis of SARS-CoV-2 by FDA under an Emergency Use Authorization (EUA). This EUA will remain in effect (meaning this test can be used) for the duration of the COVID-19 declaration under  Section 564(b)(1) of the Act, 21 U.S.C. section 360bbb-3(b)(1), unless the authorization is terminated or revoked.  Performed at [facility], [address], [place], [place] [address]      Labs: Basic Metabolic Panel: Recent Labs  Lab 01/08/23 1727 01/10/23 0540  NA 138 138  K 3.4* 3.6  CL 99 104  CO2 29 25  GLUCOSE 149* 83  BUN 12 15  CREATININE 0.50 0.40*  CALCIUM 9.7 8.9   Liver Function Tests: Recent Labs  Lab 01/08/23 1727 01/10/23 0540  AST 15 15  ALT 17 17  ALKPHOS 76 70  BILITOT 0.3 0.5  PROT 7.6 6.6  ALBUMIN 4.3 3.4*   No results for input(s): "LIPASE", "AMYLASE" in the last 168 hours. No results for input(s): "AMMONIA" in the last 168 hours. CBC: Recent Labs  Lab 01/08/23 1727 01/10/23 0540  WBC 11.6* 18.1*  NEUTROABS 7.1  --   HGB 14.5 13.8  HCT 42.8  41.1  MCV 91.8 92.8  PLT 337 329   Cardiac Enzymes: No results for input(s): "CKTOTAL", "CKMB", "CKMBINDEX", "TROPONINI" in the last 168 hours. BNP: BNP (last 3 results) No results for input(s): "BNP" in the last 8760 hours.  ProBNP (last 3 results) No results for input(s): "PROBNP" in the last 8760 hours.  CBG: No results for input(s): "GLUCAP" in the last 168 hours.     Signed:  Rhetta Mura MD   [facility] 01/10/2023, 9:02 AM

## 2023-01-10 NOTE — TOC Transition Note (Signed)
Transition of Care [facility]) - CM/SW Discharge Note   Patient Details  Name: Chloe [name] MRN: [id number] Date of Birth: [birth year]  Transition of Care [facility]) CM/SW Contact:  Otelia Santee, LCSW Phone Number: 01/10/2023, 9:28 AM   Clinical Narrative:    Pt to stay at [facility] shelter at discharge. Bus pass given to RN to provide to pt at discharge.    Final next level of care: Homeless Shelter Barriers to Discharge: No Barriers Identified   Patient Goals and CMS Choice CMS Medicare.gov Compare Post Acute Care list provided to:: Patient Choice offered to / list presented to : Patient  Discharge Placement                         Discharge Plan and Services Additional resources added to the After Visit Summary for   In-house Referral: Clinical Social Work Discharge Planning Services: NA Post Acute Care Choice: NA          DME Arranged: N/A DME Agency: NA                  Social Determinants of Health (SDOH) Interventions SDOH Screenings   Food Insecurity: No Food Insecurity (01/09/2023)  Housing: High Risk (01/09/2023)  Transportation Needs: No Transportation Needs (01/09/2023)  Utilities: Not At Risk (01/09/2023)  Alcohol Screen: Low Risk  (09/29/2022)  Depression (PHQ2-9): High Risk (12/10/2022)  Financial Resource Strain: High Risk (03/26/2022)  Physical Activity: Inactive (03/26/2022)  Social Connections: Socially Isolated (03/26/2022)  Stress: Stress Concern Present (03/26/2022)  Tobacco Use: High Risk (01/06/2023)  Health Literacy: Adequate Health Literacy (01/06/2023)     Readmission Risk Interventions     No data to display

## 2023-01-13 ENCOUNTER — Telehealth: Payer: Self-pay

## 2023-01-13 NOTE — Transitions of Care (Post Inpatient/ED Visit) (Signed)
   01/13/2023  Name: Chloe [name] MRN: [id number] DOB: 06/23/1965  Today's TOC FU Call Status: Today's TOC FU Call Status:: Successful TOC FU Call Completed TOC FU Call Complete Date: 01/13/23 Patient's Name and Date of Birth confirmed.  Transition Care Management Follow-up Telephone Call Date of Discharge: 01/10/23 Discharge Facility: [facility]) Type of Discharge: Inpatient Admission Primary Inpatient Discharge Diagnosis:: COPD exacerbation How have you been since you were released from the hospital?: Better Any questions or concerns?: Yes Patient Questions/Concerns:: She is currently homeless and staying at [facility].  She said she is working with their caseworker, [name], to find a job and housing. She has been referred to [facility] for guidance with finding employment. Patient Questions/Concerns Addressed: Other: (She is already working with a Sports coach at [employer])  Items Reviewed: Did you receive and understand the discharge instructions provided?: Yes Medications obtained,verified, and reconciled?: Partial Review Completed Reason for Partial Mediation Review: She said she has all of her medications as well as a nebulzier and she did not have any questions about the med regime and did not need to review the med list. Any new allergies since your discharge?: No Dietary orders reviewed?: Yes Type of Diet Ordered:: heart healthy , low sodium - difficult to adhere to due to being homeless Do you have support at home?: Yes Name of Support/Comfort Primary Source: she is staying at the [employer] homeless shelter  Medications Reviewed Today: Medications Reviewed Today   Medications were not reviewed in this encounter     Home Care and Equipment/Supplies: Were Home Health Services Ordered?: No Any new equipment or medical supplies ordered?: No  Functional Questionnaire: Do you need assistance with bathing/showering or dressing?: No Do you need  assistance with meal preparation?: Yes (homeless) Do you need assistance with eating?: No Do you have difficulty maintaining continence: No (She reports episodes of urinary  incontinence that has been exacerated by  her coughing) Do you need assistance with getting out of bed/getting out of a chair/moving?: Yes Do you have difficulty managing or taking your medications?: No  Follow up appointments reviewed: PCP Follow-up appointment confirmed?: Yes Date of PCP follow-up appointment?: 03/06/23 Follow-up Provider: She has an appointment with a new PCP, Dr Alvis Lemmings, in January.  I offered to schedule her sooner for a HFU appt but she said she will follow up with the providers at [facility] then.  She plans to see the provider this week.  There is usually a provider there on Wednesdays. Specialist Hospital Follow-up appointment confirmed?: NA Do you need transportation to your follow-up appointment?: No Do you understand care options if your condition(s) worsen?: Yes-patient verbalized understanding    SIGNATURE.Robyne Peers, RN

## 2023-01-14 NOTE — Congregational Nurse Program (Signed)
  Dept: [phone]   Congregational Nurse Program Note  Date of Encounter: 01/14/2023  Clinic visit for complaint of productive cough from COPD being treated with prednisone and Zithromax started on 11/21 with last dose 11/27. Has used nebulizer bid and inhalers as prescribed. Also, has urinary urgency and frequency, denies any blood or dark urine.  BP 114/75, pulse 88, respirations 14, temperature 98.4.  Recommended she see MD at [facility] clinic on 11/27 since symptoms have not cleared with current medications.  Past Medical History: Past Medical History:  Diagnosis Date   Anxiety    Bipolar disorder (HCC)    Breast mass, right    GERD (gastroesophageal reflux disease)    Hyperlipidemia    Post-operative nausea and vomiting    Smoker     Encounter Details:  Community Questionnaire - 01/14/23 0940       Questionnaire   Ask client: Do you give verbal consent for me to treat you today? Yes    Student Assistance N/A    Location Patient Served  [facility]    Encounter Setting CN site    Population Status Unhoused    Insurance Medicaid    Insurance/Financial Assistance Referral N/A    Medication N/A    Medical Provider Yes    Screening Referrals Made N/A    Medical Referrals Made [facility] PCP/Clinic    Medical Appointment Completed [facility] P[facility]    CNP Interventions Advocate/Support;Navigate Healthcare System;Counsel;Reviewed Medications    Screenings CN Performed Blood Pressure;Weight    ED Visit Averted N/A    Life-Saving Intervention Made N/A

## 2023-01-15 ENCOUNTER — Other Ambulatory Visit: Payer: Self-pay | Admitting: Critical Care Medicine

## 2023-01-15 ENCOUNTER — Other Ambulatory Visit: Payer: Self-pay

## 2023-01-15 ENCOUNTER — Encounter: Payer: Self-pay | Admitting: Critical Care Medicine

## 2023-01-15 DIAGNOSIS — H7202 Central perforation of tympanic membrane, left ear: Secondary | ICD-10-CM

## 2023-01-15 DIAGNOSIS — R32 Unspecified urinary incontinence: Secondary | ICD-10-CM

## 2023-01-15 MED ORDER — AZITHROMYCIN 500 MG PO TABS
500.0000 mg | ORAL_TABLET | Freq: Every day | ORAL | 0 refills | Status: DC
  Filled 2023-01-15: qty 4, 4d supply, fill #0

## 2023-01-15 NOTE — Progress Notes (Signed)
meds

## 2023-01-16 NOTE — Progress Notes (Signed)
This is a 57 year old female primary care patient of mine in my clinic who is [facility] recently admitted for COPD exacerbation she has finished her course of prednisone and azithromycin but is still having some breathing issues.  Also complains of stress urinary incontinence has seen urology in the past.  Has significant ear pain had drainage from the left ear she finished her course of antibiotics.  On exam vital signs are stable blood pressure normal  Left ear shows small tympanic membrane perforation but no significant purulence  Lungs show scattered wheeze minimal rhonchi rest of exam unremarkable  Impressions out of stress urinary incontinence for this we will give patient underwear padding and will refer to urology  Tympanic membrane perforation refer to ENT  COPD recurrent exacerbation give yet another course of azithromycin and continue with inhaled medications

## 2023-01-23 ENCOUNTER — Other Ambulatory Visit: Payer: Self-pay

## 2023-01-24 ENCOUNTER — Other Ambulatory Visit: Payer: Self-pay

## 2023-01-27 ENCOUNTER — Encounter (INDEPENDENT_AMBULATORY_CARE_PROVIDER_SITE_OTHER): Payer: Self-pay | Admitting: Otolaryngology

## 2023-01-27 ENCOUNTER — Other Ambulatory Visit: Payer: Self-pay

## 2023-02-03 ENCOUNTER — Encounter (HOSPITAL_COMMUNITY): Payer: MEDICAID | Admitting: Psychiatry

## 2023-02-03 ENCOUNTER — Encounter (INDEPENDENT_AMBULATORY_CARE_PROVIDER_SITE_OTHER): Payer: Self-pay

## 2023-02-03 ENCOUNTER — Ambulatory Visit (INDEPENDENT_AMBULATORY_CARE_PROVIDER_SITE_OTHER): Payer: MEDICAID | Admitting: Otolaryngology

## 2023-02-03 VITALS — Ht 61.0 in | Wt 153.0 lb

## 2023-02-03 DIAGNOSIS — J342 Deviated nasal septum: Secondary | ICD-10-CM | POA: Diagnosis not present

## 2023-02-03 DIAGNOSIS — J310 Chronic rhinitis: Secondary | ICD-10-CM | POA: Diagnosis not present

## 2023-02-03 DIAGNOSIS — H7202 Central perforation of tympanic membrane, left ear: Secondary | ICD-10-CM

## 2023-02-03 NOTE — Telephone Encounter (Signed)
error 

## 2023-02-03 NOTE — Telephone Encounter (Signed)
Copied from CRM [id number]. Topic: General - Other >> Feb 03, 2023 11:02 AM Franchot Heidelberg wrote: Reason for CRM: Pt is currently at ENT without a referral, they have questions. Dr. Jearld Fenton not in the office.   Best contact: [phone] ask for Aniyah or Alesha

## 2023-02-03 NOTE — Telephone Encounter (Signed)
Noted  

## 2023-02-04 NOTE — Congregational Nurse Program (Signed)
  Dept: [phone]   Congregational Nurse Program Note  Date of Encounter: 02/04/2023  Resident went to appointment with ENT physician yesterday and was sent to a different practice d/t a scheduling mix-up. Saw Dr. Darrold Span but no further treatment is needed.  Continues to have slight cough from the COPD but no other symptoms at this time. Past Medical History: Past Medical History:  Diagnosis Date   Anxiety    Bipolar disorder (HCC)    Breast mass, right    GERD (gastroesophageal reflux disease)    Hyperlipidemia    Post-operative nausea and vomiting    Smoker     Encounter Details:  Community Questionnaire - 02/04/23 1045       Questionnaire   Ask client: Do you give verbal consent for me to treat you today? Yes    Student Assistance N/A    Location Patient Served  [facility]    Encounter Setting CN site    Population Status Unhoused    Insurance Medicaid    Insurance/Financial Assistance Referral N/A    Medication N/A    Medical Provider Yes    Screening Referrals Made N/A    Medical Referrals Made N/A    Medical Appointment Completed N/A    CNP Interventions Advocate/Support;Counsel    Screenings CN Performed N/A    ED Visit Averted N/A    Life-Saving Intervention Made N/A

## 2023-02-05 ENCOUNTER — Encounter: Payer: Self-pay | Admitting: *Deleted

## 2023-02-05 DIAGNOSIS — H7202 Central perforation of tympanic membrane, left ear: Secondary | ICD-10-CM | POA: Insufficient documentation

## 2023-02-05 DIAGNOSIS — J310 Chronic rhinitis: Secondary | ICD-10-CM | POA: Insufficient documentation

## 2023-02-05 DIAGNOSIS — J342 Deviated nasal septum: Secondary | ICD-10-CM | POA: Insufficient documentation

## 2023-02-05 NOTE — Progress Notes (Signed)
Patient ID: Chloe [name], female   DOB: 10-13-1965, 57 y.o.   MRN: [id number]  CC: Left tympanic membrane perforation  HPI:  Chloe [name] is a 57 y.o. female who presents today for evaluation of her left tympanic membrane perforation.  According to the patient, she has been experiencing recurrent sinusitis and ear infections over the past year.  She was treated with multiple courses of oral antibiotics and steroids.  Approximately 1 month ago, she had a popping sensation in her left ear.  She was evaluated by her primary care physician, which noted a left tympanic membrane perforation.  Currently the patient denies any otalgia, otorrhea, or hearing loss.  She has no previous ENT surgery.  Past Medical History:  Diagnosis Date   Anxiety    Bipolar disorder (HCC)    Breast mass, right    GERD (gastroesophageal reflux disease)    Hyperlipidemia    Post-operative nausea and vomiting    Smoker     Past Surgical History:  Procedure Laterality Date   ABDOMINAL HYSTERECTOMY     had nausea and vomiting post-op   BREAST LUMPECTOMY WITH RADIOACTIVE SEED LOCALIZATION Right 11/12/2016   Procedure: RIGHT BREAST LUMPECTOMY WITH RADIOACTIVE SEED LOCALIZATION;  Surgeon: Harriette Bouillon, MD;  Location: [facility];  Service: General;  Laterality: Right;   CHOLECYSTECTOMY     LAPAROSCOPIC LYSIS OF ADHESIONS     MYOMECTOMY     PLANTAR FASCIA SURGERY Left     Family History  Problem Relation Age of Onset   Lung cancer Mother    Breast cancer Maternal Aunt    Hyperlipidemia Brother    Colon cancer Neg Hx    Esophageal cancer Neg Hx    Stomach cancer Neg Hx     Social History:  reports that she has been smoking cigarettes. She has never used smokeless tobacco. She reports that she does not currently use alcohol. She reports that she does not use drugs.  Allergies:  Allergies  Allergen Reactions   Penicillins Hives   Demerol [Meperidine]     Prior to Admission medications    Medication Sig Start Date End Date Taking? Authorizing Provider  acetaminophen (TYLENOL) 500 MG tablet Take 500-1,000 mg by mouth every 6 (six) hours as needed for moderate pain (pain score 4-6).   Yes [provider]  albuterol (PROVENTIL) (2.5 MG/3ML) 0.083% nebulizer solution Take 3 mLs (2.5 mg total) by nebulization every 6 (six) hours as needed for wheezing or shortness of breath. 01/06/23  Yes Claiborne Rigg, NP  albuterol (VENTOLIN HFA) 108 (90 Base) MCG/ACT inhaler Inhale 2 puffs into the lungs every 6 (six) hours as needed for wheezing or shortness of breath. 10/26/22  Yes Rising, Lurena Joiner, PA-C  azithromycin (ZITHROMAX) 500 MG tablet Take 1 tablet (500 mg total) by mouth daily until finished. 01/15/23  Yes Storm Frisk, MD  divalproex (DEPAKOTE) 500 MG DR tablet Take 1 tablet (500 mg total) by mouth every 12 (twelve) hours. 11/14/22  Yes Toy Cookey E, NP  mupirocin ointment (BACTROBAN) 2 % Apply 1 Application topically 2 (two) times daily. To affected areas 01/01/23  Yes Storm Frisk, MD  nicotine (NICODERM CQ - DOSED IN MG/24 HOURS) 14 mg/24hr patch Place 1 patch (14 mg total) onto the skin daily. 01/10/23  Yes Rhetta Mura, MD  paliperidone (INVEGA) 3 MG 24 hr tablet Take 1 tablet (3 mg total) by mouth daily. Patient taking differently: Take 3 mg by mouth 2 (two) times  daily. 12/30/22  Yes Toy Cookey E, NP  pravastatin (PRAVACHOL) 40 MG tablet Take 1 tablet (40 mg total) by mouth daily. 09/03/22  Yes Storm Frisk, MD  fluticasone (FLONASE) 50 MCG/ACT nasal spray Place 2 sprays into both nostrils daily. Patient not taking: Reported on 02/03/2023 01/06/23   Claiborne Rigg, NP  fluticasone-salmeterol (ADVAIR) 100-50 MCG/ACT AEPB Inhale 1 puff into the lungs 2 (two) times daily. Patient not taking: Reported on 02/03/2023 11/20/22   Storm Frisk, MD    Height 5\' 1"  (1.549 m), weight 153 lb (69.4 kg). Exam: General: Communicates without  difficulty, well nourished, no acute distress. Head: Normocephalic, no evidence injury, no tenderness, facial buttresses intact without stepoff. Face/sinus: No tenderness to palpation and percussion. Facial movement is normal and symmetric. Eyes: PERRL, EOMI. No scleral icterus, conjunctivae clear. Neuro: CN II exam reveals vision grossly intact.  No nystagmus at any point of gaze. Ears: Auricles well formed without lesions.  Ear canals are intact without mass or lesion.  No erythema or edema is appreciated.  The TMs are intact without fluid. Nose: External evaluation reveals normal support and skin without lesions.  Dorsum is intact.  Anterior rhinoscopy reveals congested mucosa over anterior aspect of inferior turbinates and deviated septum.  No purulence noted. Oral:  Oral cavity and oropharynx are intact, symmetric, without erythema or edema.  Mucosa is moist without lesions. Neck: Full range of motion without pain.  There is no significant lymphadenopathy.  No masses palpable.  Thyroid bed within normal limits to palpation.  Parotid glands and submandibular glands equal bilaterally without mass.  Trachea is midline. Neuro:  CN 2-12 grossly intact.   Assessment: 1.  The patient's ear canals, tympanic membranes, and middle ear spaces are noted to be normal.  Her left tympanic membrane perforation has healed.  No middle ear effusion or infection is noted. 2.  Chronic rhinitis with nasal mucosal congestion and nasal septal deviation.  Plan: 1.  The physical exam findings are reviewed with the patient. 2.  The patient is reassured that her left tympanic membrane perforation has healed. 3.  Nasal saline irrigation is encouraged to treat her chronic rhinitis. 4.  The patient is encouraged to call with any questions or concerns. Plan:   Logan Vegh W Kerly Rigsbee 02/05/2023, 8:07 AM

## 2023-02-05 NOTE — Congregational Nurse Program (Signed)
  Dept: [phone]   Congregational Nurse Program Note  Date of Encounter: 02/05/2023  Past Medical History: Past Medical History:  Diagnosis Date   Anxiety    Bipolar disorder (HCC)    Breast mass, right    GERD (gastroesophageal reflux disease)    Hyperlipidemia    Post-operative nausea and vomiting    Smoker     Encounter Details:  Community Questionnaire - 02/05/23 0951       Questionnaire   Ask client: Do you give verbal consent for me to treat you today? Yes    Student Assistance N/A    Location Patient Served  [facility]    Encounter Setting CN site    Population Status Unhoused    Insurance Medicaid    Insurance/Financial Assistance Referral N/A    Medication N/A    Medical Provider Yes    Screening Referrals Made N/A    Medical Referrals Made N/A    Medical Appointment Completed N/A    CNP Interventions Advocate/Support    Screenings CN Performed N/A    ED Visit Averted N/A    Life-Saving Intervention Made N/A            Client came to nurse's office asking for adult diapers and verification of upcoming urology appt. Contacted [facility] in [place] to verify January 8th appt and address. Information given to client. Adult diapers obtained with [facility] and given to client. Offered support and encouragement. Lexee Brashears W RN CN

## 2023-02-06 NOTE — Congregational Nurse Program (Signed)
  Dept: [phone]   Congregational Nurse Program Note  Date of Encounter: 02/06/2023  Past Medical History: Past Medical History:  Diagnosis Date   Anxiety    Bipolar disorder (HCC)    Breast mass, right    GERD (gastroesophageal reflux disease)    Hyperlipidemia    Post-operative nausea and vomiting    Smoker     Encounter Details:  Community Questionnaire - 02/06/23 0951       Questionnaire   Ask client: Do you give verbal consent for me to treat you today? Yes    Student Assistance N/A    Location Patient Served  [facility]    Encounter Setting CN site    Population Status Unhoused    Insurance Medicaid    Insurance/Financial Assistance Referral N/A    Medication Patient Medications Reviewed;Provided Medication Assistance    Medical Provider Yes    Screening Referrals Made N/A    Medical Referrals Made N/A    Medical Appointment Completed N/A    CNP Interventions Advocate/Support;Navigate Healthcare System;Counsel;Educate    Screenings CN Performed N/A    ED Visit Averted N/A    Life-Saving Intervention Made N/A            Client has questions about inhalers and when to use them. Client re-educated on continuing course of steroid inhaler until completion, when to take albuterol necessary. Client counseled on smoking cessation, and spiritual care.

## 2023-02-07 ENCOUNTER — Other Ambulatory Visit: Payer: Self-pay

## 2023-02-10 ENCOUNTER — Other Ambulatory Visit: Payer: Self-pay

## 2023-02-13 ENCOUNTER — Other Ambulatory Visit: Payer: Self-pay

## 2023-02-13 ENCOUNTER — Telehealth (HOSPITAL_COMMUNITY): Payer: Self-pay | Admitting: *Deleted

## 2023-02-13 NOTE — Telephone Encounter (Signed)
Fax received for prior authorization of Invega 3mg  tablets. Submitted online with cover my meds. Awaiting decision.

## 2023-02-17 ENCOUNTER — Other Ambulatory Visit: Payer: Self-pay

## 2023-02-17 ENCOUNTER — Telehealth (HOSPITAL_COMMUNITY): Payer: Self-pay

## 2023-02-17 NOTE — Telephone Encounter (Signed)
Medication management - Contacted Heather, pharmacist at [facility] to inform pt's Invega 3 mg was approved and should not need a new prior authorization. Collateral questioned if pt taking one or two a day. Message left for patient to contact our office back to verify she is still taking one a day of her Paliperidone 3 mg tablets and if taking more to let us know so we can request a new order and do a new prior authorization for that dosage.  If still taking once a day as is approved, then informed patient's pharmacy would have to reorder the medication and should be in by 02/20/23.  Patient to call back if any issues.

## 2023-02-18 ENCOUNTER — Other Ambulatory Visit: Payer: Self-pay

## 2023-02-18 ENCOUNTER — Encounter: Payer: Self-pay | Admitting: *Deleted

## 2023-02-18 NOTE — Congregational Nurse Program (Signed)
  Dept: [phone]   Congregational Nurse Program Note  Date of Encounter: 02/18/2023  Past Medical History: Past Medical History:  Diagnosis Date   Anxiety    Bipolar disorder (HCC)    Breast mass, right    GERD (gastroesophageal reflux disease)    Hyperlipidemia    Post-operative nausea and vomiting    Smoker     Encounter Details:  Community Questionnaire - 02/18/23 1240       Questionnaire   Ask client: Do you give verbal consent for me to treat you today? Yes    Student Assistance N/A    Location Patient Served  [facility]    Encounter Setting CN site    Population Status Unhoused    Insurance Medicaid    Insurance/Financial Assistance Referral N/A    Medication N/A    Medical Provider Yes    Screening Referrals Made N/A    Medical Referrals Made N/A    Medical Appointment Completed N/A    CNP Interventions Advocate/Support;Case Management    Screenings CN Performed N/A    ED Visit Averted N/A    Life-Saving Intervention Made N/A           Client came to nurse's office requesting underpads and reporting feet discomfort. Client had old shoes with limited support. Requested new shows for client from [facility] SW. SW getting underpads from upstairs at [facility]. Client also expressed concern from recent hair loss. Client is taking Depakote  and this is listed as possible side effect. Encouraged client to discuss with BH at next appt. Encouraged client to write any medical concerns for upcoming PCP appointment to help her remember when she see the doctor. Offered support and encouragement. Ellanore Vanhook W RN CN

## 2023-02-20 ENCOUNTER — Other Ambulatory Visit: Payer: Self-pay

## 2023-02-20 ENCOUNTER — Telehealth (HOSPITAL_COMMUNITY): Payer: Self-pay

## 2023-02-20 NOTE — Progress Notes (Signed)
 Name: Chloe [name] DOB: 07/02/1965 MRN: [id number]  History of Present Illness: Chloe [name] is a 58 y.o. female who presents today as a new patient at [facility]. All available relevant medical records have been reviewed.   Previously seen at [facility] many years ago.  Today: She reports chief complaint of mixed urinary incontinence. She reports stress incontinence with cough/laugh/sneeze. She reports urge incontinence. She reports the UUI is predominant.  She leaks multiple times per day. Wears 2 diapers per day. She reports urinary incontinence is significantly bothersome.  She reports prior attempted treatment for these symptoms including pelvic floor physical therapy, reducing caffeine intake, and is minimizing evening / overnight fluid intake.   She reports urinary frequency and urgency.  She denies dysuria, gross hematuria, straining to void, or sensations of incomplete emptying.  She denies history of obstructive sleep apnea.  She reports history of persistent microscopic hematuria over the past year according to her gynecologist.  She denies history of kidney stones.  She denies history of pyelonephritis.  She denies history of recent or recurrent UTI. She denies history of GU malignancy or pelvic radiation.  She denies history of autoimmune disease. She reports history of smoking (has smoked 1 ppd for at least 30 years). She denies taking anticoagulants.   Fall Screening: Do you usually have a device to assist in your mobility? No   Medications: Current Outpatient Medications  Medication Sig Dispense Refill   acetaminophen  (TYLENOL ) 500 MG tablet Take 500-1,000 mg by mouth every 6 (six) hours as needed for moderate pain (pain score 4-6).     albuterol  (VENTOLIN  HFA) 108 (90 Base) MCG/ACT inhaler Inhale 2 puffs into the lungs every 6 (six) hours as needed for wheezing or shortness of breath. 8 g 1   azithromycin  (ZITHROMAX ) 500 MG tablet  Take 1 tablet (500 mg total) by mouth daily until finished. 4 tablet 0   divalproex  (DEPAKOTE ) 500 MG DR tablet Take 1 tablet (500 mg total) by mouth every 12 (twelve) hours. 60 tablet 3   fluticasone -salmeterol (ADVAIR ) 100-50 MCG/ACT AEPB Inhale 1 puff into the lungs 2 (two) times daily. 60 each 3   mupirocin  ointment (BACTROBAN ) 2 % Apply 1 Application topically 2 (two) times daily. To affected areas 22 g 0   nicotine  (NICODERM CQ  - DOSED IN MG/24 HOURS) 14 mg/24hr patch Place 1 patch (14 mg total) onto the skin daily. 28 patch 0   ondansetron  (ZOFRAN ) 4 MG tablet Take 1 tablet (4 mg total) by mouth every 6 (six) hours. 12 tablet 0   paliperidone  (INVEGA ) 3 MG 24 hr tablet Take 1 tablet (3 mg total) by mouth daily. (Patient taking differently: Take 3 mg by mouth 2 (two) times daily.) 30 tablet 3   pravastatin  (PRAVACHOL ) 40 MG tablet Take 1 tablet (40 mg total) by mouth daily. 90 tablet 1   tolterodine  (DETROL  LA) 2 MG 24 hr capsule Take 1 capsule (2 mg total) by mouth daily. 30 capsule 11   albuterol  (PROVENTIL ) (2.5 MG/3ML) 0.083% nebulizer solution Take 3 mLs (2.5 mg total) by nebulization every 6 (six) hours as needed for wheezing or shortness of breath. (Patient not taking: Reported on 02/26/2023) 180 mL 1   fluticasone  (FLONASE ) 50 MCG/ACT nasal spray Place 2 sprays into both nostrils daily. (Patient not taking: Reported on 02/26/2023) 16 g 1   No current facility-administered medications for this visit.    Allergies: Allergies  Allergen Reactions   Penicillins Hives  Demerol  [Meperidine ]     Past Medical History:  Diagnosis Date   Anxiety    Bipolar disorder (HCC)    Breast mass, right    GERD (gastroesophageal reflux disease)    Hyperlipidemia    Post-operative nausea and vomiting    Smoker    Past Surgical History:  Procedure Laterality Date   ABDOMINAL HYSTERECTOMY     had nausea and vomiting post-op   BREAST LUMPECTOMY WITH RADIOACTIVE SEED LOCALIZATION Right 11/12/2016    Procedure: RIGHT BREAST LUMPECTOMY WITH RADIOACTIVE SEED LOCALIZATION;  Surgeon: Vanderbilt Ned, MD;  Location: [facility];  Service: General;  Laterality: Right;   CHOLECYSTECTOMY     LAPAROSCOPIC LYSIS OF ADHESIONS     MYOMECTOMY     PLANTAR FASCIA SURGERY Left    Family History  Problem Relation Age of Onset   Lung cancer Mother    Breast cancer Maternal Aunt    Hyperlipidemia Brother    Colon cancer Neg Hx    Esophageal cancer Neg Hx    Stomach cancer Neg Hx    Social History   Socioeconomic History   Marital status: Divorced    Spouse name: Not on file   Number of children: Not on file   Years of education: Not on file   Highest education level: Associate degree: occupational, scientist, product/process development, or vocational program  Occupational History   Not on file  Tobacco Use   Smoking status: Every Day    Current packs/day: 0.50    Types: Cigarettes   Smokeless tobacco: Never  Vaping Use   Vaping status: Never Used  Substance and Sexual Activity   Alcohol use: Not Currently    Comment: social   Drug use: No   Sexual activity: Not Currently  Other Topics Concern   Not on file  Social History Narrative   Not on file   Social Drivers of Health   Financial Resource Strain: High Risk (03/26/2022)   Overall Financial Resource Strain (CARDIA)    Difficulty of Paying Living Expenses: Hard  Food Insecurity: No Food Insecurity (01/09/2023)   Hunger Vital Sign    Worried About Running Out of Food in the Last Year: Never true    Ran Out of Food in the Last Year: Never true  Transportation Needs: No Transportation Needs (01/09/2023)   PRAPARE - Administrator, Civil Service (Medical): No    Lack of Transportation (Non-Medical): No  Physical Activity: Inactive (03/26/2022)   Exercise Vital Sign    Days of Exercise per Week: 0 days    Minutes of Exercise per Session: 0 min  Stress: Stress Concern Present (03/26/2022)   [employer] of Occupational Health -  Occupational Stress Questionnaire    Feeling of Stress : Very much  Social Connections: Socially Isolated (03/26/2022)   Social Connection and Isolation Panel [NHANES]    Frequency of Communication with Friends and Family: Twice a week    Frequency of Social Gatherings with Friends and Family: Once a week    Attends Religious Services: Never    Database Administrator or Organizations: No    Attends Banker Meetings: Never    Marital Status: Divorced  Catering Manager Violence: Not At Risk (01/09/2023)   Humiliation, Afraid, Rape, and Kick questionnaire    Fear of Current or Ex-Partner: No    Emotionally Abused: No    Physically Abused: No    Sexually Abused: No    SUBJECTIVE  Review of Systems Constitutional:  Patient denies any unintentional weight loss or change in strength lntegumentary: Patient denies any rashes or pruritus Cardiovascular: Patient denies chest pain or syncope Respiratory: Patient denies shortness of breath Gastrointestinal: Patient denies nausea, vomiting, constipation, or diarrhea Musculoskeletal: Patient denies muscle cramps or weakness Neurologic: Patient denies convulsions or seizures Allergic/Immunologic: Patient denies recent allergic reaction(s) Hematologic/Lymphatic: Patient denies bleeding tendencies Endocrine: Patient denies heat/cold intolerance  GU: As per HPI.  OBJECTIVE Vitals:   02/26/23 1041  BP: (!) 149/84  Pulse: 76  Temp: 97.6 F (36.4 C)   There is no height or weight on file to calculate BMI.  Physical Examination Constitutional: No obvious distress; patient is non-toxic appearing  Cardiovascular: No visible lower extremity edema.  Respiratory: The patient does not have audible wheezing/stridor; respirations do not appear labored  Gastrointestinal: Abdomen non-distended Musculoskeletal: Normal ROM of UEs  Skin: No obvious rashes/open sores  Neurologic: CN 2-12 grossly intact Psychiatric: Answered questions  appropriately with normal affect  Hematologic/Lymphatic/Immunologic: No obvious bruises or sites of spontaneous bleeding  Urine microscopy: 3-10 RBC/hpf, otherwise unremarkable PVR: 9 ml  ASSESSMENT Stress incontinence of urine - Plan: Urinalysis, Routine w reflex microscopic, BLADDER SCAN AMB NON-IMAGING  Urge incontinence  Urinary frequency  Urinary urgency  Persistent microscopic hematuria - Plan: tolterodine  (DETROL  LA) 2 MG 24 hr capsule, CT HEMATURIA WORKUP  Tobacco dependency - Plan: tolterodine  (DETROL  LA) 2 MG 24 hr capsule, CT HEMATURIA WORKUP  We discussed the different forms of urinary incontinence, such as stress and urge incontinence, and described how symptoms are consistent with mixed urinary incontinence. We agreed to prioritize the predominant urge incontinence at this time.   For treatment of OAB with urinary frequency, nocturia, urgency, and urge incontinence: We discussed the symptoms of overactive bladder (OAB), which include urinary urgency, frequency, nocturia, with or without urge incontinence.  We discussed the following management options in detail including potential benefits, risks, and side effects: Behavioral therapy: Modify fluid intake Decreasing bladder irritants (such as caffeine) Medication(s): - For anticholinergic medications, we discussed the potential side effects of anticholinergics including dry eyes, dry mouth, constipation, cognitive impairment and urinary retention.    She decided to proceed with trial of Detrol  (Tolterodine ) 2 mg daily.  For persistent microscopic hematuria we discussed possible etiologies including but not limited to: vigorous exercise, sexual activity, stone, trauma, blood thinner use, urinary tract infection, urethral irritation secondary to vaginal atrophy, chronic kidney disease, glomerulonephropathy, malignancy. We discussed pt's smoking as a risk factor for GU cancer and encouraged smoking cessation.  We reviewed  the AUA 2020 [facility] guideline and risk stratification for this patient. Based on individual risk factors, pt was advised that the recommended workup includes CT hematuria protocol and cystoscopy.   Pt decided to pursue this work-up and follow-up afterward to discuss the results and formulate a treatment plan based on the findings. All questions were answered.   PLAN Advised the following: 1. Detrol  (Tolterodine ) 2 mg daily. 2. Minimize caffeine intake. 3. CT hematuria protocol. 4. Requesting previous records from [facility]. 5. Return for 1st available cystoscopy with any urology MD.  Orders Placed This Encounter  Procedures   CT HEMATURIA WORKUP    Standing Status:   Future    Expiration Date:   02/26/2024    Reason for Exam (SYMPTOM  OR DIAGNOSIS REQUIRED):   Microscopic hematuria    Is patient pregnant?:   No    Preferred imaging location?:   [facility]   Urinalysis, Routine w reflex microscopic  BLADDER SCAN AMB NON-IMAGING    It has been explained that the patient is to follow regularly with their PCP in addition to all other providers involved in their care and to follow instructions provided by these respective offices. Patient advised to contact urology clinic if any urologic-pertaining questions, concerns, new symptoms or problems arise in the interim period.  There are no Patient Instructions on file for this visit.  Electronically signed by:  Lauraine KYM Oz, MSN, FNP-C, CUNP 02/26/2023 11:19 AM

## 2023-02-24 ENCOUNTER — Other Ambulatory Visit (HOSPITAL_COMMUNITY): Payer: Self-pay

## 2023-02-24 ENCOUNTER — Other Ambulatory Visit: Payer: Self-pay

## 2023-02-24 ENCOUNTER — Emergency Department (HOSPITAL_COMMUNITY)
Admission: EM | Admit: 2023-02-24 | Discharge: 2023-02-24 | Disposition: A | Discharge status: Home / Self Care | Payer: MEDICAID | Attending: Emergency Medicine | Admitting: Emergency Medicine

## 2023-02-24 DIAGNOSIS — R197 Diarrhea, unspecified: Secondary | ICD-10-CM | POA: Diagnosis not present

## 2023-02-24 DIAGNOSIS — R112 Nausea with vomiting, unspecified: Secondary | ICD-10-CM | POA: Insufficient documentation

## 2023-02-24 LAB — CBC
HCT: 51 % — ABNORMAL HIGH (ref 36.0–46.0)
Hemoglobin: 17.5 g/dL — ABNORMAL HIGH (ref 12.0–15.0)
MCH: 31.2 pg (ref 26.0–34.0)
MCHC: 34.3 g/dL (ref 30.0–36.0)
MCV: 90.9 fL (ref 80.0–100.0)
Platelets: 293 10*3/uL (ref 150–400)
RBC: 5.61 MIL/uL — ABNORMAL HIGH (ref 3.87–5.11)
RDW: 13.1 % (ref 11.5–15.5)
WBC: 11.3 10*3/uL — ABNORMAL HIGH (ref 4.0–10.5)
nRBC: 0 % (ref 0.0–0.2)

## 2023-02-24 LAB — URINALYSIS, ROUTINE W REFLEX MICROSCOPIC
Glucose, UA: NEGATIVE mg/dL
Hgb urine dipstick: NEGATIVE
Ketones, ur: NEGATIVE mg/dL
Leukocytes,Ua: NEGATIVE
Nitrite: NEGATIVE
Protein, ur: 100 mg/dL — AB
Specific Gravity, Urine: 1.029 (ref 1.005–1.030)
pH: 5 (ref 5.0–8.0)

## 2023-02-24 LAB — COMPREHENSIVE METABOLIC PANEL
ALT: 24 U/L (ref 0–44)
AST: 29 U/L (ref 15–41)
Albumin: 3.9 g/dL (ref 3.5–5.0)
Alkaline Phosphatase: 83 U/L (ref 38–126)
Anion gap: 13 (ref 5–15)
BUN: 17 mg/dL (ref 6–20)
CO2: 18 mmol/L — ABNORMAL LOW (ref 22–32)
Calcium: 8.6 mg/dL — ABNORMAL LOW (ref 8.9–10.3)
Chloride: 106 mmol/L (ref 98–111)
Creatinine, Ser: 0.63 mg/dL (ref 0.44–1.00)
GFR, Estimated: >60 mL/min (ref >60)
Glucose, Bld: 121 mg/dL — ABNORMAL HIGH (ref 70–99)
Potassium: 4.1 mmol/L (ref 3.5–5.1)
Sodium: 137 mmol/L (ref 135–145)
Total Bilirubin: 0.5 mg/dL (ref 0.0–1.2)
Total Protein: 7.6 g/dL (ref 6.5–8.1)

## 2023-02-24 LAB — LIPASE, BLOOD: Lipase: 29 U/L (ref 11–51)

## 2023-02-24 MED ORDER — ONDANSETRON HCL 4 MG/2ML IJ SOLN
4.0000 mg | Freq: Once | INTRAMUSCULAR | Status: AC
Start: 2023-02-24 — End: 2023-02-24
  Administered 2023-02-24: 4 mg via INTRAVENOUS
  Filled 2023-02-24: qty 2

## 2023-02-24 MED ORDER — ONDANSETRON HCL 4 MG PO TABS
4.0000 mg | ORAL_TABLET | Freq: Four times a day (QID) | ORAL | 0 refills | Status: DC
  Filled 2023-02-24 – 2023-02-25 (×2): qty 12, 3d supply, fill #0

## 2023-02-24 MED ORDER — SODIUM CHLORIDE 0.9 % IV BOLUS
1000.0000 mL | Freq: Once | INTRAVENOUS | Status: AC
  Administered 2023-02-24: 1000 mL via INTRAVENOUS

## 2023-02-24 MED ORDER — MORPHINE SULFATE (PF) 4 MG/ML IV SOLN
4.0000 mg | Freq: Once | INTRAVENOUS | Status: AC
Start: 2023-02-24 — End: 2023-02-24
  Administered 2023-02-24: 4 mg via INTRAVENOUS
  Filled 2023-02-24: qty 1

## 2023-02-24 NOTE — ED Provider Notes (Signed)
 [place] EMERGENCY DEPARTMENT AT [facility] Provider Note   CSN: [id number] Arrival date & time: 02/24/23  9281     History  Chief Complaint  Patient presents with   Nausea   Emesis   Diarrhea    Chloe [name] is a 58 y.o. female.  58 year old female with prior medical history as detailed below presents for evaluation.  Patient reports onset of diarrhea with nausea and vomiting of the last 12 hours.  She denies abdominal discomfort.  She denies fever.  She reports multiple sick contacts with similar nausea, vomiting, diarrhea symptoms.  The history is provided by the patient and medical records.       Home Medications Prior to Admission medications   Medication Sig Start Date End Date Taking? Authorizing Provider  acetaminophen  (TYLENOL ) 500 MG tablet Take 500-1,000 mg by mouth every 6 (six) hours as needed for moderate pain (pain score 4-6).    [provider]  albuterol  (PROVENTIL ) (2.5 MG/3ML) 0.083% nebulizer solution Take 3 mLs (2.5 mg total) by nebulization every 6 (six) hours as needed for wheezing or shortness of breath. 01/06/23   Fleming, Zelda W, NP  albuterol  (VENTOLIN  HFA) 108 (90 Base) MCG/ACT inhaler Inhale 2 puffs into the lungs every 6 (six) hours as needed for wheezing or shortness of breath. 10/26/22   Rising, Asberry, PA-C  azithromycin  (ZITHROMAX ) 500 MG tablet Take 1 tablet (500 mg total) by mouth daily until finished. 01/15/23   Brien Belvie BRAVO, MD  divalproex  (DEPAKOTE ) 500 MG DR tablet Take 1 tablet (500 mg total) by mouth every 12 (twelve) hours. 11/14/22   Harl Zane BRAVO, NP  fluticasone  (FLONASE ) 50 MCG/ACT nasal spray Place 2 sprays into both nostrils daily. Patient not taking: Reported on 02/03/2023 01/06/23   Fleming, Zelda W, NP  fluticasone -salmeterol (ADVAIR ) 100-50 MCG/ACT AEPB Inhale 1 puff into the lungs 2 (two) times daily. Patient not taking: Reported on 02/03/2023 11/20/22   Brien Belvie BRAVO, MD  mupirocin   ointment (BACTROBAN ) 2 % Apply 1 Application topically 2 (two) times daily. To affected areas 01/01/23   Brien Belvie BRAVO, MD  nicotine  (NICODERM CQ  - DOSED IN MG/24 HOURS) 14 mg/24hr patch Place 1 patch (14 mg total) onto the skin daily. 01/10/23   Samtani, Jai-Gurmukh, MD  paliperidone  (INVEGA ) 3 MG 24 hr tablet Take 1 tablet (3 mg total) by mouth daily. Patient taking differently: Take 3 mg by mouth 2 (two) times daily. 12/30/22   Harl Zane BRAVO, NP  pravastatin  (PRAVACHOL ) 40 MG tablet Take 1 tablet (40 mg total) by mouth daily. 09/03/22   Brien Belvie BRAVO, MD      Allergies    Penicillins and Demerol  [meperidine ]    Review of Systems   Review of Systems  All other systems reviewed and are negative.   Physical Exam Updated Vital Signs BP 103/65 (BP Location: Right Arm)   Pulse 73   Temp 98.4 F (36.9 C) (Oral)   Resp 14   Ht 5' 1 (1.549 m)   Wt 69 kg   SpO2 90% Comment: sats drop when sleeping.  BMI 28.74 kg/m  Physical Exam Vitals and nursing note reviewed.  Constitutional:      General: She is not in acute distress.    Appearance: Normal appearance. She is well-developed.  HENT:     Head: Normocephalic and atraumatic.  Eyes:     Conjunctiva/sclera: Conjunctivae normal.     Pupils: Pupils are equal, round, and reactive to light.  Cardiovascular:     Rate and Rhythm: Normal rate and regular rhythm.     Heart sounds: Normal heart sounds.  Pulmonary:     Effort: Pulmonary effort is normal. No respiratory distress.     Breath sounds: Normal breath sounds.  Abdominal:     General: There is no distension.     Palpations: Abdomen is soft.     Tenderness: There is no abdominal tenderness.  Musculoskeletal:        General: No deformity. Normal range of motion.     Cervical back: Normal range of motion and neck supple.  Skin:    General: Skin is warm and dry.  Neurological:     General: No focal deficit present.     Mental Status: She is alert and oriented to  person, place, and time.     ED Results / Procedures / Treatments   Labs (all labs ordered are listed, but only abnormal results are displayed) Labs Reviewed  CBC - Abnormal; Notable for the following components:      Result Value   WBC 11.3 (*)    RBC 5.61 (*)    Hemoglobin 17.5 (*)    HCT 51.0 (*)    All other components within normal limits  LIPASE, BLOOD  COMPREHENSIVE METABOLIC PANEL  URINALYSIS, ROUTINE W REFLEX MICROSCOPIC    EKG None  Radiology No results found.  Procedures Procedures    Medications Ordered in ED Medications  ondansetron  (ZOFRAN ) injection 4 mg (4 mg Intravenous Given 02/24/23 0812)  sodium chloride  0.9 % bolus 1,000 mL (1,000 mLs Intravenous New Bag/Given 02/24/23 0830)  morphine  (PF) 4 MG/ML injection 4 mg (4 mg Intravenous Given 02/24/23 0830)    ED Course/ Medical Decision Making/ A&P                                 Medical Decision Making Amount and/or Complexity of Data Reviewed Labs: ordered.  Risk Prescription drug management.    Medical Screen Complete  This patient presented to the ED with complaint of nausea, vomiting, diarrhea.  This complaint involves an extensive number of treatment options. The initial differential diagnosis includes, but is not limited to, enteritis, metabolic abnormality, etc.  This presentation is: Acute, Self-Limited, Previously Undiagnosed, Uncertain Prognosis, Complicated, Systemic Symptoms, and Threat to Life/Bodily Function  Patient with complaint of nausea, vomiting, diarrhea.  Screening labs obtained are without significant abnormality.    Patient feeling much improved after IV fluids, antiemetics.   She is taking p.o. well prior to discharge.  Importance of close follow-up stressed.  Strict return precautions given and understood.  Additional history obtained:  External records from outside sources obtained and reviewed including prior ED visits and prior Inpatient records.   Problem  List / ED Course:  Nausea, vomiting, diarrhea   Reevaluation:  After the interventions noted above, I reevaluated the patient and found that they have: improved   Disposition:  After consideration of the diagnostic results and the patients response to treatment, I feel that the patent would benefit from close outpatient follow-up.          Final Clinical Impression(s) / ED Diagnoses Final diagnoses:  Nausea vomiting and diarrhea    Rx / DC Orders ED Discharge Orders          Ordered    ondansetron  (ZOFRAN ) 4 MG tablet  Every 6 hours        02/24/23  1242              Laurice Maude BROCKS, MD 02/25/23 2348

## 2023-02-24 NOTE — Discharge Instructions (Addendum)
 Return for any problem.  ?

## 2023-02-24 NOTE — ED Triage Notes (Signed)
 Pt BIBA from [facility]. C/o N/V/D for 1x days. Several known sick contacts. No abd pain.  AOx4

## 2023-02-25 ENCOUNTER — Other Ambulatory Visit: Payer: Self-pay

## 2023-02-26 ENCOUNTER — Ambulatory Visit: Payer: MEDICAID | Admitting: Urology

## 2023-02-26 ENCOUNTER — Other Ambulatory Visit: Payer: Self-pay

## 2023-02-26 ENCOUNTER — Encounter: Payer: Self-pay | Admitting: Urology

## 2023-02-26 VITALS — BP 149/84 | HR 76 | Temp 97.6°F

## 2023-02-26 DIAGNOSIS — N3946 Mixed incontinence: Secondary | ICD-10-CM | POA: Diagnosis not present

## 2023-02-26 DIAGNOSIS — R350 Frequency of micturition: Secondary | ICD-10-CM | POA: Diagnosis not present

## 2023-02-26 DIAGNOSIS — R3129 Other microscopic hematuria: Secondary | ICD-10-CM | POA: Diagnosis not present

## 2023-02-26 DIAGNOSIS — F17200 Nicotine dependence, unspecified, uncomplicated: Secondary | ICD-10-CM

## 2023-02-26 DIAGNOSIS — N393 Stress incontinence (female) (male): Secondary | ICD-10-CM

## 2023-02-26 DIAGNOSIS — N3941 Urge incontinence: Secondary | ICD-10-CM

## 2023-02-26 DIAGNOSIS — R3915 Urgency of urination: Secondary | ICD-10-CM

## 2023-02-26 LAB — MICROSCOPIC EXAMINATION: Bacteria, UA: NONE SEEN

## 2023-02-26 LAB — URINALYSIS, ROUTINE W REFLEX MICROSCOPIC
Bilirubin, UA: NEGATIVE
Glucose, UA: NEGATIVE
Ketones, UA: NEGATIVE
Leukocytes,UA: NEGATIVE
Nitrite, UA: NEGATIVE
Protein,UA: NEGATIVE
Specific Gravity, UA: 1.025 (ref 1.005–1.030)
Urobilinogen, Ur: 0.2 mg/dL (ref 0.2–1.0)
pH, UA: 6 (ref 5.0–7.5)

## 2023-02-26 MED ORDER — TOLTERODINE TARTRATE ER 2 MG PO CP24
2.0000 mg | ORAL_CAPSULE | Freq: Every day | ORAL | 11 refills | Status: AC
  Filled 2023-02-26: qty 30, 30d supply, fill #0
  Filled 2023-04-01: qty 30, 30d supply, fill #1
  Filled 2023-05-06: qty 30, 30d supply, fill #2
  Filled 2023-08-12: qty 30, 30d supply, fill #3
  Filled 2023-10-14: qty 30, 30d supply, fill #4
  Filled 2023-12-11: qty 30, 30d supply, fill #5
  Filled 2024-02-01: qty 30, 30d supply, fill #6

## 2023-02-26 NOTE — Progress Notes (Signed)
PVR 9 ?

## 2023-02-27 ENCOUNTER — Other Ambulatory Visit: Payer: Self-pay

## 2023-03-06 ENCOUNTER — Encounter: Payer: Self-pay | Admitting: Family Medicine

## 2023-03-06 ENCOUNTER — Other Ambulatory Visit: Payer: Self-pay

## 2023-03-06 ENCOUNTER — Ambulatory Visit: Payer: MEDICAID | Attending: Family Medicine | Admitting: Family Medicine

## 2023-03-06 ENCOUNTER — Telehealth: Payer: Self-pay

## 2023-03-06 VITALS — BP 130/82 | HR 88 | Ht 61.0 in | Wt 162.8 lb

## 2023-03-06 DIAGNOSIS — J441 Chronic obstructive pulmonary disease with (acute) exacerbation: Secondary | ICD-10-CM

## 2023-03-06 DIAGNOSIS — Z23 Encounter for immunization: Secondary | ICD-10-CM

## 2023-03-06 DIAGNOSIS — Z5901 Sheltered homelessness: Secondary | ICD-10-CM

## 2023-03-06 DIAGNOSIS — F315 Bipolar disorder, current episode depressed, severe, with psychotic features: Secondary | ICD-10-CM

## 2023-03-06 DIAGNOSIS — K219 Gastro-esophageal reflux disease without esophagitis: Secondary | ICD-10-CM | POA: Diagnosis not present

## 2023-03-06 DIAGNOSIS — E782 Mixed hyperlipidemia: Secondary | ICD-10-CM | POA: Diagnosis not present

## 2023-03-06 DIAGNOSIS — F17210 Nicotine dependence, cigarettes, uncomplicated: Secondary | ICD-10-CM

## 2023-03-06 MED ORDER — TRELEGY ELLIPTA 100-62.5-25 MCG/ACT IN AEPB
1.0000 | INHALATION_SPRAY | Freq: Every day | RESPIRATORY_TRACT | 6 refills | Status: DC
  Filled 2023-03-06: qty 60, 30d supply, fill #0
  Filled 2023-04-22: qty 60, 30d supply, fill #1

## 2023-03-06 MED ORDER — PRAVASTATIN SODIUM 40 MG PO TABS
40.0000 mg | ORAL_TABLET | Freq: Every day | ORAL | 1 refills | Status: DC
Start: 2023-03-06 — End: 2023-06-10
  Filled 2023-03-06 – 2023-04-22 (×2): qty 90, 90d supply, fill #0

## 2023-03-06 MED ORDER — ALBUTEROL SULFATE HFA 108 (90 BASE) MCG/ACT IN AERS
2.0000 | INHALATION_SPRAY | Freq: Four times a day (QID) | RESPIRATORY_TRACT | 1 refills | Status: AC | PRN
  Filled 2023-03-06: qty 18, 25d supply, fill #0

## 2023-03-06 MED ORDER — OMEPRAZOLE 20 MG PO CPDR
20.0000 mg | DELAYED_RELEASE_CAPSULE | Freq: Two times a day (BID) | ORAL | 0 refills | Status: DC
  Filled 2023-03-06: qty 28, 14d supply, fill #0

## 2023-03-06 NOTE — Patient Instructions (Signed)
VISIT SUMMARY:  During today's visit, we reviewed your ongoing health conditions, including COPD, GERD, hyperlipidemia, and overactive bladder. We discussed your smoking habits and efforts to quit, as well as the significant life stressors you are facing. Adjustments were made to your COPD treatment, and we addressed your other health concerns. Vaccinations were administered, and a referral was made to assist with housing.  YOUR PLAN:  -CHRONIC OBSTRUCTIVE PULMONARY DISEASE (COPD): COPD is a chronic lung condition that makes it hard to breathe. We are replacing your Advair with Trelegy to help better control your symptoms. Additionally, we will order a CT scan to screen for lung cancer due to your long history of smoking.  -GASTROESOPHAGEAL REFLUX DISEASE (GERD): GERD is a condition where stomach acid frequently flows back into the tube connecting your mouth and stomach. Continue taking Omeprazole once daily in the morning on an empty stomach to manage your symptoms.  -HYPERLIPIDEMIA: Hyperlipidemia means you have high levels of fats (lipids) in your blood. Your cholesterol levels were normal in August, so continue taking Pravastatin as prescribed. Your prescription has been refilled.  -OVERACTIVE BLADDER: Overactive bladder is a condition where you feel a sudden urge to urinate. Continue with your current medication as it seems to be working well.  -TOBACCO USE: You are currently smoking about a pack a day and have been doing so for approximately 40 years. Continue using the nicotine patches to help you quit smoking.  -GENERAL HEALTH MAINTENANCE: You received the influenza and Pneumovax 23 vaccines today. We have also referred you to value-based care to assist with housing.  INSTRUCTIONS:  Please schedule a follow-up appointment in three months. Additionally, we will be arranging a CT scan for lung cancer screening.

## 2023-03-06 NOTE — Telephone Encounter (Signed)
Pharmacy Patient Advocate Encounter   Received notification from CoverMyMeds that prior authorization for TRELEGY is required/requested.   Insurance verification completed.   The patient is insured through [employer] .   Per test claim: PA required; PA submitted to above mentioned insurance via CoverMyMeds Key/confirmation #/EOC [id number] Status is pending

## 2023-03-06 NOTE — Progress Notes (Signed)
Subjective:  Patient ID: Chloe [name], female    DOB: 1965/05/14  Age: 58 y.o. MRN: [id number]  CC: Medical Management of Chronic Issues   HPI Chloe [name] is a 58 y.o. year old female with a history of bipolar disorder (managed by [facility]), COPD, GERD, hyperlipidemia, nicotine dependence(1 ppd x40), overactive bladder.  Interval History: Discussed the use of AI scribe software for clinical note transcription with the patient, who gave verbal consent to proceed.   She presents for a routine follow-up. She reports ongoing smoking, currently at a rate of approximately one pack per day, a habit she has maintained for about forty years. She has recently reduced her smoking and is attempting to quit with the aid of nicotine patches.  The patient has also been diagnosed with overactive bladder and is currently on Detrol LA for this condition. Her GERD symptoms are controlled with Prilosec (omeprazole), which she takes daily. She reports that without this medication, her symptoms become unmanageable.  Her COPD symptoms have been difficult to control recently due to increased exposure to the elements as a result of homelessness. She is currently using an albuterol inhaler as needed and Advair for maintenance therapy. Bipolar disorder is managed by [facility] behavioral health.  The patient is also dealing with significant life stressors, including homelessness and unemployment. She is currently seeking disability benefits.        Past Medical History:  Diagnosis Date   Anxiety    Bipolar disorder (HCC)    Breast mass, right    GERD (gastroesophageal reflux disease)    Hyperlipidemia    Post-operative nausea and vomiting    Smoker     Past Surgical History:  Procedure Laterality Date   ABDOMINAL HYSTERECTOMY     had nausea and vomiting post-op   BREAST LUMPECTOMY WITH RADIOACTIVE SEED LOCALIZATION Right 11/12/2016   Procedure: RIGHT BREAST LUMPECTOMY WITH RADIOACTIVE SEED  LOCALIZATION;  Surgeon: Harriette Bouillon, MD;  Location: [facility];  Service: General;  Laterality: Right;   CHOLECYSTECTOMY     LAPAROSCOPIC LYSIS OF ADHESIONS     MYOMECTOMY     PLANTAR FASCIA SURGERY Left     Family History  Problem Relation Age of Onset   Lung cancer Mother    Breast cancer Maternal Aunt    Hyperlipidemia Brother    Colon cancer Neg Hx    Esophageal cancer Neg Hx    Stomach cancer Neg Hx     Social History   Socioeconomic History   Marital status: Divorced    Spouse name: Not on file   Number of children: Not on file   Years of education: Not on file   Highest education level: Associate degree: occupational, Scientist, product/process development, or vocational program  Occupational History   Not on file  Tobacco Use   Smoking status: Every Day    Current packs/day: 0.50    Types: Cigarettes   Smokeless tobacco: Never  Vaping Use   Vaping status: Never Used  Substance and Sexual Activity   Alcohol use: Not Currently    Comment: social   Drug use: No   Sexual activity: Not Currently  Other Topics Concern   Not on file  Social History Narrative   Not on file   Social Drivers of Health   Financial Resource Strain: High Risk (03/26/2022)   Overall Financial Resource Strain (CARDIA)    Difficulty of Paying Living Expenses: Hard  Food Insecurity: No Food Insecurity (01/09/2023)   Hunger Vital  Sign    Worried About Programme researcher, broadcasting/film/video in the Last Year: Never true    Ran Out of Food in the Last Year: Never true  Transportation Needs: No Transportation Needs (01/09/2023)   PRAPARE - Administrator, Civil Service (Medical): No    Lack of Transportation (Non-Medical): No  Physical Activity: Inactive (03/26/2022)   Exercise Vital Sign    Days of Exercise per Week: 0 days    Minutes of Exercise per Session: 0 min  Stress: Stress Concern Present (03/26/2022)   [employer] of Occupational Health - Occupational Stress Questionnaire    Feeling of Stress  : Very much  Social Connections: Socially Isolated (03/26/2022)   Social Connection and Isolation Panel [NHANES]    Frequency of Communication with Friends and Family: Twice a week    Frequency of Social Gatherings with Friends and Family: Once a week    Attends Religious Services: Never    Database administrator or Organizations: No    Attends Engineer, structural: Never    Marital Status: Divorced    Allergies  Allergen Reactions   Penicillins Hives   Demerol [Meperidine]     Outpatient Medications Prior to Visit  Medication Sig Dispense Refill   acetaminophen (TYLENOL) 500 MG tablet Take 500-1,000 mg by mouth every 6 (six) hours as needed for moderate pain (pain score 4-6).     albuterol (PROVENTIL) (2.5 MG/3ML) 0.083% nebulizer solution Take 3 mLs (2.5 mg total) by nebulization every 6 (six) hours as needed for wheezing or shortness of breath. (Patient not taking: Reported on 02/26/2023) 180 mL 1   azithromycin (ZITHROMAX) 500 MG tablet Take 1 tablet (500 mg total) by mouth daily until finished. 4 tablet 0   divalproex (DEPAKOTE) 500 MG DR tablet Take 1 tablet (500 mg total) by mouth every 12 (twelve) hours. 60 tablet 3   mupirocin ointment (BACTROBAN) 2 % Apply 1 Application topically 2 (two) times daily. To affected areas 22 g 0   nicotine (NICODERM CQ - DOSED IN MG/24 HOURS) 14 mg/24hr patch Place 1 patch (14 mg total) onto the skin daily. 28 patch 0   ondansetron (ZOFRAN) 4 MG tablet Take 1 tablet (4 mg total) by mouth every 6 (six) hours. 12 tablet 0   paliperidone (INVEGA) 3 MG 24 hr tablet Take 1 tablet (3 mg total) by mouth daily. (Patient taking differently: Take 3 mg by mouth 2 (two) times daily.) 30 tablet 3   tolterodine (DETROL LA) 2 MG 24 hr capsule Take 1 capsule (2 mg total) by mouth daily. 30 capsule 11   albuterol (VENTOLIN HFA) 108 (90 Base) MCG/ACT inhaler Inhale 2 puffs into the lungs every 6 (six) hours as needed for wheezing or shortness of breath. 8 g 1    fluticasone (FLONASE) 50 MCG/ACT nasal spray Place 2 sprays into both nostrils daily. (Patient not taking: Reported on 02/26/2023) 16 g 1   fluticasone-salmeterol (ADVAIR) 100-50 MCG/ACT AEPB Inhale 1 puff into the lungs 2 (two) times daily. 60 each 3   pravastatin (PRAVACHOL) 40 MG tablet Take 1 tablet (40 mg total) by mouth daily. 90 tablet 1   No facility-administered medications prior to visit.     ROS Review of Systems  Constitutional:  Negative for activity change and appetite change.  HENT:  Negative for sinus pressure and sore throat.   Respiratory:  Negative for chest tightness, shortness of breath and wheezing.   Cardiovascular:  Negative for chest pain  and palpitations.  Gastrointestinal:  Negative for abdominal distention, abdominal pain and constipation.  Genitourinary: Negative.   Musculoskeletal: Negative.   Psychiatric/Behavioral:  Negative for behavioral problems and dysphoric mood.     Objective:  BP 130/82   Pulse 88   Ht 5\' 1"  (1.549 m)   Wt 162 lb 12.8 oz (73.8 kg)   SpO2 94%   BMI 30.76 kg/m      03/06/2023    8:40 AM 02/26/2023   10:41 AM 02/24/2023   11:25 AM  BP/Weight  Systolic BP 130 149 104  Diastolic BP 82 84 63  Wt. (Lbs) 162.8    BMI 30.76 kg/m2        Physical Exam Constitutional:      Appearance: She is well-developed.  Cardiovascular:     Rate and Rhythm: Normal rate.     Heart sounds: Normal heart sounds. No murmur heard. Pulmonary:     Effort: Pulmonary effort is normal.     Breath sounds: Normal breath sounds. No wheezing or rales.  Chest:     Chest wall: No tenderness.  Abdominal:     General: Bowel sounds are normal. There is no distension.     Palpations: Abdomen is soft. There is no mass.     Tenderness: There is no abdominal tenderness.  Musculoskeletal:        General: Normal range of motion.     Right lower leg: No edema.     Left lower leg: No edema.  Neurological:     Mental Status: She is alert and oriented to  person, place, and time.  Psychiatric:        Mood and Affect: Mood normal.        Latest Ref Rng & Units 02/24/2023    8:10 AM 01/10/2023    5:40 AM 01/08/2023    5:27 PM  CMP  Glucose 70 - 99 mg/dL 161  83  096   BUN 6 - 20 mg/dL 17  15  12    Creatinine 0.44 - 1.00 mg/dL 0.45  4.09  8.11   Sodium 135 - 145 mmol/L 137  138  138   Potassium 3.5 - 5.1 mmol/L 4.1  3.6  3.4   Chloride 98 - 111 mmol/L 106  104  99   CO2 22 - 32 mmol/L 18  25  29    Calcium 8.9 - 10.3 mg/dL 8.6  8.9  9.7   Total Protein 6.5 - 8.1 g/dL 7.6  6.6  7.6   Total Bilirubin 0.0 - 1.2 mg/dL 0.5  0.5  0.3   Alkaline Phos 38 - 126 U/L 83  70  76   AST 15 - 41 U/L 29  15  15    ALT 0 - 44 U/L 24  17  17      Lipid Panel     Component Value Date/Time   CHOL 163 10/01/2022 0710   CHOL 151 04/04/2022 1033   TRIG 96 10/01/2022 0710   HDL 51 10/01/2022 0710   HDL 44 04/04/2022 1033   CHOLHDL 3.2 10/01/2022 0710   VLDL 19 10/01/2022 0710   LDLCALC 93 10/01/2022 0710   LDLCALC 86 04/04/2022 1033    CBC    Component Value Date/Time   WBC 11.3 (H) 02/24/2023 0810   RBC 5.61 (H) 02/24/2023 0810   HGB 17.5 (H) 02/24/2023 0810   HGB 14.8 07/01/2022 1619   HCT 51.0 (H) 02/24/2023 0810   HCT 44.0 07/01/2022 1619   PLT  293 02/24/2023 0810   PLT 359 07/01/2022 1619   MCV 90.9 02/24/2023 0810   MCV 92 07/01/2022 1619   MCH 31.2 02/24/2023 0810   MCHC 34.3 02/24/2023 0810   RDW 13.1 02/24/2023 0810   RDW 12.6 07/01/2022 1619   LYMPHSABS 3.4 01/08/2023 1727   LYMPHSABS 3.2 (H) 07/01/2022 1619   MONOABS 0.7 01/08/2023 1727   EOSABS 0.3 01/08/2023 1727   EOSABS 0.4 07/01/2022 1619   BASOSABS 0.1 01/08/2023 1727   BASOSABS 0.1 07/01/2022 1619    Lab Results  Component Value Date   HGBA1C 5.4 10/01/2022    Assessment & Plan:      Chronic Obstructive Pulmonary Disease (COPD) Persistent symptoms despite current use of Albuterol and Advair. Patient has been exposed to cold weather and has a 40-year  history of smoking. -Replace Advair with Trelegy for better symptom control. -Order a CT scan for lung cancer screening given the patient's smoking history.  Gastroesophageal Reflux Disease (GERD) Symptoms persist without medication. -Prescribe Omeprazole to be taken once daily in the morning on an empty stomach.  Hyperlipidemia Cholesterol levels were normal in August. -Continue Pravastatin and refill prescription.  Overactive Bladder Patient recently started medication for this condition at recent urology visit -No changes to current management.   Tobacco Use Patient currently smokes about a pack a day and has been smoking for approximately 40 years. -Patient has been provided with patches to assist with smoking cessation.  General Health Maintenance -Administer influenza and Pneumovax 20 vaccines today. -Refer patient to value-based care for assistance with housing. -Schedule follow-up appointment in three months.          Meds ordered this encounter  Medications   omeprazole (PRILOSEC) 20 MG capsule    Sig: Take 1 capsule (20 mg total) by mouth 2 (two) times daily before a meal.    Dispense:  28 capsule    Refill:  0   albuterol (VENTOLIN HFA) 108 (90 Base) MCG/ACT inhaler    Sig: Inhale 2 puffs into the lungs every 6 (six) hours as needed for wheezing or shortness of breath.    Dispense:  18 g    Refill:  1   pravastatin (PRAVACHOL) 40 MG tablet    Sig: Take 1 tablet (40 mg total) by mouth daily.    Dispense:  90 tablet    Refill:  1   Fluticasone-Umeclidin-Vilant (TRELEGY ELLIPTA) 100-62.5-25 MCG/ACT AEPB    Sig: Inhale 1 puff into the lungs daily.    Dispense:  60 each    Refill:  6    Discontinue Advair    Follow-up: Return in about 3 months (around 06/04/2023) for Chronic medical conditions.       Hoy Register, MD, FAAFP. [facility] [place], [place] [phone]   03/06/2023, 9:07 AM

## 2023-03-07 ENCOUNTER — Other Ambulatory Visit: Payer: Self-pay

## 2023-03-07 NOTE — Telephone Encounter (Signed)
Pharmacy Patient Advocate Encounter  Received notification from [facility] that Prior Authorization for TRELEGY has been APPROVED from 03/06/2023 to 03/05/2024

## 2023-03-10 ENCOUNTER — Other Ambulatory Visit: Payer: Self-pay | Admitting: Licensed Clinical Social Worker

## 2023-03-10 NOTE — Patient Outreach (Signed)
  Medicaid Managed Care Social Work Note  03/10/2023 Name:  Chloe [name] MRN:  [id number] DOB:  1965-04-26  Chloe [name] is an 58 y.o. year old female who is a primary patient of Chloe Register, MD.  The Medicaid Managed Care Coordination team was consulted for assistance with:  Community Resources   Ms. Chloe [name] was given information about Medicaid Managed Care Coordination team services today. Chloe [name] understands that she must contact her insurance provider to gain case management services.   Assessments/Interventions:  Review of past medical history, allergies, health status, including review of consultants reports, laboratory and other test data, was performed as part of comprehensive evaluation and provision of chronic care management services.  SDOH: (Social Drivers of Health) assessments and interventions performed: SDOH Interventions    Theme park manager Nurse Program from 02/05/2023 in [facility] to Hosp-Admission (Discharged) from 01/08/2023 in [facility]  SDOH Interventions    Housing Interventions Other (Comment)  [Pt staying in [facility]] Inpatient TOC, Other (Comment)  [Resources placed on AVS]       Care Plan                 Allergies  Allergen Reactions   Penicillins Hives   Demerol [Meperidine]     Medications Reviewed Today   Medications were not reviewed in this encounter     Patient Active Problem List   Diagnosis Date Noted   Chronic rhinitis 02/05/2023   Deviated nasal septum 02/05/2023   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 Patient is agreeable. Patient reports being interested in gaining specifically housing related resources. [facility] LCSW provided patient with extensive education on available housing resources within her area. Email sent as well. She denies any SI/HI but admits she is in a stressful situation regarding her housing and is eager to gain support. Patient agreeable to contact [facility] today. Contact information for her insurance was included in email.   Plan: Patient will contact [facility] to get connected to their case management program. [facility] LCSW will sign off at this time per [facility] program policy with patients that have [facility].  Dickie La, BSW, MSW, LCSW Licensed Clinical Social Worker [employer]   [facility] [email or web address] Direct Dial: [phone]

## 2023-03-10 NOTE — Patient Instructions (Addendum)
Tailored Plan Medicaid On July 1, some people on [place] Medicaid will move to a new kind of Medicaid health plan called a Tailored Plan. Tailored Plans cover your doctor visits, prescription drugs, and health care services.    If your [place] Medicaid will move to a Tailored Plan, you should have gotten a letter and welcome packet. If you're not sure, call your [place] Medicaid Enrollment Broker at [phone] and ask.  Check out these free materials, in [place] and [place], to learn more about your Tailored Plan: [email or web address]/Tailored-Plans/Toolkit  Tailored Care Management Services  TCM services are available to you now. If you are a Tailored Plan member or will be and want information about Tailored Care Management Services including rides to appointments and community and home services, call the Care Management provider for your county of residence:    [facility] ([place], [place])  Member Services: [phone] Behavioral Health Crisis Line: [phone], [place], [place], [place], [place])  Member Services: [phone] Behavioral Health Crisis Line: [phone]  Dickie La, BSW, MSW, LCSW Licensed Clinical Social Worker [place]   [facility] [email or web address] Direct Dial: [phone]   [facility] Berton Lan, Ignacia Palma) Member Services: [phone] Behavioral Health Crisis Line: [phone]

## 2023-03-11 ENCOUNTER — Other Ambulatory Visit (HOSPITAL_COMMUNITY): Payer: Self-pay

## 2023-03-17 ENCOUNTER — Other Ambulatory Visit: Payer: Self-pay

## 2023-03-17 ENCOUNTER — Ambulatory Visit (INDEPENDENT_AMBULATORY_CARE_PROVIDER_SITE_OTHER): Payer: MEDICAID | Admitting: Psychiatry

## 2023-03-17 ENCOUNTER — Encounter (HOSPITAL_COMMUNITY): Payer: Self-pay | Admitting: Psychiatry

## 2023-03-17 VITALS — BP 134/94 | HR 92 | Temp 97.9°F | Wt 162.4 lb

## 2023-03-17 DIAGNOSIS — F315 Bipolar disorder, current episode depressed, severe, with psychotic features: Secondary | ICD-10-CM | POA: Diagnosis not present

## 2023-03-17 DIAGNOSIS — F17200 Nicotine dependence, unspecified, uncomplicated: Secondary | ICD-10-CM

## 2023-03-17 MED ORDER — DIVALPROEX SODIUM 500 MG PO DR TAB
500.0000 mg | DELAYED_RELEASE_TABLET | Freq: Two times a day (BID) | ORAL | 3 refills | Status: DC
  Filled 2023-03-17 – 2023-03-28 (×3): qty 60, 30d supply, fill #0
  Filled 2023-04-22: qty 60, 30d supply, fill #1
  Filled 2023-06-01: qty 60, 30d supply, fill #2

## 2023-03-17 MED ORDER — PALIPERIDONE ER 3 MG PO TB24
3.0000 mg | ORAL_TABLET | Freq: Every day | ORAL | 3 refills | Status: DC
  Filled 2023-03-17 – 2023-03-28 (×3): qty 30, 30d supply, fill #0
  Filled 2023-04-22: qty 30, 30d supply, fill #1
  Filled 2023-06-01: qty 30, 30d supply, fill #2

## 2023-03-17 MED ORDER — NICOTINE 14 MG/24HR TD PT24
14.0000 mg | MEDICATED_PATCH | Freq: Every day | TRANSDERMAL | 3 refills | Status: DC
  Filled 2023-03-17: qty 28, 28d supply, fill #0

## 2023-03-17 NOTE — Progress Notes (Signed)
BH MD/PA/NP OP Progress Note       03/17/2023 3:27 PM Chloe [name]  MRN:  [id number]  Chief Complaint: "I have not been doing so great"    HPI: 58 year old female seen today for follow-up psychiatric evaluation.  She has a psychiatric history of bipolar affective disorder and tobacco dependence. She is currently managed on Depakote 500 mg twice daily, hydroxyzine 10 mg 3 times daily, Nicoderm CQ 14 mg patches, and Invega 3 mg daily.  She reports her medications are effective in managing her psychiatric condition.  Today she was well groomed, pleasant, cooperative, and engaged in conversation.  Patient thought process is clear and linear.  She informed Clinical research associate that things have not been so great.  She notes that she has been dealing with physical ailments.  Patient reports that her COPD has been exacerbated recently.  She continues to smoke a half a pack of cigarettes a day.  She does note that her consumption has reduced.  Patient also informed that she no longer is at the [facility] but now resides at [employer].  She reports that this program runs out in March and is hopeful that she would get a job prior to then.  Patient notes that recently she is applied to the [employer] and is hopeful that she will receive disposition.   Since her last visit she informed writer that her anxiety and depression continues to be well-managed.  Provider conducted a GAD-7 and patient scored a 12, at her last visit she scored a 16.  Provider also conducted PHQ-9 and patient scored a 9, at her last visit she scored a 14.  She endorses adequate sleep and appetite.  Today she denies SI/HI/AVH, mania, paranoia.   Patient reports that she is concerned about her older sister who had a stroke a few years ago.  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  She is hopeful that her disability will be approved.  No medication changes made today.  Patient agreeable  to continue medications as prescribed.  No other concerns noted at this time.      Visit Diagnosis:    ICD-10-CM   1. Bipolar affective disorder, depressed, severe, with psychotic behavior (HCC)  F31.5 paliperidone (INVEGA) 3 MG 24 hr tablet    divalproex (DEPAKOTE) 500 MG DR tablet        Past Psychiatric History: bipolar affective disorder and tobacco dependence, several psychiatric hospitalizations in the past. She was last admitted 25 November 2017 and was noted to have psychotic symptoms along with manic features during that hospital stay. She was discharged on risperidone, carbamazepine and trazodone at that time.  Past Medical History:  Past Medical History:  Diagnosis Date   Anxiety    Bipolar disorder (HCC)    Breast mass, right    GERD (gastroesophageal reflux disease)    Hyperlipidemia    Post-operative nausea and vomiting    Smoker     Past Surgical History:  Procedure Laterality Date   ABDOMINAL HYSTERECTOMY     had nausea and vomiting post-op   BREAST LUMPECTOMY WITH RADIOACTIVE SEED LOCALIZATION Right 11/12/2016   Procedure: RIGHT BREAST LUMPECTOMY WITH RADIOACTIVE SEED LOCALIZATION;  Surgeon: Harriette Bouillon, MD;  Location: [facility];  Service: General;  Laterality: Right;   CHOLECYSTECTOMY     LAPAROSCOPIC LYSIS OF ADHESIONS     MYOMECTOMY     PLANTAR FASCIA SURGERY Left  Family Psychiatric History: Unknown  Family History:  Family History  Problem Relation Age of Onset   Lung cancer Mother    Breast cancer Maternal Aunt    Hyperlipidemia Brother    Colon cancer Neg Hx    Esophageal cancer Neg Hx    Stomach cancer Neg Hx     Social History:  Social History   Socioeconomic History   Marital status: Divorced    Spouse name: Not on file   Number of children: Not on file   Years of education: Not on file   Highest education level: Associate degree: occupational, Scientist, product/process development, or vocational program  Occupational History   Not on  file  Tobacco Use   Smoking status: Every Day    Current packs/day: 0.50    Types: Cigarettes   Smokeless tobacco: Never  Vaping Use   Vaping status: Never Used  Substance and Sexual Activity   Alcohol use: Not Currently    Comment: social   Drug use: No   Sexual activity: Not Currently  Other Topics Concern   Not on file  Social History Narrative   Not on file   Social Drivers of Health   Financial Resource Strain: High Risk (03/26/2022)   Overall Financial Resource Strain (CARDIA)    Difficulty of Paying Living Expenses: Hard  Food Insecurity: No Food Insecurity (01/09/2023)   Hunger Vital Sign    Worried About Running Out of Food in the Last Year: Never true    Ran Out of Food in the Last Year: Never true  Transportation Needs: No Transportation Needs (01/09/2023)   PRAPARE - Administrator, Civil Service (Medical): No    Lack of Transportation (Non-Medical): No  Physical Activity: Inactive (03/26/2022)   Exercise Vital Sign    Days of Exercise per Week: 0 days    Minutes of Exercise per Session: 0 min  Stress: Stress Concern Present (03/26/2022)   [employer] of Occupational Health - Occupational Stress Questionnaire    Feeling of Stress : Very much  Social Connections: Socially Isolated (03/26/2022)   Social Connection and Isolation Panel [NHANES]    Frequency of Communication with Friends and Family: Twice a week    Frequency of Social Gatherings with Friends and Family: Once a week    Attends Religious Services: Never    Database administrator or Organizations: No    Attends Banker Meetings: Never    Marital Status: Divorced    Allergies:  Allergies  Allergen Reactions   Penicillins Hives   Demerol [Meperidine]     Metabolic Disorder Labs: Lab Results  Component Value Date   HGBA1C 5.4 10/01/2022   MPG 108 10/01/2022   MPG 102.54 11/30/2017   Lab Results  Component Value Date   PROLACTIN 12.4 11/30/2017   Lab Results   Component Value Date   CHOL 163 10/01/2022   TRIG 96 10/01/2022   HDL 51 10/01/2022   CHOLHDL 3.2 10/01/2022   VLDL 19 10/01/2022   LDLCALC 93 10/01/2022   LDLCALC 86 04/04/2022   Lab Results  Component Value Date   TSH 1.380 10/01/2022   TSH 0.840 07/01/2022    Therapeutic Level Labs: No results found for: "LITHIUM" No results found for: "VALPROATE" Lab Results  Component Value Date   CBMZ 8.3 11/30/2017    Current Medications: Current Outpatient Medications  Medication Sig Dispense Refill   acetaminophen (TYLENOL) 500 MG tablet Take 500-1,000 mg by mouth every 6 (six) hours as  needed for moderate pain (pain score 4-6).     albuterol (PROVENTIL) (2.5 MG/3ML) 0.083% nebulizer solution Take 3 mLs (2.5 mg total) by nebulization every 6 (six) hours as needed for wheezing or shortness of breath. (Patient not taking: Reported on 02/26/2023) 180 mL 1   albuterol (VENTOLIN HFA) 108 (90 Base) MCG/ACT inhaler Inhale 2 puffs into the lungs every 6 (six) hours as needed for wheezing or shortness of breath. 18 g 1   azithromycin (ZITHROMAX) 500 MG tablet Take 1 tablet (500 mg total) by mouth daily until finished. 4 tablet 0   divalproex (DEPAKOTE) 500 MG DR tablet Take 1 tablet (500 mg total) by mouth every 12 (twelve) hours. 60 tablet 3   Fluticasone-Umeclidin-Vilant (TRELEGY ELLIPTA) 100-62.5-25 MCG/ACT AEPB Inhale 1 puff into the lungs daily. 60 each 6   mupirocin ointment (BACTROBAN) 2 % Apply 1 Application topically 2 (two) times daily. To affected areas 22 g 0   nicotine (NICODERM CQ - DOSED IN MG/24 HOURS) 14 mg/24hr patch Place 1 patch (14 mg total) onto the skin daily. 28 patch 3   omeprazole (PRILOSEC) 20 MG capsule Take 1 capsule (20 mg total) by mouth 2 (two) times daily before a meal. 28 capsule 0   ondansetron (ZOFRAN) 4 MG tablet Take 1 tablet (4 mg total) by mouth every 6 (six) hours. 12 tablet 0   paliperidone (INVEGA) 3 MG 24 hr tablet Take 1 tablet (3 mg total) by mouth  daily. 30 tablet 3   pravastatin (PRAVACHOL) 40 MG tablet Take 1 tablet (40 mg total) by mouth daily. 90 tablet 1   tolterodine (DETROL LA) 2 MG 24 hr capsule Take 1 capsule (2 mg total) by mouth daily. 30 capsule 11   No current facility-administered medications for this visit.     Musculoskeletal: Strength & Muscle Tone: within normal limits,  Gait & Station: normal,  Patient leans: N/A  Psychiatric Specialty Exam: Review of Systems  There were no vitals taken for this visit.There is no height or weight on file to calculate BMI.  General Appearance: Well Groomed  Eye Contact:  Good  Speech:  Clear and Coherent and Pressured  Volume:  Normal  Mood:  Euthymic   Affect:  Appropriate and Congruent  Thought Process:  Coherent, Goal Directed, and Linear  Orientation:  Full (Time, Place, and Person)  Thought Content: WDL and Logical   Suicidal Thoughts:  No  Homicidal Thoughts:  No  Memory:  Immediate;   Good Recent;   Good Remote;   Good  Judgement:  Good  Insight:  Good  Psychomotor Activity:  Normal  Concentration:  Concentration: Good and Attention Span: Good  Recall:  Good  Fund of Knowledge: Good  Language: Good  Akathisia:  No  Handed:  Right  AIMS (if indicated): not done  Assets:  Communication Skills Desire for Improvement Housing Leisure Time Physical Health Social Support  ADL's:  Intact  Cognition: WNL  Sleep:  Good   Screenings: AUDIT    Flowsheet Row Admission (Discharged) from 09/28/2022 in [facility]  Alcohol Use Disorder Identification Test Final Score (AUDIT) 0 0      GAD-7    Flowsheet Row Clinical Support from 03/17/2023 in [facility] Office Visit from 12/10/2022 in [facility] - A Dept Of [place]. [facility] Clinical Support from 11/14/2022 in [facility]  Office Visit from 07/01/2022 in Center for [employer] at [facility] Office Visit from 04/04/2022 in [facility] - A Dept Of [place]. [facility]  Total GAD-7 Score 12 15 16 19 20       PHQ2-9    Flowsheet Row Clinical Support from 03/17/2023 in [facility] Office Visit from 12/10/2022 in [facility] - A Dept Of [place]. [facility] Clinical Support from 11/14/2022 in [facility] Office Visit from 07/01/2022 in Center for [facility] at [facility] Office Visit from 04/04/2022 in [facility] - A Dept Of . [facility]  PHQ-2 Total Score 2 2 6 4 2   PHQ-9 Total Score 9 14 14 21 16       Flowsheet Row [facility] Clinical Support from 11/14/2022 in [facility] ED from 11/04/2022 in [facility] at [facility] RISK CATEGORY No Risk Error: Q2 is Yes, you must answer 3, 4, and 5 No Risk        Assessment and Plan: Patient reports that she is doing well on her current medication regimen.  No medication changes made today.  Patient agreed to continue medication as prescribed.  1. Bipolar affective disorder, depressed, severe, with psychotic behavior (HCC)  Continue- paliperidone (INVEGA) 3 MG 24 hr tablet; Take 1 tablet (3 mg total) by mouth daily.  Dispense: 30 tablet; Refill: 3 Continue- divalproex (DEPAKOTE) 500 MG DR tablet; Take 1 tablet (500 mg total) by mouth every 12 (twelve) hours.  Dispense: 60 tablet; Refill: 3  2. Tobacco dependency (Primary)  Continue- nicotine (NICODERM CQ - DOSED IN MG/24 HOURS) 14 mg/24hr patch; Place 1 patch (14 mg total) onto the skin daily.  Dispense: 28 patch; Refill: 3     Follow-up in 2.56-month Follow-up with therapy at [facility], NP 03/17/2023, 3:27 PM

## 2023-03-24 ENCOUNTER — Ambulatory Visit (HOSPITAL_COMMUNITY): Payer: MEDICAID

## 2023-03-26 ENCOUNTER — Other Ambulatory Visit: Payer: Self-pay

## 2023-03-28 ENCOUNTER — Ambulatory Visit
Admission: RE | Admit: 2023-03-28 | Discharge: 2023-03-28 | Disposition: A | Discharge status: Home / Self Care | Payer: MEDICAID | Source: Ambulatory Visit | Attending: Family Medicine | Admitting: Family Medicine

## 2023-03-28 ENCOUNTER — Other Ambulatory Visit: Payer: Self-pay

## 2023-03-28 ENCOUNTER — Other Ambulatory Visit (HOSPITAL_COMMUNITY): Payer: Self-pay

## 2023-03-28 DIAGNOSIS — F17210 Nicotine dependence, cigarettes, uncomplicated: Secondary | ICD-10-CM

## 2023-03-31 ENCOUNTER — Other Ambulatory Visit: Payer: MEDICAID | Admitting: Urology

## 2023-03-31 ENCOUNTER — Other Ambulatory Visit: Payer: Self-pay

## 2023-04-01 ENCOUNTER — Other Ambulatory Visit: Payer: Self-pay

## 2023-04-02 ENCOUNTER — Other Ambulatory Visit: Payer: Self-pay

## 2023-04-15 ENCOUNTER — Telehealth: Payer: Self-pay

## 2023-04-15 NOTE — Telephone Encounter (Signed)
 Call to patient to advise per pcp " Please inform her that her CT to screen for lung cancer is negative for cancer but reveals presence of emphysema and cholesterol deposits around the arteries of her heart.  She needs to continue her cholesterol medication and a low-cholesterol diet." Unable to reach VM left.

## 2023-04-15 NOTE — Telephone Encounter (Signed)
 Copied from CRM [id number]. Topic: Clinical - Lab/Test Results >> Apr 15, 2023 10:42 AM Clayton Bibles wrote: Reason for CRM: Chloe [name] would like a nurse to call her back about her CT Chest lung cancer results.  Please call her back at [phone]

## 2023-04-17 ENCOUNTER — Encounter: Payer: Self-pay | Admitting: Physician Assistant

## 2023-04-17 ENCOUNTER — Encounter: Payer: Self-pay | Admitting: *Deleted

## 2023-04-17 NOTE — Congregational Nurse Program (Signed)
  Dept: [phone]   Congregational Nurse Program Note  Date of Encounter: 04/17/2023  Past Medical History: Past Medical History:  Diagnosis Date   Anxiety    Bipolar disorder (HCC)    Breast mass, right    GERD (gastroesophageal reflux disease)    Hyperlipidemia    Post-operative nausea and vomiting    Smoker     Encounter Details:  Community Questionnaire - 04/17/23 0910       Questionnaire   Ask client: Do you give verbal consent for me to treat you today? 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 Triage form completed. Blood pressure 124/63, pulse 84, temperature 98.2 F (36.8 C), temperature source Oral, SpO2 96%.

## 2023-04-17 NOTE — Progress Notes (Addendum)
 Pt had a recent CT chest for lung CA screening >> wants results. CT neg for lung CA, but has LAD and RCA atherosclerosis. Mild emphysema.   Pt concerned for her ears, they feel clogged, she has been getting a lot of wax. Although no sig wax, there is some fluid in her L ear but no infection.   No S&S of infection in sinuses, throat or nose.   Pt given Claritin, has Flonase, given instructions on using it.   She has been on mult ABX plus steroids, feels she has finally gotten mostly better.   However, still w/ some cough, congestion, wonders if she should continue to take Mucinex. She has some, will continue to use it.   Still smokes, up to 1 ppd. Encouragement given for quitting. Patch hurt her arm.    Meds reviewed, she is not using nebs, but has them, has completed ABX.      04/17/2023    9:07 AM 03/17/2023    2:53 PM 03/06/2023    8:40 AM  Vitals with BMI  Height   5\' 1"   Weight   162 lbs 13 oz  BMI   30.78  Systolic 124  130  Diastolic 63  82  Pulse 84  88     Information is confidential and restricted. Go to Review Flowsheets to unlock data.     Theodore Demark, PA-C 04/17/2023 9:30 AM   Prior to Admission medications   Medication Sig Start Date End Date Taking? Authorizing Provider  acetaminophen (TYLENOL) 500 MG tablet Take 500-1,000 mg by mouth every 6 (six) hours as needed for moderate pain (pain score 4-6).    [provider]  albuterol (PROVENTIL) (2.5 MG/3ML) 0.083% nebulizer solution Take 3 mLs (2.5 mg total) by nebulization every 6 (six) hours as needed for wheezing or shortness of breath. Patient not taking: Reported on 02/26/2023 01/06/23   Claiborne Rigg, NP  albuterol (VENTOLIN HFA) 108 (90 Base) MCG/ACT inhaler Inhale 2 puffs into the lungs every 6 (six) hours as needed for wheezing or shortness of breath. 03/06/23   Hoy Register, MD  azithromycin (ZITHROMAX) 500 MG tablet Take 1 tablet (500 mg total) by mouth daily until finished. 01/15/23    Storm Frisk, MD  divalproex (DEPAKOTE) 500 MG DR tablet Take 1 tablet (500 mg total) by mouth every 12 (twelve) hours. 03/17/23   Shanna Cisco, NP  Fluticasone-Umeclidin-Vilant (TRELEGY ELLIPTA) 100-62.5-25 MCG/ACT AEPB Inhale 1 puff into the lungs daily. 03/06/23   Hoy Register, MD  mupirocin ointment (BACTROBAN) 2 % Apply 1 Application topically 2 (two) times daily. To affected areas 01/01/23   Storm Frisk, MD  nicotine (NICODERM CQ - DOSED IN MG/24 HOURS) 14 mg/24hr patch Place 1 patch (14 mg total) onto the skin daily. 03/17/23   Shanna Cisco, NP  omeprazole (PRILOSEC) 20 MG capsule Take 1 capsule (20 mg total) by mouth 2 (two) times daily before a meal. 03/06/23   Newlin, Odette Horns, MD  ondansetron (ZOFRAN) 4 MG tablet Take 1 tablet (4 mg total) by mouth every 6 (six) hours. 02/24/23   Wynetta Fines, MD  paliperidone (INVEGA) 3 MG 24 hr tablet Take 1 tablet (3 mg total) by mouth daily. 03/17/23   Shanna Cisco, NP  pravastatin (PRAVACHOL) 40 MG tablet Take 1 tablet (40 mg total) by mouth daily. 03/06/23   Hoy Register, MD  tolterodine (DETROL LA) 2 MG 24 hr capsule Take 1 capsule (2 mg total) by mouth  daily. 02/26/23   Donnita Falls, FNP

## 2023-04-21 NOTE — Telephone Encounter (Signed)
 error

## 2023-04-22 ENCOUNTER — Other Ambulatory Visit: Payer: Self-pay

## 2023-04-23 ENCOUNTER — Other Ambulatory Visit: Payer: Self-pay

## 2023-04-28 ENCOUNTER — Other Ambulatory Visit: Payer: Self-pay

## 2023-04-29 ENCOUNTER — Ambulatory Visit (HOSPITAL_COMMUNITY): Payer: MEDICAID

## 2023-04-30 ENCOUNTER — Ambulatory Visit (HOSPITAL_COMMUNITY)
Admission: RE | Admit: 2023-04-30 | Discharge: 2023-04-30 | Disposition: A | Discharge status: Home / Self Care | Payer: MEDICAID | Source: Ambulatory Visit | Attending: Urology | Admitting: Urology

## 2023-04-30 ENCOUNTER — Ambulatory Visit (HOSPITAL_COMMUNITY): Payer: MEDICAID

## 2023-04-30 ENCOUNTER — Other Ambulatory Visit (HOSPITAL_COMMUNITY): Payer: MEDICAID

## 2023-04-30 ENCOUNTER — Other Ambulatory Visit: Payer: MEDICAID

## 2023-04-30 DIAGNOSIS — F17200 Nicotine dependence, unspecified, uncomplicated: Secondary | ICD-10-CM | POA: Insufficient documentation

## 2023-04-30 DIAGNOSIS — R3129 Other microscopic hematuria: Secondary | ICD-10-CM | POA: Insufficient documentation

## 2023-04-30 MED ORDER — IOHEXOL 350 MG/ML SOLN
100.0000 mL | Freq: Once | INTRAVENOUS | Status: AC | PRN
  Administered 2023-04-30: 100 mL via INTRAVENOUS

## 2023-05-07 ENCOUNTER — Other Ambulatory Visit: Payer: Self-pay

## 2023-05-21 ENCOUNTER — Ambulatory Visit: Payer: MEDICAID | Admitting: Urology

## 2023-05-21 VITALS — BP 129/74 | HR 89

## 2023-05-21 DIAGNOSIS — R3129 Other microscopic hematuria: Secondary | ICD-10-CM | POA: Diagnosis not present

## 2023-05-21 LAB — URINALYSIS, ROUTINE W REFLEX MICROSCOPIC
Bilirubin, UA: NEGATIVE
Glucose, UA: NEGATIVE
Ketones, UA: NEGATIVE
Leukocytes,UA: NEGATIVE
Nitrite, UA: NEGATIVE
Protein,UA: NEGATIVE
RBC, UA: NEGATIVE
Specific Gravity, UA: <1.005 — ABNORMAL LOW (ref 1.005–1.030)
Urobilinogen, Ur: 0.2 mg/dL (ref 0.2–1.0)
pH, UA: 6 (ref 5.0–7.5)

## 2023-05-21 MED ORDER — CIPROFLOXACIN HCL 500 MG PO TABS
500.0000 mg | ORAL_TABLET | Freq: Once | ORAL | Status: AC
  Administered 2023-05-21: 500 mg via ORAL

## 2023-05-21 MED ORDER — GEMTESA 75 MG PO TABS: 1.0000 | ORAL_TABLET | Freq: Every day | ORAL | Status: DC

## 2023-05-29 ENCOUNTER — Encounter: Payer: Self-pay | Admitting: Urology

## 2023-05-29 NOTE — Progress Notes (Signed)
   05/21/2023  [id number]   HPI: [name] is a 57yo here for cystoscopy for microhematuria Blood pressure 129/74, pulse 89. NED. A&Ox3.   No respiratory distress   Abd soft, NT, ND Normal external genitalia with patent urethral meatus  Cystoscopy Procedure Note  Patient identification was confirmed, informed consent was obtained, and patient was prepped using Betadine solution.  Lidocaine jelly was administered per urethral meatus.    Procedure: - Flexible cystoscope introduced, without any difficulty.   - Thorough search of the bladder revealed:    normal urethral meatus    normal urothelium    no stones    no ulcers     no tumors    no urethral polyps    no trabeculation  - Ureteral orifices were normal in position and appearance.  Post-Procedure: - Patient tolerated the procedure well  Assessment/ Plan: We will trial gemtesa 75mg  daily   No follow-ups on file.  Chloe Aye, MD

## 2023-05-29 NOTE — Patient Instructions (Signed)

## 2023-06-02 ENCOUNTER — Other Ambulatory Visit: Payer: Self-pay

## 2023-06-03 ENCOUNTER — Other Ambulatory Visit: Payer: Self-pay | Admitting: Family Medicine

## 2023-06-03 ENCOUNTER — Other Ambulatory Visit: Payer: Self-pay

## 2023-06-04 ENCOUNTER — Other Ambulatory Visit: Payer: Self-pay

## 2023-06-04 MED ORDER — OMEPRAZOLE 20 MG PO CPDR
20.0000 mg | DELAYED_RELEASE_CAPSULE | Freq: Two times a day (BID) | ORAL | 0 refills | Status: DC
  Filled 2023-06-04: qty 28, 14d supply, fill #0

## 2023-06-04 NOTE — Telephone Encounter (Signed)
 Requested Prescriptions  Pending Prescriptions Disp Refills   omeprazole (PRILOSEC) 20 MG capsule 28 capsule 0    Sig: Take 1 capsule (20 mg total) by mouth 2 (two) times daily before a meal.     Gastroenterology: Proton Pump Inhibitors Passed - 06/04/2023 10:02 AM      Passed - Valid encounter within last 12 months    Recent Outpatient Visits           3 months ago Chronic obstructive pulmonary disease with acute exacerbation (HCC)   [facility] Dept Of [place]. [facility] Joaquin Mulberry, MD   4 months ago Chronic obstructive pulmonary disease with acute exacerbation [facility])   [facility] - A Dept Of [place]. [facility] Collins Dean, NP   5 months ago Cellulitis of abdominal wall   [facility] Dept Of [place]. [facility] Collins Dean, NP   9 months ago COPD with chronic bronchitis   [facility] Dept Of [place]. [facility] Vernell Goldsmith, MD   1 year ago Bronchitis   [facility] Dept Of [place]. [facility] Vernell Goldsmith, MD       Future Appointments             In 6 days Joaquin Mulberry, MD [facility] - A Dept Of [place]. [facility]   In 5 months Rondall Codding, Dyane Glance, FNP [facility] [place]

## 2023-06-05 ENCOUNTER — Encounter (HOSPITAL_COMMUNITY): Payer: Self-pay | Admitting: Psychiatry

## 2023-06-05 ENCOUNTER — Ambulatory Visit (INDEPENDENT_AMBULATORY_CARE_PROVIDER_SITE_OTHER): Payer: MEDICAID | Admitting: Psychiatry

## 2023-06-05 ENCOUNTER — Other Ambulatory Visit: Payer: Self-pay

## 2023-06-05 DIAGNOSIS — F315 Bipolar disorder, current episode depressed, severe, with psychotic features: Secondary | ICD-10-CM

## 2023-06-05 DIAGNOSIS — F17200 Nicotine dependence, unspecified, uncomplicated: Secondary | ICD-10-CM | POA: Diagnosis not present

## 2023-06-05 MED ORDER — DIVALPROEX SODIUM 500 MG PO DR TAB
500.0000 mg | DELAYED_RELEASE_TABLET | Freq: Two times a day (BID) | ORAL | 3 refills | Status: DC
Start: 2023-06-05 — End: 2023-07-31
  Filled 2023-06-05 – 2023-07-07 (×2): qty 60, 30d supply, fill #0

## 2023-06-05 MED ORDER — NICOTINE 14 MG/24HR TD PT24
14.0000 mg | MEDICATED_PATCH | Freq: Every day | TRANSDERMAL | 3 refills | Status: DC
  Filled 2023-06-05: qty 28, 28d supply, fill #0

## 2023-06-05 MED ORDER — PALIPERIDONE ER 3 MG PO TB24
3.0000 mg | ORAL_TABLET | Freq: Every day | ORAL | 3 refills | Status: DC
  Filled 2023-07-07: qty 30, 30d supply, fill #0

## 2023-06-05 NOTE — Progress Notes (Signed)
 BH MD/PA/NP OP Progress Note       06/05/2023 1:21 PM Chloe [name]  MRN:  [id number]  Chief Complaint: "I am better than I was" Chief Complaint   Medication Management      HPI: 58 year old female seen today for follow-up psychiatric evaluation.  She has a psychiatric history of bipolar affective disorder and tobacco dependence. She is currently managed on Depakote 500 mg twice daily, hydroxyzine 10 mg 3 times daily, Nicoderm CQ 14 mg patches, and Invega 3 mg daily.  She reports her medications are effective in managing her psychiatric condition.  Today she was well groomed, pleasant, cooperative, and engaged in conversation.  Patient informed Clinical research associate that she is better than she once was. She notes that she now has a job working for [employer] as a Conservation officer, nature. 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 Patient informed Clinical research associate that she continues to live at a church but reports that she will have to leave by the end of the month. She reports that [facility] Patient also notes that her brother is considering allowing her to come back to live with he and his wife. She however notes that this may not happen as she and her sister in law had issues in the past. She reports that she was offered a room with one of her peers at the church but notes that she does not feel it will work out.   Mentally she notes that her thought process is clear. She also notes that her depression and sleep has improved. She continues to be somewhat anxious.  Today provider conducted a GAD-7 and patient scored a 12, at her last visit she scored a 12.  Provider also conducted PHQ-9 and patient scored a 3, at her last visit she scored a 9.  At times she notes that she is tired because she has been walking a lot. She notes that her bother is considering buying her a car. She endorses adequate sleep and appetite.  Today she denies SI/HI/AVH, mania,  paranoia.   Patient reports that she continues to smoke a pack of cigarettes a day. She notes that she does not use her nicorette patch. At this time no medication changes made today.  Patient agreeable to continue medications as prescribed.  No other concerns noted at this time.      Visit Diagnosis:    ICD-10-CM   1. Bipolar affective disorder, depressed, severe, with psychotic behavior (HCC)  F31.5 divalproex (DEPAKOTE) 500 MG DR tablet    paliperidone (INVEGA) 3 MG 24 hr tablet    2. Tobacco dependency  F17.200 nicotine (NICODERM CQ - DOSED IN MG/24 HOURS) 14 mg/24hr patch         Past Psychiatric History: bipolar affective disorder and tobacco dependence, several psychiatric hospitalizations in the past. She was last admitted 25 November 2017 and was noted to have psychotic symptoms along with manic features during that hospital stay. She was discharged on risperidone, carbamazepine and trazodone at that time.  Past Medical History:  Past Medical History:  Diagnosis Date   Anxiety    Bipolar disorder (HCC)    Breast mass, right    GERD (gastroesophageal reflux disease)    Hyperlipidemia    Post-operative nausea and vomiting    Smoker     Past Surgical History:  Procedure Laterality Date   ABDOMINAL HYSTERECTOMY     had nausea and vomiting post-op  BREAST LUMPECTOMY WITH RADIOACTIVE SEED LOCALIZATION Right 11/12/2016   Procedure: RIGHT BREAST LUMPECTOMY WITH RADIOACTIVE SEED LOCALIZATION;  Surgeon: Sim Dryer, MD;  Location: [facility];  Service: General;  Laterality: Right;   CHOLECYSTECTOMY     LAPAROSCOPIC LYSIS OF ADHESIONS     MYOMECTOMY     PLANTAR FASCIA SURGERY Left     Family Psychiatric History: Unknown  Family History:  Family History  Problem Relation Age of Onset   Lung cancer Mother    Breast cancer Maternal Aunt    Hyperlipidemia Brother    Colon cancer Neg Hx    Esophageal cancer Neg Hx    Stomach cancer Neg Hx     Social  History:  Social History   Socioeconomic History   Marital status: Divorced    Spouse name: Not on [name]   Number of children: Not on [name]   Years of education: Not on [name]   Highest education level: Associate degree: occupational, Scientist, product/process development, or vocational program  Occupational History   Not on [name]  Tobacco Use   Smoking status: Every Day    Current packs/day: 0.50    Types: Cigarettes   Smokeless tobacco: Never  Vaping Use   Vaping status: Never Used  Substance and Sexual Activity   Alcohol use: Not Currently    Comment: social   Drug use: No   Sexual activity: Not Currently  Other Topics Concern   Not on [name]  Social History Narrative   Not on [name]   Social Drivers of Health   Financial Resource Strain: High Risk (03/26/2022)   Overall Financial Resource Strain (CARDIA)    Difficulty of Paying Living Expenses: Hard  Food Insecurity: No Food Insecurity (01/09/2023)   Hunger Vital Sign    Worried About Running Out of Food in the Last Year: Never true    Ran Out of Food in the Last Year: Never true  Transportation Needs: No Transportation Needs (01/09/2023)   PRAPARE - Administrator, Civil Service (Medical): No    Lack of Transportation (Non-Medical): No  Physical Activity: Inactive (03/26/2022)   Exercise Vital Sign    Days of Exercise per Week: 0 days    Minutes of Exercise per Session: 0 min  Stress: Stress Concern Present (03/26/2022)   [employer] of Occupational Health - Occupational Stress Questionnaire    Feeling of Stress : Very much  Social Connections: Socially Isolated (03/26/2022)   Social Connection and Isolation Panel [NHANES]    Frequency of Communication with Friends and Family: Twice a week    Frequency of Social Gatherings with Friends and Family: Once a week    Attends Religious Services: Never    Database administrator or Organizations: No    Attends Banker Meetings: Never    Marital Status: Divorced    Allergies:   Allergies  Allergen Reactions   Penicillins Hives   Demerol [Meperidine]     Metabolic Disorder Labs: Lab Results  Component Value Date   HGBA1C 5.4 10/01/2022   MPG 108 10/01/2022   MPG 102.54 11/30/2017   Lab Results  Component Value Date   PROLACTIN 12.4 11/30/2017   Lab Results  Component Value Date   CHOL 163 10/01/2022   TRIG 96 10/01/2022   HDL 51 10/01/2022   CHOLHDL 3.2 10/01/2022   VLDL 19 10/01/2022   LDLCALC 93 10/01/2022   LDLCALC 86 04/04/2022   Lab Results  Component Value Date   TSH 1.380  10/01/2022   TSH 0.840 07/01/2022    Therapeutic Level Labs: No results found for: "LITHIUM" No results found for: "VALPROATE" Lab Results  Component Value Date   CBMZ 8.3 11/30/2017    Current Medications: Current Outpatient Medications  Medication Sig Dispense Refill   acetaminophen (TYLENOL) 500 MG tablet Take 500-1,000 mg by mouth every 6 (six) hours as needed for moderate pain (pain score 4-6). (Patient not taking: Reported on 05/21/2023)     albuterol (PROVENTIL) (2.5 MG/3ML) 0.083% nebulizer solution Take 3 mLs (2.5 mg total) by nebulization every 6 (six) hours as needed for wheezing or shortness of breath. (Patient not taking: Reported on 05/21/2023) 180 mL 1   albuterol (VENTOLIN HFA) 108 (90 Base) MCG/ACT inhaler Inhale 2 puffs into the lungs every 6 (six) hours as needed for wheezing or shortness of breath. (Patient not taking: Reported on 05/21/2023) 18 g 1   divalproex (DEPAKOTE) 500 MG DR tablet Take 1 tablet (500 mg total) by mouth every 12 (twelve) hours. 60 tablet 3   Fluticasone-Umeclidin-Vilant (TRELEGY ELLIPTA) 100-62.5-25 MCG/ACT AEPB Inhale 1 puff into the lungs daily. 60 each 6   mupirocin ointment (BACTROBAN) 2 % Apply 1 Application topically 2 (two) times daily. To affected areas (Patient not taking: Reported on 05/21/2023) 22 g 0   nicotine (NICODERM CQ - DOSED IN MG/24 HOURS) 14 mg/24hr patch Place 1 patch (14 mg total) onto the skin daily. 28  patch 3   omeprazole (PRILOSEC) 20 MG capsule Take 1 capsule (20 mg total) by mouth 2 (two) times daily before a meal. 28 capsule 0   ondansetron (ZOFRAN) 4 MG tablet Take 1 tablet (4 mg total) by mouth every 6 (six) hours. 12 tablet 0   paliperidone (INVEGA) 3 MG 24 hr tablet Take 1 tablet (3 mg total) by mouth daily. 30 tablet 3   pravastatin (PRAVACHOL) 40 MG tablet Take 1 tablet (40 mg total) by mouth daily. 90 tablet 1   tolterodine (DETROL LA) 2 MG 24 hr capsule Take 1 capsule (2 mg total) by mouth daily. 30 capsule 11   Vibegron (GEMTESA) 75 MG TABS Take 1 tablet (75 mg total) by mouth daily.     No current facility-administered medications for this visit.     Musculoskeletal: Strength & Muscle Tone: within normal limits,  Gait & Station: normal,  Patient leans: N/A  Psychiatric Specialty Exam: Review of Systems  Blood pressure 117/77, pulse (!) 101, height 5\' 1"  (1.549 m), weight 169 lb (76.7 kg), SpO2 100%.Body mass index is 31.93 kg/m.  General Appearance: Well Groomed  Eye Contact:  Good  Speech:  Clear and Coherent and Pressured  Volume:  Normal  Mood:  Anxious   Affect:  Appropriate and Congruent  Thought Process:  Coherent, Goal Directed, and Linear  Orientation:  Full (Time, Place, and Person)  Thought Content: WDL and Logical   Suicidal Thoughts:  No  Homicidal Thoughts:  No  Memory:  Immediate;   Good Recent;   Good Remote;   Good  Judgement:  Good  Insight:  Good  Psychomotor Activity:  Normal  Concentration:  Concentration: Good and Attention Span: Good  Recall:  Good  Fund of Knowledge: Good  Language: Good  Akathisia:  No  Handed:  Right  AIMS (if indicated): not done  Assets:  Communication Skills Desire for Improvement Housing Leisure Time Physical Health Social Support  ADL's:  Intact  Cognition: WNL  Sleep:  Good   Screenings: AUDIT    Flowsheet  Row Admission (Discharged) from 09/28/2022 in [facility] Ad[facility]  [facility]) [facility]  Alcohol Use Disorder Identification Test Final Score (AUDIT) 0 0      GAD-7    Flowsheet Row Clinical Support from 06/05/2023 in [facility] Clinical Support from 03/17/2023 in [facility] Office Visit from 12/10/2022 in [facility] [place] - [facility] Of [place]. [facility] Clinical Support from 11/14/2022 in [facility] Office Visit from [facility]20[facility]  Total GAD-7 Score 12 12 15 16 19       PHQ2-9    Flowsheet Row Clinical Support from 06/05/2023 in [facility] Clinical Support from 03/17/2023 in [facility] Office Visit from 12/10/2022 in [facility] [place] - A Dept Of [place] Clinical Support from 11/14/2022 in [facility] Office Visit from 07/01/2022 in [facility]  PHQ-2 Total Score 1 2 2 6 4   PHQ-9 Total Score 3 9 14 14 21       Flowsheet Row ED from 02/24/2023 in [facility] Emergency Department at [facility] Clinical Support from 11/14/2022 in [facility] ED from 11/04/2022 in [facility] RISK CATEGORY No Risk Error: Q2 is Yes, you must answer 3, 4, and 5 No Risk        Assessment and Plan: Patient reports that she has mild anxiety but notes that her depression and sleep has improved. Patient reports that she continues to smoke a pack of cigarettes a day. She notes that she does not use her nicorette patch. At this time no medication changes made today.  Patient agreeable to continue medications as prescribed  1. Bipolar affective disorder, depressed, severe, with psychotic behavior (HCC)  Continue-  paliperidone (INVEGA) 3 MG 24 hr tablet; Take 1 tablet (3 mg total) by mouth daily.  Dispense: 30 tablet; Refill: 3 Continue- divalproex (DEPAKOTE) 500 MG DR tablet; Take 1 tablet (500 mg total) by mouth every 12 (twelve) hours.  Dispense: 60 tablet; Refill: 3  2. Tobacco dependency (Primary)  Continue- nicotine (NICODERM CQ - DOSED IN MG/24 HOURS) 14 mg/24hr patch; Place 1 patch (14 mg total) onto the skin daily.  Dispense: 28 patch; Refill: 3     Follow-up in 2.71-month Follow-up with therapy at [facility], NP 06/05/2023, 1:21 PM

## 2023-06-10 ENCOUNTER — Encounter: Payer: Self-pay | Admitting: Family Medicine

## 2023-06-10 ENCOUNTER — Ambulatory Visit: Payer: MEDICAID | Attending: Family Medicine | Admitting: Family Medicine

## 2023-06-10 ENCOUNTER — Other Ambulatory Visit: Payer: Self-pay

## 2023-06-10 VITALS — BP 121/79 | HR 89 | Ht 61.0 in | Wt 171.2 lb

## 2023-06-10 DIAGNOSIS — Z5901 Sheltered homelessness: Secondary | ICD-10-CM

## 2023-06-10 DIAGNOSIS — N3281 Overactive bladder: Secondary | ICD-10-CM | POA: Diagnosis not present

## 2023-06-10 DIAGNOSIS — F315 Bipolar disorder, current episode depressed, severe, with psychotic features: Secondary | ICD-10-CM

## 2023-06-10 DIAGNOSIS — E782 Mixed hyperlipidemia: Secondary | ICD-10-CM | POA: Diagnosis not present

## 2023-06-10 DIAGNOSIS — J441 Chronic obstructive pulmonary disease with (acute) exacerbation: Secondary | ICD-10-CM

## 2023-06-10 MED ORDER — PRAVASTATIN SODIUM 40 MG PO TABS
40.0000 mg | ORAL_TABLET | Freq: Every day | ORAL | 1 refills | Status: DC
  Filled 2023-06-10 – 2023-08-12 (×2): qty 90, 90d supply, fill #0
  Filled 2023-09-13 – 2023-12-11 (×2): qty 90, 90d supply, fill #1

## 2023-06-10 MED ORDER — TRELEGY ELLIPTA 100-62.5-25 MCG/ACT IN AEPB
1.0000 | INHALATION_SPRAY | Freq: Every day | RESPIRATORY_TRACT | 6 refills | Status: AC
  Filled 2023-06-10 – 2023-07-07 (×2): qty 60, 30d supply, fill #0
  Filled 2023-08-12: qty 60, 30d supply, fill #1
  Filled 2023-09-16: qty 60, 30d supply, fill #2
  Filled 2023-11-12: qty 60, 30d supply, fill #3
  Filled 2023-12-11: qty 60, 30d supply, fill #4
  Filled 2024-02-01: qty 60, 30d supply, fill #5
  Filled 2024-03-09: qty 60, 30d supply, fill #6

## 2023-06-10 MED ORDER — ALBUTEROL SULFATE (2.5 MG/3ML) 0.083% IN NEBU
2.5000 mg | INHALATION_SOLUTION | Freq: Four times a day (QID) | RESPIRATORY_TRACT | 1 refills | Status: DC | PRN
  Filled 2023-06-10: qty 180, 15d supply, fill #0

## 2023-06-10 NOTE — Progress Notes (Signed)
 Subjective:  Patient ID: Chloe [name], female    DOB: 08/13/1965  Age: 58 y.o. MRN: [id number]  CC: Medical Management of Chronic Issues     Discussed the use of AI scribe software for clinical note transcription with the patient, who gave verbal consent to proceed.  History of Present Illness The patient, with a history of bipolar disorder (managed by [facility]), COPD, GERD, hyperlipidemia, nicotine  dependence(1 ppd x40), overactive bladder  presents for follow-up.  She reports significant improvement in her COPD symptoms since switching from Advair  to Trelegy. She has not needed to use her albuterol  rescue inhaler. She continues to smoke, but has cut back to half a pack per day. She has been exercising, but has gained weight.  The patient is currently homeless and is struggling with housing and financial issues. 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 She is awaiting approval for disability benefits.  The patient also reports urinary urgency and incontinence, for which she has been seeing a urologist. She was initially prescribed tolterodine , but due to persistent symptoms, the urologist provided samples of a new medication to try once she finishes her current supply of tolterodine . She is currently under the care of behavioral health for management of her bipolar disorder.   Past Medical History:  Diagnosis Date   Anxiety    Bipolar disorder (HCC)    Breast mass, right    GERD (gastroesophageal reflux disease)    Hyperlipidemia    Post-operative nausea and vomiting    Smoker     Past Surgical History:  Procedure Laterality Date   ABDOMINAL HYSTERECTOMY     had nausea and vomiting post-op   BREAST LUMPECTOMY WITH RADIOACTIVE SEED LOCALIZATION Right 11/12/2016   Procedure: RIGHT BREAST LUMPECTOMY WITH RADIOACTIVE SEED LOCALIZATION;  Surgeon: Sim Dryer, MD;  Location: [facility];  Service: General;  Laterality: Right;   CHOLECYSTECTOMY     LAPAROSCOPIC LYSIS OF ADHESIONS     MYOMECTOMY     PLANTAR FASCIA SURGERY Left     Family History  Problem Relation Age of Onset   Lung cancer Mother    Breast cancer Maternal Aunt    Hyperlipidemia Brother    Colon cancer Neg Hx    Esophageal cancer Neg Hx    Stomach cancer Neg Hx     Social History   Socioeconomic History   Marital status: Divorced    Spouse name: Not on file   Number of children: Not on file   Years of education: Not on file   Highest education level: Associate degree: occupational, Scientist, product/process development, or vocational program  Occupational History   Not on file  Tobacco Use   Smoking status: Every Day    Current packs/day: 0.50    Types: Cigarettes   Smokeless tobacco: Never  Vaping Use   Vaping status: Never Used  Substance and Sexual Activity   Alcohol use: Not Currently    Comment: social   Drug use: No   Sexual activity: Not Currently  Other Topics Concern   Not on file  Social History Narrative   Not on file   Social Drivers of Health   Financial Resource Strain: High Risk (03/26/2022)   Overall Financial Resource Strain (CARDIA)    Difficulty of Paying Living Expenses: Hard  Food Insecurity: No Food Insecurity (01/09/2023)   Hunger Vital Sign    Worried About Running Out of Food  in the Last Year: Never true    Ran Out of Food in the Last Year: Never true  Transportation Needs: No Transportation Needs (01/09/2023)   PRAPARE - Administrator, Civil Service (Medical): No    Lack of Transportation (Non-Medical): No  Physical Activity: Inactive (03/26/2022)   Exercise Vital Sign    Days of Exercise per Week: 0 days    Minutes of Exercise per Session: 0 min  Stress: Stress Concern Present (03/26/2022)   [employer] of Occupational Health - Occupational Stress Questionnaire    Feeling of Stress : Very much  Social Connections: Socially Isolated (03/26/2022)    Social Connection and Isolation Panel [NHANES]    Frequency of Communication with Friends and Family: Twice a week    Frequency of Social Gatherings with Friends and Family: Once a week    Attends Religious Services: Never    Database administrator or Organizations: No    Attends Engineer, structural: Never    Marital Status: Divorced    Allergies  Allergen Reactions   Penicillins Hives   Demerol  [Meperidine ]     Outpatient Medications Prior to Visit  Medication Sig Dispense Refill   acetaminophen  (TYLENOL ) 500 MG tablet Take 500-1,000 mg by mouth every 6 (six) hours as needed for moderate pain (pain score 4-6).     albuterol  (VENTOLIN  HFA) 108 (90 Base) MCG/ACT inhaler Inhale 2 puffs into the lungs every 6 (six) hours as needed for wheezing or shortness of breath. 18 g 1   divalproex  (DEPAKOTE ) 500 MG DR tablet Take 1 tablet (500 mg total) by mouth every 12 (twelve) hours. 60 tablet 3   nicotine  (NICODERM CQ  - DOSED IN MG/24 HOURS) 14 mg/24hr patch Place 1 patch (14 mg total) onto the skin daily. 28 patch 3   omeprazole  (PRILOSEC) 20 MG capsule Take 1 capsule (20 mg total) by mouth 2 (two) times daily before a meal. 28 capsule 0   ondansetron  (ZOFRAN ) 4 MG tablet Take 1 tablet (4 mg total) by mouth every 6 (six) hours. 12 tablet 0   paliperidone  (INVEGA ) 3 MG 24 hr tablet Take 1 tablet (3 mg total) by mouth daily. 30 tablet 3   tolterodine  (DETROL  LA) 2 MG 24 hr capsule Take 1 capsule (2 mg total) by mouth daily. 30 capsule 11   Vibegron  (GEMTESA ) 75 MG TABS Take 1 tablet (75 mg total) by mouth daily.     albuterol  (PROVENTIL ) (2.5 MG/3ML) 0.083% nebulizer solution Take 3 mLs (2.5 mg total) by nebulization every 6 (six) hours as needed for wheezing or shortness of breath. 180 mL 1   Fluticasone -Umeclidin-Vilant (TRELEGY ELLIPTA ) 100-62.5-25 MCG/ACT AEPB Inhale 1 puff into the lungs daily. 60 each 6   pravastatin  (PRAVACHOL ) 40 MG tablet Take 1 tablet (40 mg total) by mouth  daily. 90 tablet 1   mupirocin  ointment (BACTROBAN ) 2 % Apply 1 Application topically 2 (two) times daily. To affected areas (Patient not taking: Reported on 05/21/2023) 22 g 0   No facility-administered medications prior to visit.     ROS Review of Systems  Constitutional:  Negative for activity change and appetite change.  HENT:  Negative for sinus pressure and sore throat.   Respiratory:  Negative for chest tightness, shortness of breath and wheezing.   Cardiovascular:  Negative for chest pain and palpitations.  Gastrointestinal:  Negative for abdominal distention, abdominal pain and constipation.  Genitourinary: Negative.   Musculoskeletal: Negative.   Psychiatric/Behavioral:  Negative for  behavioral problems and dysphoric mood.     Objective:  BP 121/79   Pulse 89   Ht 5\' 1"  (1.549 m)   Wt 171 lb 3.2 oz (77.7 kg)   SpO2 95%   BMI 32.35 kg/m      06/10/2023    4:16 PM 06/05/2023   11:06 AM 05/21/2023   11:47 AM  BP/Weight  Systolic BP 121  161  Diastolic BP 79  74  Wt. (Lbs) 171.2    BMI 32.35 kg/m2       Information is confidential and restricted. Go to Review Flowsheets to unlock data.      Physical Exam Constitutional:      Appearance: She is well-developed.  Cardiovascular:     Rate and Rhythm: Normal rate.     Heart sounds: Normal heart sounds. No murmur heard. Pulmonary:     Effort: Pulmonary effort is normal.     Breath sounds: Normal breath sounds. No wheezing or rales.  Chest:     Chest wall: No tenderness.  Abdominal:     General: Bowel sounds are normal. There is no distension.     Palpations: Abdomen is soft. There is no mass.     Tenderness: There is no abdominal tenderness.  Musculoskeletal:        General: Normal range of motion.     Right lower leg: No edema.     Left lower leg: No edema.  Neurological:     Mental Status: She is alert and oriented to person, place, and time.  Psychiatric:        Mood and Affect: Mood normal.         Latest Ref Rng & Units 02/24/2023    8:10 AM 01/10/2023    5:40 AM 01/08/2023    5:27 PM  CMP  Glucose 70 - 99 mg/dL 096  83  045   BUN 6 - 20 mg/dL 17  15  12    Creatinine 0.44 - 1.00 mg/dL 4.09  8.11  9.14   Sodium 135 - 145 mmol/L 137  138  138   Potassium 3.5 - 5.1 mmol/L 4.1  3.6  3.4   Chloride 98 - 111 mmol/L 106  104  99   CO2 22 - 32 mmol/L 18  25  29    Calcium 8.9 - 10.3 mg/dL 8.6  8.9  9.7   Total Protein 6.5 - 8.1 g/dL 7.6  6.6  7.6   Total Bilirubin 0.0 - 1.2 mg/dL 0.5  0.5  0.3   Alkaline Phos 38 - 126 U/L 83  70  76   AST 15 - 41 U/L 29  15  15    ALT 0 - 44 U/L 24  17  17      Lipid Panel     Component Value Date/Time   CHOL 163 10/01/2022 0710   CHOL 151 04/04/2022 1033   TRIG 96 10/01/2022 0710   HDL 51 10/01/2022 0710   HDL 44 04/04/2022 1033   CHOLHDL 3.2 10/01/2022 0710   VLDL 19 10/01/2022 0710   LDLCALC 93 10/01/2022 0710   LDLCALC 86 04/04/2022 1033    CBC    Component Value Date/Time   WBC 11.3 (H) 02/24/2023 0810   RBC 5.61 (H) 02/24/2023 0810   HGB 17.5 (H) 02/24/2023 0810   HGB 14.8 07/01/2022 1619   HCT 51.0 (H) 02/24/2023 0810   HCT 44.0 07/01/2022 1619   PLT 293 02/24/2023 0810   PLT 359 07/01/2022 1619  MCV 90.9 02/24/2023 0810   MCV 92 07/01/2022 1619   MCH 31.2 02/24/2023 0810   MCHC 34.3 02/24/2023 0810   RDW 13.1 02/24/2023 0810   RDW 12.6 07/01/2022 1619   LYMPHSABS 3.4 01/08/2023 1727   LYMPHSABS 3.2 (H) 07/01/2022 1619   MONOABS 0.7 01/08/2023 1727   EOSABS 0.3 01/08/2023 1727   EOSABS 0.4 07/01/2022 1619   BASOSABS 0.1 01/08/2023 1727   BASOSABS 0.1 07/01/2022 1619    Lab Results  Component Value Date   HGBA1C 5.4 10/01/2022       Assessment & Plan Chronic Obstructive Pulmonary Disease (COPD) Symptoms improved with Trelegy. No recent need for albuterol . - Continue Trelegy. - Use albuterol  as needed for rescue. - Smoking cessation will be beneficial  Tobacco Use Reduced smoking by half a pack. Using  nicotine  patches and has Chantix available. Annual CT scan for lung cancer screening was completed in 03/2023. - Continue nicotine  patches and Chantix as needed. - Encourage continued efforts to quit smoking. - Schedule annual CT scan for lung cancer screening next year.  Overactive bladder Significant urgency and accidents. Transitioning from tolterodine  to Vibegron  prescribed by urologist. Waynetta Hair current tolterodine  prescription. - Start new medication as prescribed by urologist after finishing tolterodine . - Follow up with urologist to discuss effectiveness of new medication and adjust treatment as necessary.  Hyperlipidemia Managed with pravastatin . - Continue pravastatin .  Gastroesophageal Reflux Disease (GERD) Continues to use Prilosec. Considering over-the-counter options. - Continue Prilosec as needed. - Consider over-the-counter options if Prilosec is not needed.   Bipolar disorder - Management as per [facility] behavioral health   Meds ordered this encounter  Medications   albuterol  (PROVENTIL ) (2.5 MG/3ML) 0.083% nebulizer solution    Sig: Take 3 mLs (2.5 mg total) by nebulization every 6 (six) hours as needed for wheezing or shortness of breath.    Dispense:  180 mL    Refill:  1   Fluticasone -Umeclidin-Vilant (TRELEGY ELLIPTA ) 100-62.5-25 MCG/ACT AEPB    Sig: Inhale 1 puff into the lungs daily.    Dispense:  60 each    Refill:  6   pravastatin  (PRAVACHOL ) 40 MG tablet    Sig: Take 1 tablet (40 mg total) by mouth daily.    Dispense:  90 tablet    Refill:  1    Follow-up: Return in about 6 months (around 12/10/2023) for Chronic medical conditions.       Joaquin Mulberry, MD, FAAFP. [facility] [place], [place] [phone]   06/10/2023, 5:00 PM

## 2023-06-10 NOTE — Patient Instructions (Signed)
 VISIT SUMMARY:  During today's visit, we discussed your COPD, smoking habits, urinary incontinence, hyperlipidemia, and GERD. You reported significant improvement in your COPD symptoms since switching to Trelegy and have reduced your smoking. We also reviewed your current medications and made plans for your ongoing care.  YOUR PLAN:  -CHRONIC OBSTRUCTIVE PULMONARY DISEASE (COPD): COPD is a chronic lung condition that makes it hard to breathe. Your symptoms have improved with Trelegy, and you haven't needed your albuterol  rescue inhaler. Continue using Trelegy daily and use albuterol  only if needed for sudden symptoms.  -TOBACCO USE: Smoking can worsen your COPD and overall health. You've reduced your smoking to half a pack per day and are using nicotine  patches and have Chantix available. Keep using these aids to help quit smoking completely. Schedule your annual CT scan for lung cancer screening next year.  -URINARY INCONTINENCE: Urinary incontinence is the loss of bladder control. You are transitioning from tolterodine  to a new medication prescribed by your urologist. Waynetta Hair your current tolterodine  prescription and then start the new medication. Follow up with your urologist to discuss how the new medication is working.  -HYPERLIPIDEMIA: Hyperlipidemia means you have high levels of fats in your blood, which can increase your risk of heart disease. Continue taking pravastatin  as prescribed to manage your cholesterol levels.  -GASTROESOPHAGEAL REFLUX DISEASE (GERD): GERD is a condition where stomach acid frequently flows back into the tube connecting your mouth and stomach, causing discomfort. Continue using Prilosec as needed, and consider over-the-counter options if you find you don't need Prilosec regularly.  INSTRUCTIONS:  Please schedule your annual CT scan for lung cancer screening next year. Follow up with your urologist to discuss the effectiveness of your new medication for urinary  incontinence and adjust treatment as necessary.

## 2023-06-11 ENCOUNTER — Other Ambulatory Visit: Payer: Self-pay

## 2023-06-16 ENCOUNTER — Other Ambulatory Visit (HOSPITAL_COMMUNITY): Payer: MEDICAID

## 2023-06-16 ENCOUNTER — Other Ambulatory Visit (HOSPITAL_COMMUNITY): Payer: Self-pay | Admitting: Psychiatry

## 2023-06-16 DIAGNOSIS — F319 Bipolar disorder, unspecified: Secondary | ICD-10-CM

## 2023-06-16 DIAGNOSIS — F315 Bipolar disorder, current episode depressed, severe, with psychotic features: Secondary | ICD-10-CM

## 2023-06-16 DIAGNOSIS — Z79899 Other long term (current) drug therapy: Secondary | ICD-10-CM | POA: Diagnosis not present

## 2023-06-16 NOTE — Progress Notes (Signed)
 Pt tolerated labs well in right arm with no complaints.    JNL, CMA

## 2023-06-17 LAB — THYROID PANEL WITH TSH
Free Thyroxine Index: 2.7 (ref 1.2–4.9)
T3 Uptake Ratio: 26 % (ref 24–39)
T4, Total: 10.4 ug/dL (ref 4.5–12.0)
TSH: 1.42 u[IU]/mL (ref 0.450–4.500)

## 2023-06-17 LAB — COMPREHENSIVE METABOLIC PANEL WITH GFR
ALT: 38 IU/L — ABNORMAL HIGH (ref 0–32)
AST: 31 IU/L (ref 0–40)
Albumin: 4.2 g/dL (ref 3.8–4.9)
Alkaline Phosphatase: 130 IU/L — ABNORMAL HIGH (ref 44–121)
BUN/Creatinine Ratio: 12 (ref 9–23)
BUN: 6 mg/dL (ref 6–24)
Bilirubin Total: 0.3 mg/dL (ref 0.0–1.2)
CO2: 22 mmol/L (ref 20–29)
Calcium: 9.6 mg/dL (ref 8.7–10.2)
Chloride: 101 mmol/L (ref 96–106)
Creatinine, Ser: 0.52 mg/dL — ABNORMAL LOW (ref 0.57–1.00)
Globulin, Total: 2.8 g/dL (ref 1.5–4.5)
Glucose: 91 mg/dL (ref 70–99)
Potassium: 4.2 mmol/L (ref 3.5–5.2)
Sodium: 140 mmol/L (ref 134–144)
Total Protein: 7 g/dL (ref 6.0–8.5)
eGFR: 109 mL/min/{1.73_m2} (ref >59)

## 2023-06-17 LAB — LIPID PANEL
Chol/HDL Ratio: 4.3 ratio (ref 0.0–4.4)
Cholesterol, Total: 154 mg/dL (ref 100–199)
HDL: 36 mg/dL — ABNORMAL LOW (ref >39)
LDL Chol Calc (NIH): 89 mg/dL (ref 0–99)
Triglycerides: 165 mg/dL — ABNORMAL HIGH (ref 0–149)
VLDL Cholesterol Cal: 29 mg/dL (ref 5–40)

## 2023-06-17 LAB — HEPATIC FUNCTION PANEL: Bilirubin, Direct: 0.13 mg/dL (ref 0.00–0.40)

## 2023-06-17 LAB — HEMOGLOBIN A1C
Est. average glucose Bld gHb Est-mCnc: 114 mg/dL
Hgb A1c MFr Bld: 5.6 % (ref 4.8–5.6)

## 2023-06-17 LAB — PROLACTIN: Prolactin: 16.4 ng/mL (ref 3.6–25.2)

## 2023-06-17 LAB — VALPROIC ACID LEVEL: Valproic Acid Lvl: 48 ug/mL — ABNORMAL LOW (ref 50–100)

## 2023-06-19 ENCOUNTER — Other Ambulatory Visit: Payer: Self-pay

## 2023-06-19 NOTE — Progress Notes (Signed)
 Provider discussed patient abnormal lab values.  At this time she does not have questions or concerns.

## 2023-06-26 ENCOUNTER — Encounter: Payer: Self-pay | Admitting: Internal Medicine

## 2023-06-26 NOTE — Progress Notes (Signed)
 58 year old COPD, bipolar ,GERD, current smoker, history of onychomycosis, presents to have  her right foot check.  she has some broken nails and is concern with having fungal infection in her foot. She does have some broken nails, mild yellowish color of nails and dry skin. Antifungal ointment provided and she will follow up with her podiatry to have rest of toe nail removed. I provided counseling in regards to smoking.

## 2023-07-07 ENCOUNTER — Other Ambulatory Visit: Payer: Self-pay | Admitting: Family Medicine

## 2023-07-08 ENCOUNTER — Other Ambulatory Visit: Payer: Self-pay

## 2023-07-08 MED ORDER — OMEPRAZOLE 20 MG PO CPDR
20.0000 mg | DELAYED_RELEASE_CAPSULE | Freq: Two times a day (BID) | ORAL | 0 refills | Status: DC
  Filled 2023-07-08: qty 28, 14d supply, fill #0

## 2023-07-09 ENCOUNTER — Other Ambulatory Visit: Payer: Self-pay

## 2023-07-10 ENCOUNTER — Other Ambulatory Visit (HOSPITAL_COMMUNITY): Payer: Self-pay

## 2023-07-10 ENCOUNTER — Other Ambulatory Visit: Payer: Self-pay

## 2023-07-17 ENCOUNTER — Encounter: Payer: Self-pay | Admitting: *Deleted

## 2023-07-17 NOTE — Congregational Nurse Program (Signed)
  Dept: [phone]   Congregational Nurse Program Note  Date of Encounter: 07/17/2023  Past Medical History: Past Medical History:  Diagnosis Date   Anxiety    Bipolar disorder (HCC)    Breast mass, right    GERD (gastroesophageal reflux disease)    Hyperlipidemia    Post-operative nausea and vomiting    Smoker     Encounter Details:  Community Questionnaire - 07/17/23 1129       Questionnaire   Ask client: Do you give verbal consent for me to treat you today? Yes    Student Assistance N/A    Location Patient Served  [facility]    Encounter Setting CN site    Population Status Unhoused    Insurance Medicaid    Insurance/Financial Assistance Referral N/A    Medication N/A    Medical Provider Yes    Screening Referrals Made N/A    Medical Referrals Made [facility]    Medical Appointment Completed N/A    CNP Interventions Advocate/Support;Navigate Healthcare System    Screenings CN Performed Blood Pressure;Weight    ED Visit Averted N/A    Life-Saving Intervention Made N/A            Client came to nurses office with three spots of hard skin on her hands. She reports on/off numbness and locking of fingers and toes. Referred client to mobile clinic or [facility] clinic as client has to work around her work schedule. Client requested first to be seen at upcoming [facility] clinic on Wednesday June 4th. Completed triage form. Offered support and encouragement. Blood pressure 110/65, pulse 90, height 5\' 1"  (1.549 m), weight 170 lb 6.4 oz (77.3 kg), SpO2 92%.  Thaine Garriga W RN CN

## 2023-07-24 ENCOUNTER — Encounter: Payer: Self-pay | Admitting: Internal Medicine

## 2023-07-24 NOTE — Progress Notes (Signed)
 [facility]  58 year old PMH GERD, Current smoker, HO onychomycosis, Overreactive bladder. Presents complaining of fingers and toes BL pain and stiffness for years, worse lately. Report mild tingling and restless leg symptoms. Pain is usually worse at night. She continue to take Depakote , Invega . She also notice some spot in her palm, likely callus.  General; NAD Hands, no swelling or redness noticed.    1-Fingers and toes pain: Suspect Osteoarthritis. Will give her trial of Ibuprofen . Also in the differential neuropathy, she will discussed with her PCP about other options for Depakote  in case is causing neuropathy.  Recommend PCP to check B 12 and iron level.  2-Onychomycosis improving.  3-Counseling provided for smoking.     Danette Duos, MD

## 2023-07-30 ENCOUNTER — Ambulatory Visit: Payer: MEDICAID | Admitting: Podiatry

## 2023-07-31 ENCOUNTER — Ambulatory Visit (HOSPITAL_COMMUNITY): Payer: MEDICAID | Admitting: Psychiatry

## 2023-07-31 ENCOUNTER — Other Ambulatory Visit: Payer: Self-pay

## 2023-07-31 ENCOUNTER — Encounter (HOSPITAL_COMMUNITY): Payer: Self-pay | Admitting: Psychiatry

## 2023-07-31 VITALS — BP 133/81 | HR 88 | Temp 97.7°F | Ht 61.0 in | Wt 168.4 lb

## 2023-07-31 DIAGNOSIS — F4321 Adjustment disorder with depressed mood: Secondary | ICD-10-CM

## 2023-07-31 DIAGNOSIS — F315 Bipolar disorder, current episode depressed, severe, with psychotic features: Secondary | ICD-10-CM | POA: Diagnosis not present

## 2023-07-31 DIAGNOSIS — F17200 Nicotine dependence, unspecified, uncomplicated: Secondary | ICD-10-CM

## 2023-07-31 MED ORDER — DIVALPROEX SODIUM 500 MG PO DR TAB
500.0000 mg | DELAYED_RELEASE_TABLET | Freq: Two times a day (BID) | ORAL | 3 refills | Status: DC
  Filled 2023-07-31 – 2023-08-12 (×2): qty 60, 30d supply, fill #0
  Filled 2023-09-13: qty 60, 30d supply, fill #1
  Filled 2023-10-14: qty 60, 30d supply, fill #2

## 2023-07-31 MED ORDER — PALIPERIDONE ER 3 MG PO TB24
3.0000 mg | ORAL_TABLET | Freq: Every day | ORAL | 3 refills | Status: DC
  Filled 2023-07-31 – 2023-08-12 (×2): qty 30, 30d supply, fill #0
  Filled 2023-09-13: qty 30, 30d supply, fill #1
  Filled 2023-10-14: qty 30, 30d supply, fill #2

## 2023-07-31 MED ORDER — NICOTINE 14 MG/24HR TD PT24
14.0000 mg | MEDICATED_PATCH | Freq: Every day | TRANSDERMAL | 3 refills | Status: DC
  Filled 2023-07-31: qty 28, 28d supply, fill #0

## 2023-07-31 NOTE — Progress Notes (Signed)
 [facility] MD/PA/NP OP Progress Note       07/31/2023 3:55 PM Chloe [name]  MRN:  [id number]  Chief Complaint: My sister-in-law killed herself     HPI: 58 year old female seen today for follow-up psychiatric evaluation.  She has a psychiatric history of bipolar affective disorder and tobacco dependence. She is currently managed on Depakote  500 mg twice daily, hydroxyzine  10 mg 3 times daily, Nicoderm CQ  14 mg patches, and Invega  3 mg daily.  She reports that she discontinued hydroxyzine  but reports that her other medications are effective in managing her psychiatric conditions.  Today she was well groomed, pleasant, cooperative, and engaged in conversation.  Patient somewhat tearful throughout the exam.  She informed writer that recently her sister-in-law killed herself by shooting herself.  She informed Clinical research associate that she did not realize her sister-in-law was so depressed.  She did note that she had several physical comorbidities that made life difficult.  Patient reports that she is trying to cope and be supportive of her brother and nieces/nephew.  Patient also reports that she continues to be homeless.  She now lives at the [facility].  She is hopeful that her brother will allow her to return to the home now that his wife has passed.  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   Patient informed writer that the above worsened her anxiety and depression. Today provider conducted a GAD-7 and patient scored a 15, at her last visit she scored a 12.  Provider also conducted PHQ-9 and patient scored a 19, at her last visit she scored a 3.  Patient notes that she was getting good sleep however after the death of her sister-in-law she notes that it now fluctuates.  She does continue to have an adequate appetite.  Today she denies SI/HI/AVH, mania, paranoia.   Patient reports that  she continues to smoke a pack of cigarettes.  She notes that she has reduced her consumption.  She is trying to quit on her own but is open to restarting her Nicorette  patches.  At this time patient does not wish to continue hydroxyzine .  No other medication changes made today.  Patient agreeable to continue medication as prescribed.  She is following up with counseling at the [facility].  No other concerns noted at this time.      Visit Diagnosis:    ICD-10-CM   1. Grief  F43.21     2. Bipolar affective disorder, depressed, severe, with psychotic behavior (HCC)  F31.5 divalproex  (DEPAKOTE ) 500 MG DR tablet    paliperidone  (INVEGA ) 3 MG 24 hr tablet    3. Tobacco dependency  F17.200 nicotine  (NICODERM CQ  - DOSED IN MG/24 HOURS) 14 mg/24hr patch          Past Psychiatric History: bipolar affective disorder and tobacco dependence, several psychiatric hospitalizations in the past. She was last admitted 25 November 2017 and was noted to have psychotic symptoms along with manic features during that hospital stay. She was discharged on risperidone , carbamazepine  and trazodone  at that time.  Past Medical History:  Past Medical History:  Diagnosis Date   Anxiety    Bipolar disorder (HCC)    Breast mass, right    GERD (gastroesophageal reflux disease)    Hyperlipidemia    Post-operative nausea and vomiting    Smoker     Past Surgical  History:  Procedure Laterality Date   ABDOMINAL HYSTERECTOMY     had nausea and vomiting post-op   BREAST LUMPECTOMY WITH RADIOACTIVE SEED LOCALIZATION Right 11/12/2016   Procedure: RIGHT BREAST LUMPECTOMY WITH RADIOACTIVE SEED LOCALIZATION;  Surgeon: Sim Dryer, MD;  Location: [facility];  Service: General;  Laterality: Right;   CHOLECYSTECTOMY     LAPAROSCOPIC LYSIS OF ADHESIONS     MYOMECTOMY     PLANTAR FASCIA SURGERY Left     Family Psychiatric History: Unknown  Family History:  Family History  Problem Relation  Age of Onset   Lung cancer Mother    Breast cancer Maternal Aunt    Hyperlipidemia Brother    Colon cancer Neg Hx    Esophageal cancer Neg Hx    Stomach cancer Neg Hx     Social History:  Social History   Socioeconomic History   Marital status: Divorced    Spouse name: Not on file   Number of children: Not on file   Years of education: Not on file   Highest education level: Associate degree: occupational, Scientist, product/process development, or vocational program  Occupational History   Not on file  Tobacco Use   Smoking status: Every Day    Current packs/day: 0.50    Types: Cigarettes   Smokeless tobacco: Never  Vaping Use   Vaping status: Never Used  Substance and Sexual Activity   Alcohol use: Not Currently    Comment: social   Drug use: No   Sexual activity: Not Currently  Other Topics Concern   Not on file  Social History Narrative   Not on file   Social Drivers of Health   Financial Resource Strain: High Risk (03/26/2022)   Overall Financial Resource Strain (CARDIA)    Difficulty of Paying Living Expenses: Hard  Food Insecurity: No Food Insecurity (01/09/2023)   Hunger Vital Sign    Worried About Running Out of Food in the Last Year: Never true    Ran Out of Food in the Last Year: Never true  Transportation Needs: No Transportation Needs (01/09/2023)   PRAPARE - Administrator, Civil Service (Medical): No    Lack of Transportation (Non-Medical): No  Physical Activity: Inactive (03/26/2022)   Exercise Vital Sign    Days of Exercise per Week: 0 days    Minutes of Exercise per Session: 0 min  Stress: Stress Concern Present (03/26/2022)   [employer] of Occupational Health - Occupational Stress Questionnaire    Feeling of Stress : Very much  Social Connections: Socially Isolated (03/26/2022)   Social Connection and Isolation Panel    Frequency of Communication with Friends and Family: Twice a week    Frequency of Social Gatherings with Friends and Family: Once a week     Attends Religious Services: Never    Database administrator or Organizations: No    Attends Banker Meetings: Never    Marital Status: Divorced    Allergies:  Allergies  Allergen Reactions   Penicillins Hives   Demerol  [Meperidine ]     Metabolic Disorder Labs: Lab Results  Component Value Date   HGBA1C 5.6 06/16/2023   MPG 108 10/01/2022   MPG 102.54 11/30/2017   Lab Results  Component Value Date   PROLACTIN 16.4 06/16/2023   PROLACTIN 12.4 11/30/2017   Lab Results  Component Value Date   CHOL 154 06/16/2023   TRIG 165 (H) 06/16/2023   HDL 36 (L) 06/16/2023   CHOLHDL 4.3 06/16/2023  VLDL 19 10/01/2022   LDLCALC 89 06/16/2023   LDLCALC 93 10/01/2022   Lab Results  Component Value Date   TSH 1.420 06/16/2023   TSH 1.380 10/01/2022    Therapeutic Level Labs: No results found for: LITHIUM Lab Results  Component Value Date   VALPROATE 48 (L) 06/16/2023   Lab Results  Component Value Date   CBMZ 8.3 11/30/2017    Current Medications: Current Outpatient Medications  Medication Sig Dispense Refill   acetaminophen  (TYLENOL ) 500 MG tablet Take 500-1,000 mg by mouth every 6 (six) hours as needed for moderate pain (pain score 4-6).     albuterol  (PROVENTIL ) (2.5 MG/3ML) 0.083% nebulizer solution Take 3 mLs (2.5 mg total) by nebulization every 6 (six) hours as needed for wheezing or shortness of breath. 180 mL 1   albuterol  (VENTOLIN  HFA) 108 (90 Base) MCG/ACT inhaler Inhale 2 puffs into the lungs every 6 (six) hours as needed for wheezing or shortness of breath. 18 g 1   divalproex  (DEPAKOTE ) 500 MG DR tablet Take 1 tablet (500 mg total) by mouth every 12 (twelve) hours. 60 tablet 3   Fluticasone -Umeclidin-Vilant (TRELEGY ELLIPTA ) 100-62.5-25 MCG/ACT AEPB Inhale 1 puff into the lungs daily. 60 each 6   mupirocin  ointment (BACTROBAN ) 2 % Apply 1 Application topically 2 (two) times daily. To affected areas (Patient not taking: Reported on 05/21/2023) 22 g 0    nicotine  (NICODERM CQ  - DOSED IN MG/24 HOURS) 14 mg/24hr patch Place 1 patch (14 mg total) onto the skin daily. 28 patch 3   omeprazole  (PRILOSEC) 20 MG capsule Take 1 capsule (20 mg total) by mouth 2 (two) times daily before a meal. 28 capsule 0   ondansetron  (ZOFRAN ) 4 MG tablet Take 1 tablet (4 mg total) by mouth every 6 (six) hours. 12 tablet 0   paliperidone  (INVEGA ) 3 MG 24 hr tablet Take 1 tablet (3 mg total) by mouth daily. 30 tablet 3   pravastatin  (PRAVACHOL ) 40 MG tablet Take 1 tablet (40 mg total) by mouth daily. 90 tablet 1   tolterodine  (DETROL  LA) 2 MG 24 hr capsule Take 1 capsule (2 mg total) by mouth daily. 30 capsule 11   Vibegron  (GEMTESA ) 75 MG TABS Take 1 tablet (75 mg total) by mouth daily.     No current facility-administered medications for this visit.     Musculoskeletal: Strength & Muscle Tone: within normal limits,  Gait & Station: normal,  Patient leans: N/A  Psychiatric Specialty Exam: Review of Systems  Blood pressure 133/81, pulse 88, temperature 97.7 F (36.5 C), temperature source Oral, height 5' 1 (1.549 m), weight 168 lb 6.4 oz (76.4 kg), SpO2 100%.Body mass index is 31.82 kg/m.  General Appearance: Well Groomed  Eye Contact:  Good  Speech:  Clear and Coherent and Pressured  Volume:  Normal  Mood:  Anxious and Depressed   Affect:  Appropriate and Congruent  Thought Process:  Coherent, Goal Directed, and Linear  Orientation:  Full (Time, Place, and Person)  Thought Content: WDL and Logical   Suicidal Thoughts:  No  Homicidal Thoughts:  No  Memory:  Immediate;   Good Recent;   Good Remote;   Good  Judgement:  Good  Insight:  Good  Psychomotor Activity:  Normal  Concentration:  Concentration: Good and Attention Span: Good  Recall:  Good  Fund of Knowledge: Good  Language: Good  Akathisia:  No  Handed:  Right  AIMS (if indicated): not done  Assets:  Communication Skills Desire for  Improvement Housing Leisure Time Physical  Health Social Support  ADL's:  Intact  Cognition: WNL  Sleep:  Fair   Screenings: AUDIT    Flowsheet Row Admission (Discharged) from 09/28/2022 in [facility]  Alcohol Use Disorder Identification Test Final Score (AUDIT) 0 0   GAD-7    Flowsheet Row Clinical Support from 07/31/2023 in [facility] Office Visit from 06/10/2023 in [facility] [place] - A Dept Of [place]. [facility] Clinical Support from 06/05/2023 in [facility] Clinical Support from 03/17/2023 in [facility] Office Visit from 12/10/2022 in [facility] [place] - A Dept Of [place]. [facility]  Total GAD-7 Score 15 11 12 12 15    PHQ2-9    Flowsheet Row Clinical Support from 07/31/2023 in [facility] Office Visit from 06/10/2023 in [facility] - A Dept Of [place]. [facility] Clinical Support from 06/05/2023 in [facility] Clinical Support from 03/17/2023 in [facility] Office Visit from 12/10/2022 in [facility] [place] - A Dept Of [place]. [facility]  PHQ-2 Total Score 3 2 1 2 2   PHQ-9 Total Score 19 8 3 9 14    Flowsheet Row ED from 02/24/2023 in [facility] Emergency Department at [facility] Clinical Support from 11/14/2022 in [facility] UC from 11/04/2022 in [facility] at [facility] RISK CATEGORY No Risk Error: Q2 is Yes, you must answer 3, 4, and 5 No Risk     Assessment and Plan: Patient reports that she she grieves the loss of her sister-in-law.  She also notes that she has been more anxious and depressed since the passing of her sister-in-law.  To cope she notes that she tries to work and stay  busy.Patient reports that she continues to smoke a pack of cigarettes.  She notes that she has reduced her consumption.  She is trying to quit on her own but is open to restarting her Nicorette  patches.  At this time patient does not wish to continue hydroxyzine .  No other medication changes made today.  Patient agreeable to continue medication as prescribed.  She is following up with counseling at the [facility].  1. Bipolar affective disorder, depressed, severe, with psychotic behavior (HCC)  Continue- paliperidone  (INVEGA ) 3 MG 24 hr tablet; Take 1 tablet (3 mg total) by mouth daily.  Dispense: 30 tablet; Refill: 3 Continue- divalproex  (DEPAKOTE ) 500 MG DR tablet; Take 1 tablet (500 mg total) by mouth every 12 (twelve) hours.  Dispense: 60 tablet; Refill: 3  2. Tobacco dependency (Primary)  Continue- nicotine  (NICODERM CQ  - DOSED IN MG/24 HOURS) 14 mg/24hr patch; Place 1 patch (14 mg total) onto the skin daily.  Dispense: 28 patch; Refill: 3     Follow-up in 56-month Follow-up with therapy at [facility], NP 07/31/2023, 3:55 PM

## 2023-08-11 ENCOUNTER — Other Ambulatory Visit: Payer: Self-pay

## 2023-08-12 ENCOUNTER — Other Ambulatory Visit: Payer: Self-pay

## 2023-08-12 ENCOUNTER — Other Ambulatory Visit: Payer: Self-pay | Admitting: Family Medicine

## 2023-08-12 MED ORDER — OMEPRAZOLE 20 MG PO CPDR
20.0000 mg | DELAYED_RELEASE_CAPSULE | Freq: Two times a day (BID) | ORAL | 0 refills | Status: DC
  Filled 2023-08-12: qty 28, 14d supply, fill #0

## 2023-08-13 ENCOUNTER — Other Ambulatory Visit: Payer: Self-pay

## 2023-08-15 ENCOUNTER — Other Ambulatory Visit: Payer: Self-pay

## 2023-09-12 ENCOUNTER — Encounter (HOSPITAL_COMMUNITY): Payer: Self-pay

## 2023-09-13 ENCOUNTER — Other Ambulatory Visit: Payer: Self-pay | Admitting: Family Medicine

## 2023-09-15 ENCOUNTER — Other Ambulatory Visit: Payer: Self-pay

## 2023-09-15 MED ORDER — OMEPRAZOLE 20 MG PO CPDR
20.0000 mg | DELAYED_RELEASE_CAPSULE | Freq: Two times a day (BID) | ORAL | 0 refills | Status: DC
  Filled 2023-09-15: qty 28, 14d supply, fill #0

## 2023-09-16 ENCOUNTER — Other Ambulatory Visit: Payer: Self-pay

## 2023-09-18 ENCOUNTER — Other Ambulatory Visit: Payer: Self-pay

## 2023-10-01 ENCOUNTER — Encounter (HOSPITAL_COMMUNITY): Payer: Self-pay

## 2023-10-14 ENCOUNTER — Other Ambulatory Visit: Payer: Self-pay | Admitting: Family Medicine

## 2023-10-14 ENCOUNTER — Other Ambulatory Visit: Payer: Self-pay

## 2023-10-15 ENCOUNTER — Other Ambulatory Visit: Payer: Self-pay

## 2023-10-15 ENCOUNTER — Other Ambulatory Visit (HOSPITAL_COMMUNITY): Payer: Self-pay

## 2023-10-15 MED ORDER — OMEPRAZOLE 20 MG PO CPDR
20.0000 mg | DELAYED_RELEASE_CAPSULE | Freq: Two times a day (BID) | ORAL | 0 refills | Status: DC
  Filled 2023-10-15: qty 60, 30d supply, fill #0

## 2023-10-30 ENCOUNTER — Encounter (HOSPITAL_COMMUNITY): Payer: MEDICAID | Admitting: Psychiatry

## 2023-10-31 ENCOUNTER — Encounter (HOSPITAL_COMMUNITY): Payer: MEDICAID | Admitting: Psychiatry

## 2023-11-11 ENCOUNTER — Encounter (HOSPITAL_COMMUNITY): Payer: Self-pay | Admitting: Psychiatry

## 2023-11-11 ENCOUNTER — Ambulatory Visit (INDEPENDENT_AMBULATORY_CARE_PROVIDER_SITE_OTHER): Payer: MEDICAID | Admitting: Psychiatry

## 2023-11-11 ENCOUNTER — Other Ambulatory Visit: Payer: Self-pay

## 2023-11-11 DIAGNOSIS — F315 Bipolar disorder, current episode depressed, severe, with psychotic features: Secondary | ICD-10-CM | POA: Diagnosis not present

## 2023-11-11 MED ORDER — PALIPERIDONE ER 3 MG PO TB24
3.0000 mg | ORAL_TABLET | Freq: Every day | ORAL | 5 refills | Status: DC
  Filled 2023-11-11 – 2023-11-13 (×2): qty 30, 30d supply, fill #0
  Filled 2023-12-11: qty 30, 30d supply, fill #1
  Filled 2024-01-08: qty 30, 30d supply, fill #2
  Filled 2024-02-01: qty 30, 30d supply, fill #3

## 2023-11-11 MED ORDER — DIVALPROEX SODIUM 500 MG PO DR TAB
500.0000 mg | DELAYED_RELEASE_TABLET | Freq: Two times a day (BID) | ORAL | 5 refills | Status: DC
  Filled 2023-11-11 – 2023-11-13 (×2): qty 60, 30d supply, fill #0
  Filled 2023-12-11: qty 60, 30d supply, fill #1
  Filled 2024-02-01: qty 60, 30d supply, fill #2

## 2023-11-11 NOTE — Progress Notes (Signed)
 BH MD/PA/NP OP Progress Note       11/11/2023 3:48 PM Chloe [name]  MRN:  [id number]  Chief Complaint: I am appealing my disability     HPI: 58 year old female seen today for follow-up psychiatric evaluation.  She has a psychiatric history of bipolar affective disorder and tobacco dependence. She is currently managed on Depakote  500 mg twice daily, Nicoderm CQ  14 mg patches, and Invega  3 mg daily.  She reports that she discontinued her Nicoderm patch but reports that her other medications are effective in managing her psychiatric conditions.  Today she was well groomed, pleasant, cooperative, and engaged in conversation.  Patient informed Clinical research associate that she is stressed. She notes that her main position ended.  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  Patient informed Clinical research associate that she is trying to appeal her disability for the third time.  She notes that this has been difficult and is considering getting a lawyer but is financially stressed.  Patient reports that she cannot enlist the aid of her sister because she has had health issues and notes that her brother is undergoing chemotherapy.  Patient no longer lives in the homeless shelter but with her brother.    Since her last visit she notes that she continues to be somewhat anxious and depressed.  Today provider conducted a GAD-7 and patient scored a 16, at her last visit she scored a 15.  Provider also conducted PHQ-9 and patient scored a 13, at her last visit she scored a 19.  She reports that her sleep fluctuates due to above stressors.  She endorses having adequate appetite.  Today she denies SI/HI/AVH, mania, paranoia.   Patient reports that she continues to smoke a pack of cigarettes.  She notes that she wants to reduce her consumption as her brother has cancer.  She however does not wish to continue her Nicorette  patches.  Patient's Nicorette  patch was discontinued today.  She will continue medications as  prescribed.  Patient lives in [place] and will begin looking for providers in this area.  No other concerns noted at this time.       Visit Diagnosis:    ICD-10-CM   1. Bipolar affective disorder, depressed, severe, with psychotic behavior (HCC)  F31.5 paliperidone  (INVEGA ) 3 MG 24 hr tablet    divalproex  (DEPAKOTE ) 500 MG DR tablet           Past Psychiatric History: bipolar affective disorder and tobacco dependence, several psychiatric hospitalizations in the past. She was last admitted 25 November 2017 and was noted to have psychotic symptoms along with manic features during that hospital stay. She was discharged on risperidone , carbamazepine  and trazodone  at that time.  Past Medical History:  Past Medical History:  Diagnosis Date   Anxiety    Bipolar disorder (HCC)    Breast mass, right    GERD (gastroesophageal reflux disease)    Hyperlipidemia    Post-operative nausea and vomiting    Smoker     Past Surgical History:  Procedure Laterality Date   ABDOMINAL HYSTERECTOMY     had nausea and vomiting post-op   BREAST LUMPECTOMY WITH RADIOACTIVE SEED LOCALIZATION Right 11/12/2016   Procedure: RIGHT BREAST LUMPECTOMY WITH RADIOACTIVE SEED LOCALIZATION;  Surgeon: Vanderbilt Ned, MD;  Location: [facility];  Service: General;  Laterality: Right;   CHOLECYSTECTOMY     LAPAROSCOPIC LYSIS OF ADHESIONS     MYOMECTOMY     PLANTAR FASCIA SURGERY Left  Family Psychiatric History: Unknown  Family History:  Family History  Problem Relation Age of Onset   Lung cancer Mother    Breast cancer Maternal Aunt    Hyperlipidemia Brother    Colon cancer Neg Hx    Esophageal cancer Neg Hx    Stomach cancer Neg Hx     Social History:  Social History   Socioeconomic History   Marital status: Divorced    Spouse name: Not on file   Number of children: Not on file   Years of education: Not on file   Highest education level: Associate degree: occupational,  Scientist, product/process development, or vocational program  Occupational History   Not on file  Tobacco Use   Smoking status: Every Day    Current packs/day: 0.50    Types: Cigarettes   Smokeless tobacco: Never  Vaping Use   Vaping status: Never Used  Substance and Sexual Activity   Alcohol use: Not Currently    Comment: social   Drug use: No   Sexual activity: Not Currently  Other Topics Concern   Not on file  Social History Narrative   Not on file   Social Drivers of Health   Financial Resource Strain: High Risk (03/26/2022)   Overall Financial Resource Strain (CARDIA)    Difficulty of Paying Living Expenses: Hard  Food Insecurity: No Food Insecurity (01/09/2023)   Hunger Vital Sign    Worried About Running Out of Food in the Last Year: Never true    Ran Out of Food in the Last Year: Never true  Transportation Needs: No Transportation Needs (01/09/2023)   PRAPARE - Administrator, Civil Service (Medical): No    Lack of Transportation (Non-Medical): No  Physical Activity: Inactive (03/26/2022)   Exercise Vital Sign    Days of Exercise per Week: 0 days    Minutes of Exercise per Session: 0 min  Stress: Stress Concern Present (03/26/2022)   [employer] of Occupational Health - Occupational Stress Questionnaire    Feeling of Stress : Very much  Social Connections: Socially Isolated (03/26/2022)   Social Connection and Isolation Panel    Frequency of Communication with Friends and Family: Twice a week    Frequency of Social Gatherings with Friends and Family: Once a week    Attends Religious Services: Never    Database administrator or Organizations: No    Attends Banker Meetings: Never    Marital Status: Divorced    Allergies:  Allergies  Allergen Reactions   Penicillins Hives   Demerol  [Meperidine ]     Metabolic Disorder Labs: Lab Results  Component Value Date   HGBA1C 5.6 06/16/2023   MPG 108 10/01/2022   MPG 102.54 11/30/2017   Lab Results  Component  Value Date   PROLACTIN 16.4 06/16/2023   PROLACTIN 12.4 11/30/2017   Lab Results  Component Value Date   CHOL 154 06/16/2023   TRIG 165 (H) 06/16/2023   HDL 36 (L) 06/16/2023   CHOLHDL 4.3 06/16/2023   VLDL 19 10/01/2022   LDLCALC 89 06/16/2023   LDLCALC 93 10/01/2022   Lab Results  Component Value Date   TSH 1.420 06/16/2023   TSH 1.380 10/01/2022    Therapeutic Level Labs: No results found for: LITHIUM Lab Results  Component Value Date   VALPROATE 48 (L) 06/16/2023   Lab Results  Component Value Date   CBMZ 8.3 11/30/2017    Current Medications: Current Outpatient Medications  Medication Sig Dispense Refill  acetaminophen  (TYLENOL ) 500 MG tablet Take 500-1,000 mg by mouth every 6 (six) hours as needed for moderate pain (pain score 4-6).     albuterol  (PROVENTIL ) (2.5 MG/3ML) 0.083% nebulizer solution Take 3 mLs (2.5 mg total) by nebulization every 6 (six) hours as needed for wheezing or shortness of breath. 180 mL 1   albuterol  (VENTOLIN  HFA) 108 (90 Base) MCG/ACT inhaler Inhale 2 puffs into the lungs every 6 (six) hours as needed for wheezing or shortness of breath. 18 g 1   divalproex  (DEPAKOTE ) 500 MG DR tablet Take 1 tablet (500 mg total) by mouth every 12 (twelve) hours. 60 tablet 5   Fluticasone -Umeclidin-Vilant (TRELEGY ELLIPTA ) 100-62.5-25 MCG/ACT AEPB Inhale 1 puff into the lungs daily. 60 each 6   mupirocin  ointment (BACTROBAN ) 2 % Apply 1 Application topically 2 (two) times daily. To affected areas (Patient not taking: Reported on 05/21/2023) 22 g 0   omeprazole  (PRILOSEC) 20 MG capsule Take 1 capsule (20 mg total) by mouth 2 (two) times daily before a meal. 60 capsule 0   ondansetron  (ZOFRAN ) 4 MG tablet Take 1 tablet (4 mg total) by mouth every 6 (six) hours. 12 tablet 0   paliperidone  (INVEGA ) 3 MG 24 hr tablet Take 1 tablet (3 mg total) by mouth daily. 30 tablet 5   pravastatin  (PRAVACHOL ) 40 MG tablet Take 1 tablet (40 mg total) by mouth daily. 90 tablet  1   tolterodine  (DETROL  LA) 2 MG 24 hr capsule Take 1 capsule (2 mg total) by mouth daily. 30 capsule 11   Vibegron  (GEMTESA ) 75 MG TABS Take 1 tablet (75 mg total) by mouth daily.     No current facility-administered medications for this visit.     Musculoskeletal: Strength & Muscle Tone: within normal limits,  Gait & Station: normal,  Patient leans: N/A  Psychiatric Specialty Exam: Review of Systems  Blood pressure 130/87, pulse 96, weight 166 lb 12.8 oz (75.7 kg), SpO2 98%.Body mass index is 31.52 kg/m.  General Appearance: Well Groomed  Eye Contact:  Good  Speech:  Clear and Coherent and Pressured  Volume:  Normal  Mood:  Anxious and Depressed   Affect:  Appropriate and Congruent  Thought Process:  Coherent, Goal Directed, and Linear  Orientation:  Full (Time, Place, and Person)  Thought Content: WDL and Logical   Suicidal Thoughts:  No  Homicidal Thoughts:  No  Memory:  Immediate;   Good Recent;   Good Remote;   Good  Judgement:  Good  Insight:  Good  Psychomotor Activity:  Normal  Concentration:  Concentration: Good and Attention Span: Good  Recall:  Good  Fund of Knowledge: Good  Language: Good  Akathisia:  No  Handed:  Right  AIMS (if indicated): not done  Assets:  Communication Skills Desire for Improvement Housing Leisure Time Physical Health Social Support  ADL's:  Intact  Cognition: WNL  Sleep:  Fair   Screenings: AUDIT    Flowsheet Row Admission (Discharged) from 09/28/2022 in [facility]  Alcohol Use Disorder Identification Test Final Score (AUDIT) 0 0   GAD-7    Flowsheet Row Clinical Support from 11/11/2023 in [facility] Clinical Support from 07/31/2023 in [facility] Office Visit from 06/10/2023 in [facility] [place] [facility] [place]. [facility]  Clinical Support from 06/05/2023 in [facility] Clinical Support from 03/17/2023  in [facility]  Total GAD-7 Score 16 15 11 12 12    PHQ2-9    Flowsheet Row Clinical Support from 11/11/2023 in [facility] Clinical Support from 07/31/2023 in [facility] Office Visit from 06/10/2023 in [facility] [place] - [facility] Dept Of [place]. [facility] Clinical Support from 06/05/2023 in [facility] Clinical Support from 03/17/2023 in [facility]  PHQ-2 Total Score 3 3 2 1 2   PHQ-9 Total Score 13 19 8 3 9    Flowsheet Row ED from 02/24/2023 in [facility] at [facility] Clinical Support from 11/14/2022 in [facility] UC from 11/04/2022 in [facility] at [facility] RISK CATEGORY No Risk Error: Q2 is Yes, you must answer 3, 4, and 5 No Risk     Assessment and Plan: Patient reports that she has increased anxiety and depression due to life stressors.  She however feels that she is able to cope with it.  Patient reports that she continues to smoke a pack of cigarettes a day.  She however does not wish to continue her Nicorette  patch.  She currently lives in [place] and will be finding another provider in the area.   1. Bipolar affective disorder, depressed, severe, with psychotic behavior (HCC)  Continue- paliperidone  (INVEGA ) 3 MG 24 hr tablet; Take 1 tablet (3 mg total) by mouth daily.  Dispense: 30 tablet; Refill: 5 Continue- divalproex  (DEPAKOTE ) 500 MG DR tablet; Take 1 tablet (500 mg total) by mouth every 12 (twelve) hours.  Dispense: 60 tablet; Refill: 5   Follow-up in 46-month   Zane FORBES Bach, NP 11/11/2023, 3:48 PM

## 2023-11-13 ENCOUNTER — Other Ambulatory Visit (HOSPITAL_COMMUNITY): Payer: Self-pay

## 2023-11-13 ENCOUNTER — Other Ambulatory Visit: Payer: Self-pay

## 2023-11-17 ENCOUNTER — Other Ambulatory Visit: Payer: Self-pay | Admitting: Family Medicine

## 2023-11-18 ENCOUNTER — Other Ambulatory Visit: Payer: Self-pay

## 2023-11-18 MED ORDER — OMEPRAZOLE 20 MG PO CPDR
20.0000 mg | DELAYED_RELEASE_CAPSULE | Freq: Two times a day (BID) | ORAL | 0 refills | Status: DC
  Filled 2023-11-18: qty 60, 30d supply, fill #0

## 2023-11-20 ENCOUNTER — Other Ambulatory Visit: Payer: Self-pay

## 2023-11-20 ENCOUNTER — Ambulatory Visit: Payer: MEDICAID | Admitting: Urology

## 2023-11-25 ENCOUNTER — Telehealth (HOSPITAL_COMMUNITY): Payer: Self-pay | Admitting: Psychiatry

## 2023-11-25 NOTE — Telephone Encounter (Signed)
 Referral placed by administrative staff. Patient made aware.

## 2023-11-25 NOTE — Telephone Encounter (Signed)
 Patient called to speak with Dr. Harl about a referral to the [facility] location. Best number to reach patient is [phone]

## 2023-12-03 ENCOUNTER — Ambulatory Visit: Payer: MEDICAID | Admitting: Urology

## 2023-12-11 ENCOUNTER — Other Ambulatory Visit: Payer: Self-pay | Admitting: Family Medicine

## 2023-12-12 ENCOUNTER — Other Ambulatory Visit: Payer: Self-pay

## 2023-12-13 ENCOUNTER — Other Ambulatory Visit (HOSPITAL_COMMUNITY): Payer: Self-pay

## 2023-12-13 MED ORDER — OMEPRAZOLE 20 MG PO CPDR
20.0000 mg | DELAYED_RELEASE_CAPSULE | Freq: Two times a day (BID) | ORAL | 0 refills | Status: AC
  Filled 2023-12-13: qty 180, 90d supply, fill #0

## 2023-12-13 NOTE — Telephone Encounter (Signed)
 Requested Prescriptions  Pending Prescriptions Disp Refills   omeprazole  (PRILOSEC) 20 MG capsule 180 capsule 0    Sig: Take 1 capsule (20 mg total) by mouth 2 (two) times daily before a meal.     Gastroenterology: Proton Pump Inhibitors Passed - 12/13/2023  7:47 AM      Passed - Valid encounter within last 12 months    Recent Outpatient Visits           6 months ago Overactive bladder   [facility] [place] [facility] [place]. [facility] Delbert Clam, MD   9 months ago Chronic obstructive pulmonary disease with acute exacerbation [facility])   [place]  [facility] [place] [facility] [place]. [facility] Delbert Clam, MD   11 months ago Chronic obstructive pulmonary disease with acute exacerbation [facility])   [place] [facility] [place] [place]. [facility] Theotis Haze ORN, NP   1 year ago Cellulitis of abdominal wall   [place] [facility] [place] [facility] Of [place]. [facility] Theotis Haze ORN, NP   1 year ago COPD with chronic bronchitis [facility])   [place] [facility] [place]. [facility] Brien Belvie BRAVO, MD       Future Appointments             In 3 months McKenzie, Belvie CROME, MD [facility] [place]

## 2024-01-27 ENCOUNTER — Telehealth (INDEPENDENT_AMBULATORY_CARE_PROVIDER_SITE_OTHER): Payer: Self-pay

## 2024-01-27 ENCOUNTER — Encounter (INDEPENDENT_AMBULATORY_CARE_PROVIDER_SITE_OTHER): Payer: Self-pay | Admitting: Primary Care

## 2024-01-27 ENCOUNTER — Ambulatory Visit (INDEPENDENT_AMBULATORY_CARE_PROVIDER_SITE_OTHER): Payer: MEDICAID | Admitting: Primary Care

## 2024-01-27 VITALS — BP 132/85 | HR 95 | Resp 16 | Ht 61.0 in | Wt 167.4 lb

## 2024-01-27 DIAGNOSIS — Z7689 Persons encountering health services in other specified circumstances: Secondary | ICD-10-CM

## 2024-01-27 DIAGNOSIS — J4489 Other specified chronic obstructive pulmonary disease: Secondary | ICD-10-CM

## 2024-01-27 DIAGNOSIS — Z23 Encounter for immunization: Secondary | ICD-10-CM

## 2024-01-27 DIAGNOSIS — F315 Bipolar disorder, current episode depressed, severe, with psychotic features: Secondary | ICD-10-CM | POA: Diagnosis not present

## 2024-01-27 DIAGNOSIS — N3281 Overactive bladder: Secondary | ICD-10-CM | POA: Diagnosis not present

## 2024-01-27 DIAGNOSIS — Z79899 Other long term (current) drug therapy: Secondary | ICD-10-CM

## 2024-01-27 DIAGNOSIS — F17200 Nicotine dependence, unspecified, uncomplicated: Secondary | ICD-10-CM

## 2024-01-27 DIAGNOSIS — Z1231 Encounter for screening mammogram for malignant neoplasm of breast: Secondary | ICD-10-CM

## 2024-01-27 NOTE — Telephone Encounter (Signed)
 Marland Kitchen  mychart

## 2024-01-27 NOTE — Patient Instructions (Signed)

## 2024-01-27 NOTE — Progress Notes (Unsigned)
 [facility]  Chloe [name], is a 58 y.o. female  RDW:[id number]  FMW:[id number]  DOB - September 30, 1965 Requesting referral to psyche       Subjective:   Chloe [name] is a 58 y.o. female here today for a acute visit.  She is currently living with her brother in [place].  Previously homeless and had a sister-in-law that was helping her manage her affairs that has recently passed.  She found out she was denied disability and would like to have some help with appealing case.  This has previously been appealed but due to losing her job after 17 years her car and her home there is no money for determining.  Tried to reach clinical nurse manager Chloe [name] and set her up with legal aid which is a free legal counseling that helps patient in these type of situations.  She is currently followed by behavioral health in [place] which is been difficult to keep appointments since she has moved.  Has No headache.  Okay No problems updated.  Comprehensive ROS Pertinent positive and negative noted in HPI   Allergies  Allergen Reactions   Penicillins Hives   Demerol  [Meperidine ]     Past Medical History:  Diagnosis Date   Anxiety    Bipolar disorder (HCC)    Breast mass, right    GERD (gastroesophageal reflux disease)    Hyperlipidemia    Post-operative nausea and vomiting    Smoker     Current Outpatient Medications on File Prior to Visit  Medication Sig Dispense Refill   acetaminophen  (TYLENOL ) 500 MG tablet Take 500-1,000 mg by mouth every 6 (six) hours as needed for moderate pain (pain score 4-6).     albuterol  (PROVENTIL ) (2.5 MG/3ML) 0.083% nebulizer solution Take 3 mLs (2.5 mg total) by nebulization every 6 (six) hours as needed for wheezing or shortness of breath. 180 mL 1   albuterol  (VENTOLIN  HFA) 108 (90 Base) MCG/ACT inhaler Inhale 2 puffs into the lungs every 6 (six) hours as needed for wheezing or shortness of breath. 18 g 1   divalproex  (DEPAKOTE ) 500 MG DR tablet  Take 1 tablet (500 mg total) by mouth every 12 (twelve) hours. 60 tablet 5   Fluticasone -Umeclidin-Vilant (TRELEGY ELLIPTA ) 100-62.5-25 MCG/ACT AEPB Inhale 1 puff into the lungs daily. 60 each 6   mupirocin  ointment (BACTROBAN ) 2 % Apply 1 Application topically 2 (two) times daily. To affected areas (Patient not taking: Reported on 05/21/2023) 22 g 0   omeprazole  (PRILOSEC) 20 MG capsule Take 1 capsule (20 mg total) by mouth 2 (two) times daily before a meal. 180 capsule 0   ondansetron  (ZOFRAN ) 4 MG tablet Take 1 tablet (4 mg total) by mouth every 6 (six) hours. 12 tablet 0   paliperidone  (INVEGA ) 3 MG 24 hr tablet Take 1 tablet (3 mg total) by mouth daily. 30 tablet 5   pravastatin  (PRAVACHOL ) 40 MG tablet Take 1 tablet (40 mg total) by mouth daily. 90 tablet 1   tolterodine  (DETROL  LA) 2 MG 24 hr capsule Take 1 capsule (2 mg total) by mouth daily. 30 capsule 11   Vibegron  (GEMTESA ) 75 MG TABS Take 1 tablet (75 mg total) by mouth daily.     No current facility-administered medications on file prior to visit.   Health Maintenance  Topic Date Due   Hepatitis B Vaccine (1 of 3 - 19+ 3-dose series) Never done   COVID-19 Vaccine (3 - Pfizer risk series) 07/26/2019   Flu Shot  09/19/2023  Breast Cancer Screening  10/17/2023   Screening for Lung Cancer  03/27/2024   Colon Cancer Screening  08/29/2027   DTaP/Tdap/Td vaccine (2 - Td or Tdap) 07/20/2030   Pneumococcal Vaccine for age over 61  Completed   Hepatitis C Screening  Completed   HIV Screening  Completed   Zoster (Shingles) Vaccine  Completed   HPV Vaccine  Aged Out   Meningitis B Vaccine  Aged Out    Objective:  BP 132/85   Pulse 95   Resp 16   Ht 5' 1 (1.549 m)   Wt 167 lb 6.4 oz (75.9 kg)   SpO2 95%   BMI 31.63 kg/m   Physical Exam Vitals reviewed.  Constitutional:      Appearance: Normal appearance. She is obese.  HENT:     Head: Normocephalic.     Right Ear: Tympanic membrane, ear canal and external ear normal.      Left Ear: Tympanic membrane, ear canal and external ear normal.     Nose: Nose normal.     Mouth/Throat:     Mouth: Mucous membranes are moist.  Eyes:     Extraocular Movements: Extraocular movements intact.     Pupils: Pupils are equal, round, and reactive to light.  Cardiovascular:     Rate and Rhythm: Normal rate.  Pulmonary:     Effort: Pulmonary effort is normal.     Breath sounds: Normal breath sounds.  Abdominal:     General: Bowel sounds are normal.     Palpations: Abdomen is soft.  Musculoskeletal:        General: Normal range of motion.     Cervical back: Normal range of motion.  Skin:    General: Skin is warm and dry.  Neurological:     Mental Status: She is alert and oriented to person, place, and time.  Psychiatric:        Mood and Affect: Mood normal.        Behavior: Behavior normal.        Thought Content: Thought content normal.     Assessment & Plan    Patient have been counseled extensively about nutrition and exercise. Other issues discussed during this visit include: low cholesterol diet, weight control and daily exercise, foot care, annual eye examinations at Ophthalmology, importance of adherence with medications and regular follow-up. We also discussed long term complications of uncontrolled diabetes and hypertension.   No follow-ups on file.  The patient was given clear instructions to go to ER or return to medical center if symptoms don't improve, worsen or new problems develop. The patient verbalized understanding. The patient was told to call to get lab results if they haven't heard anything in the next week.   This note has been created with Education officer, environmental. Any transcriptional errors are unintentional.   Chloe SHAUNNA Bohr, NP 01/27/2024, 10:41 AM

## 2024-01-28 LAB — CMP14+EGFR
ALT: 21 IU/L (ref 0–32)
AST: 21 IU/L (ref 0–40)
Albumin: 4.6 g/dL (ref 3.8–4.9)
Alkaline Phosphatase: 115 IU/L (ref 49–135)
BUN/Creatinine Ratio: 15 (ref 9–23)
BUN: 7 mg/dL (ref 6–24)
Bilirubin Total: 0.4 mg/dL (ref 0.0–1.2)
CO2: 23 mmol/L (ref 20–29)
Calcium: 9.5 mg/dL (ref 8.7–10.2)
Chloride: 97 mmol/L (ref 96–106)
Creatinine, Ser: 0.48 mg/dL — ABNORMAL LOW (ref 0.57–1.00)
Globulin, Total: 2.9 g/dL (ref 1.5–4.5)
Glucose: 47 mg/dL — ABNORMAL LOW (ref 70–99)
Potassium: 4.2 mmol/L (ref 3.5–5.2)
Sodium: 143 mmol/L (ref 134–144)
Total Protein: 7.5 g/dL (ref 6.0–8.5)
eGFR: 110 mL/min/1.73 (ref >59)

## 2024-01-28 LAB — LIPID PANEL
Chol/HDL Ratio: 4.8 ratio — ABNORMAL HIGH (ref 0.0–4.4)
Cholesterol, Total: 197 mg/dL (ref 100–199)
HDL: 41 mg/dL (ref >39)
LDL Chol Calc (NIH): 126 mg/dL — ABNORMAL HIGH (ref 0–99)
Triglycerides: 169 mg/dL — ABNORMAL HIGH (ref 0–149)
VLDL Cholesterol Cal: 30 mg/dL (ref 5–40)

## 2024-01-28 LAB — CBC WITH DIFFERENTIAL/PLATELET
Basophils Absolute: 0.1 x10E3/uL (ref 0.0–0.2)
Basos: 1 %
EOS (ABSOLUTE): 0.1 x10E3/uL (ref 0.0–0.4)
Eos: 1 %
Hematocrit: 50.2 % — ABNORMAL HIGH (ref 34.0–46.6)
Hemoglobin: 16.9 g/dL — ABNORMAL HIGH (ref 11.1–15.9)
Immature Grans (Abs): 0.1 x10E3/uL (ref 0.0–0.1)
Immature Granulocytes: 1 %
Lymphocytes Absolute: 3.7 x10E3/uL — ABNORMAL HIGH (ref 0.7–3.1)
Lymphs: 31 %
MCH: 31.5 pg (ref 26.6–33.0)
MCHC: 33.7 g/dL (ref 31.5–35.7)
MCV: 94 fL (ref 79–97)
Monocytes Absolute: 1.1 x10E3/uL — ABNORMAL HIGH (ref 0.1–0.9)
Monocytes: 9 %
Neutrophils Absolute: 6.7 x10E3/uL (ref 1.4–7.0)
Neutrophils: 57 %
Platelets: 357 x10E3/uL (ref 150–450)
RBC: 5.37 x10E6/uL — ABNORMAL HIGH (ref 3.77–5.28)
RDW: 12.9 % (ref 11.7–15.4)
WBC: 11.8 x10E3/uL — ABNORMAL HIGH (ref 3.4–10.8)

## 2024-02-01 ENCOUNTER — Other Ambulatory Visit: Payer: Self-pay | Admitting: Family Medicine

## 2024-02-01 DIAGNOSIS — E782 Mixed hyperlipidemia: Secondary | ICD-10-CM

## 2024-02-01 MED ORDER — PRAVASTATIN SODIUM 40 MG PO TABS
40.0000 mg | ORAL_TABLET | Freq: Every day | ORAL | 1 refills | Status: AC
  Filled 2024-02-01 – 2024-03-09 (×2): qty 90, 90d supply, fill #0

## 2024-02-02 ENCOUNTER — Other Ambulatory Visit: Payer: Self-pay

## 2024-02-03 ENCOUNTER — Inpatient Hospital Stay
Admission: RE | Admit: 2024-02-03 | Discharge: 2024-02-03 | Disposition: A | Payer: MEDICAID | Source: Ambulatory Visit | Attending: Family Medicine | Admitting: Family Medicine

## 2024-02-03 ENCOUNTER — Other Ambulatory Visit (HOSPITAL_COMMUNITY): Payer: Self-pay

## 2024-02-03 DIAGNOSIS — Z1231 Encounter for screening mammogram for malignant neoplasm of breast: Secondary | ICD-10-CM

## 2024-02-08 ENCOUNTER — Ambulatory Visit (INDEPENDENT_AMBULATORY_CARE_PROVIDER_SITE_OTHER): Payer: Self-pay | Admitting: Primary Care

## 2024-02-10 ENCOUNTER — Other Ambulatory Visit: Payer: Self-pay

## 2024-02-10 ENCOUNTER — Ambulatory Visit (HOSPITAL_COMMUNITY): Payer: MEDICAID | Admitting: Psychiatry

## 2024-02-10 DIAGNOSIS — F315 Bipolar disorder, current episode depressed, severe, with psychotic features: Secondary | ICD-10-CM

## 2024-02-10 MED ORDER — DIVALPROEX SODIUM 500 MG PO DR TAB
500.0000 mg | DELAYED_RELEASE_TABLET | Freq: Two times a day (BID) | ORAL | 4 refills | Status: AC
  Filled 2024-02-10 – 2024-03-09 (×2): qty 60, 30d supply, fill #0

## 2024-02-10 MED ORDER — PALIPERIDONE ER 3 MG PO TB24
3.0000 mg | ORAL_TABLET | Freq: Every day | ORAL | 4 refills | Status: AC
  Filled 2024-02-10 – 2024-03-09 (×2): qty 30, 30d supply, fill #0

## 2024-02-10 NOTE — Progress Notes (Signed)
 BH MD/PA/NP OP Progress Note       02/10/2024 1:33 PM Chloe [name]  MRN:  [id number]  Chief Complaint: I worry about my future     HPI: 58 year old female seen today for follow-up psychiatric evaluation.  She has a psychiatric history of bipolar affective disorder and tobacco dependence. She is currently managed on Depakote  500 mg twice daily and Invega  3 mg daily.  She reports that her medications are effective in managing her psychiatric conditions.  Today she was well groomed, pleasant, cooperative, and engaged in conversation.  Patient informed clinical research associate that she worries about her future. She notes that her brother has cancer and her sister has a stroke. She informed clinical research associate that she is unsure where she would live if her brother passes. She notes that she can't stay with her sister. Patient notes that she continues to appeal her disability. 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  Since her last visit she notes that she continues to be somewhat anxious and depressed.  Today provider conducted a GAD-7 and patient scored a 19, at her last visit she scored a 16.  Provider also conducted PHQ-9 and patient scored a 20, at her last visit she scored a 13.  She endorses adequate sleep and increase appetite. Patient has report hat she has gained 10 pounds since her last visit. Patient endorses passive SI but denies wanting to harm herself.  Today she denies SI/HI/AVH, mania, paranoia.   Patient informed that the above is situational and requested her medications not be adjusted.  No medication changes made today.  Patient agreeable to continue medication prescribed.  Patient will receive services from [facility] behavioral health as she is now living in this area.   No other concerns noted at this time.       Visit Diagnosis:    ICD-10-CM   1. Bipolar affective disorder, depressed, severe, with psychotic  behavior (HCC)  F31.5 divalproex  (DEPAKOTE ) 500 MG DR tablet    paliperidone  (INVEGA ) 3 MG 24 hr tablet            Past Psychiatric History: bipolar affective disorder and tobacco dependence, several psychiatric hospitalizations in the past. She was last admitted 25 November 2017 and was noted to have psychotic symptoms along with manic features during that hospital stay. She was discharged on risperidone , carbamazepine  and trazodone  at that time.  Past Medical History:  Past Medical History:  Diagnosis Date   Anxiety    Bipolar disorder (HCC)    Breast mass, right    GERD (gastroesophageal reflux disease)    Hyperlipidemia    Post-operative nausea and vomiting    Smoker     Past Surgical History:  Procedure Laterality Date   ABDOMINAL HYSTERECTOMY     had nausea and vomiting post-op   BREAST LUMPECTOMY WITH RADIOACTIVE SEED LOCALIZATION Right 11/12/2016   Procedure: RIGHT BREAST LUMPECTOMY WITH RADIOACTIVE SEED LOCALIZATION;  Surgeon: Vanderbilt Ned, MD;  Location: [facility];  Service: General;  Laterality: Right;   CHOLECYSTECTOMY     LAPAROSCOPIC LYSIS OF ADHESIONS     MYOMECTOMY     PLANTAR FASCIA SURGERY Left     Family Psychiatric History: Unknown  Family History:  Family History  Problem Relation Age of Onset   Lung cancer Mother    Breast cancer Maternal Aunt    Hyperlipidemia Brother    Colon cancer Neg Hx  Esophageal cancer Neg Hx    Stomach cancer Neg Hx     Social History:  Social History   Socioeconomic History   Marital status: Divorced    Spouse name: Not on file   Number of children: Not on file   Years of education: Not on file   Highest education level: Some college, no degree  Occupational History   Not on file  Tobacco Use   Smoking status: Every Day    Current packs/day: 0.50    Types: Cigarettes   Smokeless tobacco: Never  Vaping Use   Vaping status: Never Used  Substance and Sexual Activity   Alcohol use: Not  Currently    Comment: social   Drug use: No   Sexual activity: Not Currently  Other Topics Concern   Not on file  Social History Narrative   Not on file   Social Drivers of Health   Tobacco Use: High Risk (01/27/2024)   Patient History    Smoking Tobacco Use: Every Day    Smokeless Tobacco Use: Never    Passive Exposure: Not on file  Financial Resource Strain: Medium Risk (01/23/2024)   Overall Financial Resource Strain (CARDIA)    Difficulty of Paying Living Expenses: Somewhat hard  Food Insecurity: Food Insecurity Present (01/23/2024)   Epic    Worried About Programme Researcher, Broadcasting/film/video in the Last Year: Sometimes true    Ran Out of Food in the Last Year: Sometimes true  Transportation Needs: No Transportation Needs (01/23/2024)   Epic    Lack of Transportation (Medical): No    Lack of Transportation (Non-Medical): No  Physical Activity: Inactive (01/23/2024)   Exercise Vital Sign    Days of Exercise per Week: 0 days    Minutes of Exercise per Session: Not on file  Stress: Stress Concern Present (01/23/2024)   [employer] of Occupational Health - Occupational Stress Questionnaire    Feeling of Stress: Very much  Social Connections: Moderately Isolated (01/23/2024)   Social Connection and Isolation Panel    Frequency of Communication with Friends and Family: Never    Frequency of Social Gatherings with Friends and Family: Three times a week    Attends Religious Services: 1 to 4 times per year    Active Member of Clubs or Organizations: No    Attends Banker Meetings: Not on file    Marital Status: Divorced  Depression (PHQ2-9): High Risk (02/10/2024)   Depression (PHQ2-9)    PHQ-2 Score: 18  Alcohol Screen: Low Risk (09/29/2022)   Alcohol Screen    Last Alcohol Screening Score (AUDIT): 0  Housing: High Risk (01/23/2024)   Epic    Unable to Pay for Housing in the Last Year: Patient declined    Number of Times Moved in the Last Year: 2    Homeless in the Last Year:  Yes  Utilities: Not At Risk (01/09/2023)   AHC Utilities    Threatened with loss of utilities: No  Health Literacy: Adequate Health Literacy (01/06/2023)   B1300 Health Literacy    Frequency of need for help with medical instructions: Never    Allergies:  Allergies  Allergen Reactions   Penicillins Hives   Demerol  [Meperidine ]     Metabolic Disorder Labs: Lab Results  Component Value Date   HGBA1C 5.6 06/16/2023   MPG 108 10/01/2022   MPG 102.54 11/30/2017   Lab Results  Component Value Date   PROLACTIN 16.4 06/16/2023   PROLACTIN 12.4 11/30/2017   Lab  Results  Component Value Date   CHOL 197 01/27/2024   TRIG 169 (H) 01/27/2024   HDL 41 01/27/2024   CHOLHDL 4.8 (H) 01/27/2024   VLDL 19 10/01/2022   LDLCALC 126 (H) 01/27/2024   LDLCALC 89 06/16/2023   Lab Results  Component Value Date   TSH 1.420 06/16/2023   TSH 1.380 10/01/2022    Therapeutic Level Labs: No results found for: LITHIUM Lab Results  Component Value Date   VALPROATE 48 (L) 06/16/2023   Lab Results  Component Value Date   CBMZ 8.3 11/30/2017    Current Medications: Current Outpatient Medications  Medication Sig Dispense Refill   albuterol  (VENTOLIN  HFA) 108 (90 Base) MCG/ACT inhaler Inhale 2 puffs into the lungs every 6 (six) hours as needed for wheezing or shortness of breath. 18 g 1   divalproex  (DEPAKOTE ) 500 MG DR tablet Take 1 tablet (500 mg total) by mouth every 12 (twelve) hours. 60 tablet 4   Fluticasone -Umeclidin-Vilant (TRELEGY ELLIPTA ) 100-62.5-25 MCG/ACT AEPB Inhale 1 puff into the lungs daily. 60 each 6   omeprazole  (PRILOSEC) 20 MG capsule Take 1 capsule (20 mg total) by mouth 2 (two) times daily before a meal. 180 capsule 0   paliperidone  (INVEGA ) 3 MG 24 hr tablet Take 1 tablet (3 mg total) by mouth daily. 30 tablet 4   pravastatin  (PRAVACHOL ) 40 MG tablet Take 1 tablet (40 mg total) by mouth daily. 90 tablet 1   tolterodine  (DETROL  LA) 2 MG 24 hr capsule Take 1 capsule  (2 mg total) by mouth daily. 30 capsule 11   No current facility-administered medications for this visit.     Musculoskeletal: Strength & Muscle Tone: within normal limits,  Gait & Station: normal,  Patient leans: N/A  Psychiatric Specialty Exam: Review of Systems  There were no vitals taken for this visit.There is no height or weight on file to calculate BMI.  General Appearance: Well Groomed  Eye Contact:  Good  Speech:  Clear and Coherent and Pressured  Volume:  Normal  Mood:  Anxious and Depressed   Affect:  Appropriate and Congruent  Thought Process:  Coherent, Goal Directed, and Linear  Orientation:  Full (Time, Place, and Person)  Thought Content: WDL and Logical   Suicidal Thoughts:  No  Homicidal Thoughts:  No  Memory:  Immediate;   Good Recent;   Good Remote;   Good  Judgement:  Good  Insight:  Good  Psychomotor Activity:  Normal  Concentration:  Concentration: Good and Attention Span: Good  Recall:  Good  Fund of Knowledge: Good  Language: Good  Akathisia:  No  Handed:  Right  AIMS (if indicated): not done  Assets:  Communication Skills Desire for Improvement Housing Leisure Time Physical Health Social Support  ADL's:  Intact  Cognition: WNL  Sleep:  Good   Screenings: AIMS    Flowsheet Row Clinical Support from 02/10/2024 in [place]  AIMS Total Score 2   AUDIT    Flowsheet Row Admission (Discharged) from [facility]/20[facility] in [facility]  Alcohol Use Disorder Identification Test Final Score (AUDIT) 0 0   GAD-7    Flowsheet Row Clinical Support from 02/10/2024 in [facility] Office Visit from 01/27/2024 in [facility] Clinical Support from 11/11/2023 in [facility] Clinical Support from 07/31/2023 in [facility] Office Visit from 06/10/2023  in [facility] [place] [facility] [place]  Total GAD-7 Score 17 19 16 15 11    PHQ2-9    Flowsheet Row Clinical Support from 02/10/2024 in [facility] Office Visit from 01/27/2024 in [facility] Clinical Support from 11/11/2023 in [facility] Clinical Support from 07/31/2023 in [facility] Office Visit from 06/10/2023 in [facility] [place] [facility] [place]. [facility]  PHQ-2 Total Score 6 3 3 3 2   PHQ-9 Total Score 18 -- 13 19 8    Flowsheet Row ED from 02/24/2023 in [facility] at [facility] Clinical Support from 11/14/2022 in [facility] UC from 11/04/2022 in [facility] RISK CATEGORY No Risk Error: Q2 is Yes, you must answer 3, 4, and 5 No Risk     Assessment and Plan: Patient reports that she has increased anxiety and depression due to life stressors.  She reports that her stressors are situational and requested her medications not be adjusted.  No medication changes made today.  Patient agreeable to continue medication as prescribed.    1. Bipolar affective disorder, depressed, severe, with psychotic behavior (HCC)  Continue- paliperidone  (INVEGA ) 3 MG 24 hr tablet; Take 1 tablet (3 mg total) by mouth daily.  Dispense: 30 tablet; Refill: 4 Continue- divalproex  (DEPAKOTE ) 500 MG DR tablet; Take 1 tablet (500 mg total) by mouth every 12 (twelve) hours.  Dispense: 60 tablet; Refill: 4   Follow-up as needed   Zane FORBES Bach, NP 02/10/2024, 1:33 PM

## 2024-02-23 ENCOUNTER — Other Ambulatory Visit: Payer: Self-pay

## 2024-02-23 ENCOUNTER — Telehealth: Payer: Self-pay

## 2024-02-23 NOTE — Telephone Encounter (Signed)
 Pharmacy Patient Advocate Encounter   Received notification from CoverMyMeds that prior authorization for TRELEGY is required/requested.   Insurance verification completed.   The patient is insured through [employer].   Per test claim: PA required; PA submitted to above mentioned insurance via CoverMyMeds Key/confirmation #/EOC [id number] Status is pending

## 2024-02-25 ENCOUNTER — Other Ambulatory Visit: Payer: Self-pay

## 2024-02-25 ENCOUNTER — Telehealth: Payer: Self-pay

## 2024-02-25 NOTE — Telephone Encounter (Signed)
 Pharmacy Patient Advocate Encounter  Received notification from [facility] that Prior Authorization for TRELEGY has been APPROVED from 02/23/2024 to 02/22/2025   PA #/Case ID/Reference #: [id number]

## 2024-03-02 ENCOUNTER — Telehealth (INDEPENDENT_AMBULATORY_CARE_PROVIDER_SITE_OTHER): Payer: Self-pay | Admitting: Primary Care

## 2024-03-02 NOTE — Telephone Encounter (Signed)
 Spoke to pt about upcoming appt.. Will be present

## 2024-03-09 ENCOUNTER — Other Ambulatory Visit: Payer: Self-pay

## 2024-03-10 ENCOUNTER — Ambulatory Visit (INDEPENDENT_AMBULATORY_CARE_PROVIDER_SITE_OTHER): Payer: MEDICAID | Admitting: Primary Care

## 2024-03-15 ENCOUNTER — Ambulatory Visit (HOSPITAL_COMMUNITY): Payer: MEDICAID

## 2024-03-22 ENCOUNTER — Ambulatory Visit (HOSPITAL_COMMUNITY): Payer: MEDICAID

## 2024-03-29 ENCOUNTER — Ambulatory Visit: Payer: MEDICAID | Admitting: Urology

## 2024-04-12 ENCOUNTER — Ambulatory Visit (HOSPITAL_COMMUNITY): Payer: MEDICAID

## 2024-05-17 ENCOUNTER — Ambulatory Visit (HOSPITAL_COMMUNITY): Payer: MEDICAID | Admitting: Registered Nurse
